# Patient Record
Sex: Male | Born: 1935 | Race: White | Hispanic: No | Smoking: Never smoker
Health system: Southern US, Community
[De-identification: ages and names within clinical notes are randomized; demographics above are authoritative.]

## PROBLEM LIST (undated history)

## (undated) DIAGNOSIS — R1310 Dysphagia, unspecified: Secondary | ICD-10-CM

## (undated) DIAGNOSIS — N182 Chronic kidney disease, stage 2 (mild): Secondary | ICD-10-CM

## (undated) DIAGNOSIS — K219 Gastro-esophageal reflux disease without esophagitis: Secondary | ICD-10-CM

## (undated) DIAGNOSIS — F32A Depression, unspecified: Secondary | ICD-10-CM

## (undated) DIAGNOSIS — I2510 Atherosclerotic heart disease of native coronary artery without angina pectoris: Secondary | ICD-10-CM

## (undated) DIAGNOSIS — F419 Anxiety disorder, unspecified: Secondary | ICD-10-CM

## (undated) DIAGNOSIS — I679 Cerebrovascular disease, unspecified: Secondary | ICD-10-CM

## (undated) DIAGNOSIS — H3530 Unspecified macular degeneration: Secondary | ICD-10-CM

## (undated) DIAGNOSIS — F0150 Vascular dementia without behavioral disturbance: Secondary | ICD-10-CM

## (undated) DIAGNOSIS — N400 Enlarged prostate without lower urinary tract symptoms: Secondary | ICD-10-CM

## (undated) DIAGNOSIS — I639 Cerebral infarction, unspecified: Secondary | ICD-10-CM

## (undated) DIAGNOSIS — E785 Hyperlipidemia, unspecified: Secondary | ICD-10-CM

## (undated) DIAGNOSIS — I10 Essential (primary) hypertension: Secondary | ICD-10-CM

## (undated) DIAGNOSIS — F329 Major depressive disorder, single episode, unspecified: Secondary | ICD-10-CM

## (undated) HISTORY — DX: Gastro-esophageal reflux disease without esophagitis: K21.9

## (undated) HISTORY — PX: TONSILLECTOMY: SUR1361

## (undated) HISTORY — PX: CORONARY ARTERY BYPASS GRAFT: SHX141

## (undated) HISTORY — DX: Cerebrovascular disease, unspecified: I67.9

## (undated) HISTORY — PX: CARDIAC SURGERY: SHX584

## (undated) HISTORY — DX: Unspecified macular degeneration: H35.30

## (undated) HISTORY — DX: Atherosclerotic heart disease of native coronary artery without angina pectoris: I25.10

## (undated) HISTORY — DX: Cerebral infarction, unspecified: I63.9

## (undated) HISTORY — DX: Vascular dementia, unspecified severity, without behavioral disturbance, psychotic disturbance, mood disturbance, and anxiety: F01.50

## (undated) HISTORY — DX: Chronic kidney disease, stage 2 (mild): N18.2

## (undated) HISTORY — DX: Essential (primary) hypertension: I10

## (undated) HISTORY — DX: Hyperlipidemia, unspecified: E78.5

## (undated) HISTORY — PX: HERNIA REPAIR: SHX51

---

## 1980-03-07 DIAGNOSIS — I2510 Atherosclerotic heart disease of native coronary artery without angina pectoris: Secondary | ICD-10-CM

## 1980-03-07 HISTORY — DX: Atherosclerotic heart disease of native coronary artery without angina pectoris: I25.10

## 2003-03-08 DIAGNOSIS — I679 Cerebrovascular disease, unspecified: Secondary | ICD-10-CM

## 2003-03-08 HISTORY — DX: Cerebrovascular disease, unspecified: I67.9

## 2005-11-16 ENCOUNTER — Encounter (INDEPENDENT_AMBULATORY_CARE_PROVIDER_SITE_OTHER): Payer: Self-pay | Admitting: Internal Medicine

## 2007-10-25 ENCOUNTER — Emergency Department (HOSPITAL_COMMUNITY): Admission: EM | Admit: 2007-10-25 | Discharge: 2007-10-25 | Payer: Self-pay | Admitting: Emergency Medicine

## 2007-11-01 ENCOUNTER — Ambulatory Visit: Payer: Self-pay | Admitting: Internal Medicine

## 2007-11-01 DIAGNOSIS — N400 Enlarged prostate without lower urinary tract symptoms: Secondary | ICD-10-CM

## 2007-11-01 DIAGNOSIS — H269 Unspecified cataract: Secondary | ICD-10-CM | POA: Insufficient documentation

## 2007-11-01 DIAGNOSIS — I252 Old myocardial infarction: Secondary | ICD-10-CM | POA: Insufficient documentation

## 2007-11-01 DIAGNOSIS — E785 Hyperlipidemia, unspecified: Secondary | ICD-10-CM | POA: Insufficient documentation

## 2007-11-01 DIAGNOSIS — K5900 Constipation, unspecified: Secondary | ICD-10-CM | POA: Insufficient documentation

## 2007-11-01 DIAGNOSIS — I10 Essential (primary) hypertension: Secondary | ICD-10-CM | POA: Insufficient documentation

## 2008-02-06 ENCOUNTER — Ambulatory Visit: Payer: Self-pay | Admitting: Internal Medicine

## 2008-02-06 DIAGNOSIS — F528 Other sexual dysfunction not due to a substance or known physiological condition: Secondary | ICD-10-CM

## 2008-02-06 DIAGNOSIS — F0150 Vascular dementia without behavioral disturbance: Secondary | ICD-10-CM

## 2008-02-06 LAB — CONVERTED CEMR LAB
Blood in Urine, dipstick: NEGATIVE
Glucose, Urine, Semiquant: NEGATIVE
Protein, U semiquant: NEGATIVE
Specific Gravity, Urine: 1.01
WBC Urine, dipstick: NEGATIVE
pH: 5

## 2008-02-11 ENCOUNTER — Encounter (INDEPENDENT_AMBULATORY_CARE_PROVIDER_SITE_OTHER): Payer: Self-pay | Admitting: Internal Medicine

## 2008-02-14 ENCOUNTER — Encounter (INDEPENDENT_AMBULATORY_CARE_PROVIDER_SITE_OTHER): Payer: Self-pay | Admitting: Internal Medicine

## 2008-02-15 ENCOUNTER — Telehealth (INDEPENDENT_AMBULATORY_CARE_PROVIDER_SITE_OTHER): Payer: Self-pay | Admitting: *Deleted

## 2008-02-27 ENCOUNTER — Encounter (INDEPENDENT_AMBULATORY_CARE_PROVIDER_SITE_OTHER): Payer: Self-pay | Admitting: *Deleted

## 2008-02-27 LAB — CONVERTED CEMR LAB
ALT: 16 units/L (ref 0–53)
AST: 17 units/L (ref 0–37)
Albumin: 4.4 g/dL (ref 3.5–5.2)
Alkaline Phosphatase: 51 units/L (ref 39–117)
CO2: 22 meq/L (ref 19–32)
Calcium: 9.7 mg/dL (ref 8.4–10.5)
Chloride: 106 meq/L (ref 96–112)
Cholesterol: 188 mg/dL (ref 0–200)
Creatinine, Ser: 1.41 mg/dL (ref 0.40–1.50)
Glucose, Bld: 102 mg/dL — ABNORMAL HIGH (ref 70–99)
LDL Cholesterol: 109 mg/dL — ABNORMAL HIGH (ref 0–99)
Total Bilirubin: 0.6 mg/dL (ref 0.3–1.2)
Triglycerides: 150 mg/dL — ABNORMAL HIGH (ref <150)
VLDL: 30 mg/dL (ref 0–40)

## 2008-03-03 ENCOUNTER — Encounter (INDEPENDENT_AMBULATORY_CARE_PROVIDER_SITE_OTHER): Payer: Self-pay | Admitting: Internal Medicine

## 2008-03-12 ENCOUNTER — Ambulatory Visit: Payer: Self-pay | Admitting: Internal Medicine

## 2008-03-12 DIAGNOSIS — R040 Epistaxis: Secondary | ICD-10-CM | POA: Insufficient documentation

## 2008-03-12 DIAGNOSIS — L259 Unspecified contact dermatitis, unspecified cause: Secondary | ICD-10-CM | POA: Insufficient documentation

## 2008-03-13 ENCOUNTER — Telehealth (INDEPENDENT_AMBULATORY_CARE_PROVIDER_SITE_OTHER): Payer: Self-pay | Admitting: Internal Medicine

## 2008-03-25 ENCOUNTER — Ambulatory Visit (HOSPITAL_COMMUNITY): Payer: Self-pay | Admitting: Psychology

## 2008-04-17 ENCOUNTER — Ambulatory Visit (HOSPITAL_COMMUNITY): Payer: Self-pay | Admitting: Psychology

## 2008-04-17 ENCOUNTER — Encounter (INDEPENDENT_AMBULATORY_CARE_PROVIDER_SITE_OTHER): Payer: Self-pay | Admitting: Internal Medicine

## 2008-04-18 LAB — CONVERTED CEMR LAB
ALT: 16 units/L (ref 0–53)
Albumin: 4.6 g/dL (ref 3.5–5.2)
Indirect Bilirubin: 0.4 mg/dL (ref 0.0–0.9)
Total Bilirubin: 0.5 mg/dL (ref 0.3–1.2)
Total Protein: 7.4 g/dL (ref 6.0–8.3)

## 2008-04-25 ENCOUNTER — Encounter (INDEPENDENT_AMBULATORY_CARE_PROVIDER_SITE_OTHER): Payer: Self-pay | Admitting: Internal Medicine

## 2008-04-29 ENCOUNTER — Encounter (INDEPENDENT_AMBULATORY_CARE_PROVIDER_SITE_OTHER): Payer: Self-pay | Admitting: Internal Medicine

## 2008-05-07 ENCOUNTER — Ambulatory Visit (HOSPITAL_COMMUNITY): Payer: Self-pay | Admitting: Psychology

## 2008-05-15 ENCOUNTER — Ambulatory Visit (HOSPITAL_COMMUNITY): Payer: Self-pay | Admitting: Psychology

## 2008-09-18 ENCOUNTER — Ambulatory Visit: Payer: Self-pay | Admitting: Cardiology

## 2009-07-04 ENCOUNTER — Observation Stay (HOSPITAL_COMMUNITY): Admission: EM | Admit: 2009-07-04 | Discharge: 2009-07-05 | Payer: Self-pay | Admitting: Emergency Medicine

## 2010-05-25 LAB — CARDIAC PANEL(CRET KIN+CKTOT+MB+TROPI)
CK, MB: 1.8 ng/mL (ref 0.3–4.0)
Troponin I: <0.01 ng/mL (ref 0.00–0.06)

## 2010-05-25 LAB — COMPREHENSIVE METABOLIC PANEL
ALT: 16 U/L (ref 0–53)
Calcium: 9.3 mg/dL (ref 8.4–10.5)
Chloride: 109 mEq/L (ref 96–112)
Creatinine, Ser: 1.95 mg/dL — ABNORMAL HIGH (ref 0.4–1.5)
GFR calc Af Amer: 40 mL/min — ABNORMAL LOW (ref >60)
GFR calc non Af Amer: 33 mL/min — ABNORMAL LOW (ref >60)
Glucose, Bld: 133 mg/dL — ABNORMAL HIGH (ref 70–99)
Sodium: 139 mEq/L (ref 135–145)

## 2010-05-25 LAB — BASIC METABOLIC PANEL
CO2: 24 mEq/L (ref 19–32)
GFR calc non Af Amer: 46 mL/min — ABNORMAL LOW (ref >60)
Sodium: 139 mEq/L (ref 135–145)

## 2010-05-25 LAB — POCT CARDIAC MARKERS
CKMB, poc: 1.5 ng/mL (ref 1.0–8.0)
CKMB, poc: 1.9 ng/mL (ref 1.0–8.0)
Myoglobin, poc: 115 ng/mL (ref 12–200)
Myoglobin, poc: 99.1 ng/mL (ref 12–200)
Troponin i, poc: <0.05 ng/mL (ref 0.00–0.09)
Troponin i, poc: <0.05 ng/mL (ref 0.00–0.09)

## 2010-05-25 LAB — CBC
Hemoglobin: 13.1 g/dL (ref 13.0–17.0)
MCHC: 35.8 g/dL (ref 30.0–36.0)
MCV: 91.7 fL (ref 78.0–100.0)
RBC: 3.98 MIL/uL — ABNORMAL LOW (ref 4.22–5.81)
RDW: 13.6 % (ref 11.5–15.5)

## 2010-05-25 LAB — DIFFERENTIAL
Basophils Relative: 0 % (ref 0–1)
Eosinophils Absolute: 0.1 10*3/uL (ref 0.0–0.7)
Lymphocytes Relative: 27 % (ref 12–46)
Lymphs Abs: 1.6 10*3/uL (ref 0.7–4.0)
Monocytes Absolute: 0.7 10*3/uL (ref 0.1–1.0)
Neutrophils Relative %: 59 % (ref 43–77)

## 2010-05-25 LAB — PROTIME-INR: INR: 1.08 (ref 0.00–1.49)

## 2010-06-26 ENCOUNTER — Inpatient Hospital Stay (HOSPITAL_COMMUNITY)
Admission: EM | Admit: 2010-06-26 | Discharge: 2010-07-02 | DRG: 234 | Disposition: A | Discharge status: Home Health | Payer: MEDICARE | Source: Other Acute Inpatient Hospital | Attending: Surgery | Admitting: Surgery

## 2010-06-26 DIAGNOSIS — D696 Thrombocytopenia, unspecified: Secondary | ICD-10-CM | POA: Diagnosis not present

## 2010-06-26 DIAGNOSIS — I214 Non-ST elevation (NSTEMI) myocardial infarction: Principal | ICD-10-CM | POA: Diagnosis present

## 2010-06-26 DIAGNOSIS — E785 Hyperlipidemia, unspecified: Secondary | ICD-10-CM | POA: Diagnosis present

## 2010-06-26 DIAGNOSIS — Z79899 Other long term (current) drug therapy: Secondary | ICD-10-CM

## 2010-06-26 DIAGNOSIS — H3530 Unspecified macular degeneration: Secondary | ICD-10-CM | POA: Diagnosis present

## 2010-06-26 DIAGNOSIS — K219 Gastro-esophageal reflux disease without esophagitis: Secondary | ICD-10-CM | POA: Diagnosis present

## 2010-06-26 DIAGNOSIS — E8779 Other fluid overload: Secondary | ICD-10-CM | POA: Diagnosis not present

## 2010-06-26 DIAGNOSIS — I129 Hypertensive chronic kidney disease with stage 1 through stage 4 chronic kidney disease, or unspecified chronic kidney disease: Secondary | ICD-10-CM | POA: Diagnosis present

## 2010-06-26 DIAGNOSIS — I2510 Atherosclerotic heart disease of native coronary artery without angina pectoris: Secondary | ICD-10-CM | POA: Diagnosis present

## 2010-06-26 DIAGNOSIS — I252 Old myocardial infarction: Secondary | ICD-10-CM

## 2010-06-26 DIAGNOSIS — Z7982 Long term (current) use of aspirin: Secondary | ICD-10-CM

## 2010-06-26 DIAGNOSIS — N182 Chronic kidney disease, stage 2 (mild): Secondary | ICD-10-CM | POA: Diagnosis present

## 2010-06-26 DIAGNOSIS — D62 Acute posthemorrhagic anemia: Secondary | ICD-10-CM | POA: Diagnosis not present

## 2010-06-26 LAB — APTT: aPTT: 119 seconds — ABNORMAL HIGH (ref 24–37)

## 2010-06-26 LAB — CARDIAC PANEL(CRET KIN+CKTOT+MB+TROPI)
Relative Index: 13.1 — ABNORMAL HIGH (ref 0.0–2.5)
Total CK: 242 U/L — ABNORMAL HIGH (ref 7–232)

## 2010-06-26 LAB — DIFFERENTIAL
Lymphocytes Relative: 37 % (ref 12–46)
Monocytes Relative: 9 % (ref 3–12)
Neutro Abs: 3.6 10*3/uL (ref 1.7–7.7)
Neutrophils Relative %: 52 % (ref 43–77)

## 2010-06-26 LAB — TSH: TSH: 1.364 u[IU]/mL (ref 0.350–4.500)

## 2010-06-26 LAB — CBC
MCH: 30.6 pg (ref 26.0–34.0)
MCHC: 34.1 g/dL (ref 30.0–36.0)
Platelets: 169 10*3/uL (ref 150–400)
RBC: 3.82 MIL/uL — ABNORMAL LOW (ref 4.22–5.81)
RDW: 13.3 % (ref 11.5–15.5)
WBC: 6.8 10*3/uL (ref 4.0–10.5)

## 2010-06-26 LAB — MRSA PCR SCREENING: MRSA by PCR: NEGATIVE

## 2010-06-26 LAB — HEPARIN LEVEL (UNFRACTIONATED): Heparin Unfractionated: 0.77 IU/mL — ABNORMAL HIGH (ref 0.30–0.70)

## 2010-06-26 LAB — HEMOGLOBIN A1C: Hgb A1c MFr Bld: 5.6 % (ref <5.7)

## 2010-06-26 LAB — BRAIN NATRIURETIC PEPTIDE: Pro B Natriuretic peptide (BNP): 114 pg/mL — ABNORMAL HIGH (ref 0.0–100.0)

## 2010-06-26 LAB — PROTIME-INR
INR: 1.04 (ref 0.00–1.49)
Prothrombin Time: 13.8 seconds (ref 11.6–15.2)

## 2010-06-27 ENCOUNTER — Inpatient Hospital Stay (HOSPITAL_COMMUNITY): Payer: MEDICARE

## 2010-06-27 DIAGNOSIS — I2510 Atherosclerotic heart disease of native coronary artery without angina pectoris: Secondary | ICD-10-CM

## 2010-06-27 LAB — BASIC METABOLIC PANEL
CO2: 23 mEq/L (ref 19–32)
Chloride: 109 mEq/L (ref 96–112)
Creatinine, Ser: 1.37 mg/dL (ref 0.4–1.5)
GFR calc Af Amer: >60 mL/min (ref >60)
Potassium: 4.2 mEq/L (ref 3.5–5.1)
Sodium: 139 mEq/L (ref 135–145)

## 2010-06-27 LAB — LIPID PANEL
Cholesterol: 124 mg/dL (ref 0–200)
HDL: 38 mg/dL — ABNORMAL LOW (ref >39)
LDL Cholesterol: 64 mg/dL (ref 0–99)
Total CHOL/HDL Ratio: 3.3 RATIO
Triglycerides: 111 mg/dL (ref <150)
VLDL: 22 mg/dL (ref 0–40)

## 2010-06-27 LAB — POCT I-STAT 3, ART BLOOD GAS (G3+)
Acid-base deficit: 2 mmol/L (ref 0.0–2.0)
Acid-base deficit: 4 mmol/L — ABNORMAL HIGH (ref 0.0–2.0)
Bicarbonate: 21.3 mEq/L (ref 20.0–24.0)
O2 Saturation: 92 %
O2 Saturation: 91 %
TCO2: 25 mmol/L (ref 0–100)
pCO2 arterial: 41.7 mmHg (ref 35.0–45.0)
pO2, Arterial: 58 mmHg — ABNORMAL LOW (ref 80.0–100.0)

## 2010-06-27 LAB — CBC
HCT: 28.7 % — ABNORMAL LOW (ref 39.0–52.0)
Hemoglobin: 10 g/dL — ABNORMAL LOW (ref 13.0–17.0)
Hemoglobin: 11 g/dL — ABNORMAL LOW (ref 13.0–17.0)
MCH: 31.2 pg (ref 26.0–34.0)
MCHC: 33.1 g/dL (ref 30.0–36.0)
MCHC: 34.8 g/dL (ref 30.0–36.0)
MCHC: 35.5 g/dL (ref 30.0–36.0)
MCV: 89.4 fL (ref 78.0–100.0)
Platelets: 174 10*3/uL (ref 150–400)
RBC: 3.75 MIL/uL — ABNORMAL LOW (ref 4.22–5.81)
RBC: 3.5 MIL/uL — ABNORMAL LOW (ref 4.22–5.81)
RDW: 13.4 % (ref 11.5–15.5)
WBC: 19.2 10*3/uL — ABNORMAL HIGH (ref 4.0–10.5)

## 2010-06-27 LAB — PROTIME-INR
INR: 1.14 (ref 0.00–1.49)
Prothrombin Time: 14.8 seconds (ref 11.6–15.2)

## 2010-06-27 LAB — HEMOGLOBIN AND HEMATOCRIT, BLOOD: Hemoglobin: 9.1 g/dL — ABNORMAL LOW (ref 13.0–17.0)

## 2010-06-27 LAB — GLUCOSE, CAPILLARY
Glucose-Capillary: 104 mg/dL — ABNORMAL HIGH (ref 70–99)
Glucose-Capillary: 102 mg/dL — ABNORMAL HIGH (ref 70–99)

## 2010-06-27 LAB — POCT I-STAT 4, (NA,K, GLUC, HGB,HCT)
HCT: 30 % — ABNORMAL LOW (ref 39.0–52.0)
Potassium: 4 mEq/L (ref 3.5–5.1)
Sodium: 137 mEq/L (ref 135–145)

## 2010-06-27 LAB — HEPARIN LEVEL (UNFRACTIONATED): Heparin Unfractionated: 0.43 IU/mL (ref 0.30–0.70)

## 2010-06-27 LAB — PLATELET COUNT: Platelets: 134 10*3/uL — ABNORMAL LOW (ref 150–400)

## 2010-06-27 LAB — ABO/RH: ABO/RH(D): O NEG

## 2010-06-28 ENCOUNTER — Inpatient Hospital Stay (HOSPITAL_COMMUNITY): Payer: MEDICARE

## 2010-06-28 DIAGNOSIS — I2510 Atherosclerotic heart disease of native coronary artery without angina pectoris: Secondary | ICD-10-CM

## 2010-06-28 LAB — CBC
MCV: 90.7 fL (ref 78.0–100.0)
Platelets: 124 10*3/uL — ABNORMAL LOW (ref 150–400)
Platelets: 116 10*3/uL — ABNORMAL LOW (ref 150–400)
RBC: 3.32 MIL/uL — ABNORMAL LOW (ref 4.22–5.81)
RDW: 13.4 % (ref 11.5–15.5)
WBC: 19.5 10*3/uL — ABNORMAL HIGH (ref 4.0–10.5)
WBC: 17.1 10*3/uL — ABNORMAL HIGH (ref 4.0–10.5)

## 2010-06-28 LAB — BASIC METABOLIC PANEL
Calcium: 7.9 mg/dL — ABNORMAL LOW (ref 8.4–10.5)
GFR calc Af Amer: >60 mL/min (ref >60)
GFR calc non Af Amer: >60 mL/min (ref >60)
Sodium: 135 mEq/L (ref 135–145)

## 2010-06-28 LAB — MAGNESIUM
Magnesium: 2.8 mg/dL — ABNORMAL HIGH (ref 1.5–2.5)
Magnesium: 2.5 mg/dL (ref 1.5–2.5)

## 2010-06-28 LAB — GLUCOSE, CAPILLARY
Glucose-Capillary: 112 mg/dL — ABNORMAL HIGH (ref 70–99)
Glucose-Capillary: 117 mg/dL — ABNORMAL HIGH (ref 70–99)
Glucose-Capillary: 119 mg/dL — ABNORMAL HIGH (ref 70–99)
Glucose-Capillary: 123 mg/dL — ABNORMAL HIGH (ref 70–99)
Glucose-Capillary: 130 mg/dL — ABNORMAL HIGH (ref 70–99)

## 2010-06-28 LAB — POCT I-STAT 3, ART BLOOD GAS (G3+)
O2 Saturation: 95 %
TCO2: 23 mmol/L (ref 0–100)
pCO2 arterial: 37.8 mmHg (ref 35.0–45.0)

## 2010-06-28 LAB — POCT I-STAT, CHEM 8
BUN: 17 mg/dL (ref 6–23)
Chloride: 103 mEq/L (ref 96–112)
HCT: 30 % — ABNORMAL LOW (ref 39.0–52.0)
Sodium: 136 mEq/L (ref 135–145)
TCO2: 23 mmol/L (ref 0–100)

## 2010-06-28 LAB — CREATININE, SERUM: GFR calc Af Amer: >60 mL/min (ref >60)

## 2010-06-29 ENCOUNTER — Inpatient Hospital Stay (HOSPITAL_COMMUNITY): Payer: MEDICARE

## 2010-06-29 LAB — POCT I-STAT 4, (NA,K, GLUC, HGB,HCT)
Glucose, Bld: 99 mg/dL (ref 70–99)
Glucose, Bld: 117 mg/dL — ABNORMAL HIGH (ref 70–99)
Glucose, Bld: 121 mg/dL — ABNORMAL HIGH (ref 70–99)
HCT: 25 % — ABNORMAL LOW (ref 39.0–52.0)
Hemoglobin: 9.2 g/dL — ABNORMAL LOW (ref 13.0–17.0)
Hemoglobin: 8.8 g/dL — ABNORMAL LOW (ref 13.0–17.0)
Hemoglobin: 8.8 g/dL — ABNORMAL LOW (ref 13.0–17.0)
Potassium: 4.4 mEq/L (ref 3.5–5.1)
Potassium: 4.7 mEq/L (ref 3.5–5.1)
Potassium: 4.7 mEq/L (ref 3.5–5.1)
Sodium: 138 mEq/L (ref 135–145)
Sodium: 133 mEq/L — ABNORMAL LOW (ref 135–145)
Sodium: 137 mEq/L (ref 135–145)

## 2010-06-29 LAB — CBC
HCT: 30.2 % — ABNORMAL LOW (ref 39.0–52.0)
Hemoglobin: 10.2 g/dL — ABNORMAL LOW (ref 13.0–17.0)
MCH: 30.9 pg (ref 26.0–34.0)
MCHC: 33.8 g/dL (ref 30.0–36.0)
MCV: 91.5 fL (ref 78.0–100.0)
RBC: 3.3 MIL/uL — ABNORMAL LOW (ref 4.22–5.81)

## 2010-06-29 LAB — BASIC METABOLIC PANEL
BUN: 16 mg/dL (ref 6–23)
CO2: 24 mEq/L (ref 19–32)
Calcium: 8.1 mg/dL — ABNORMAL LOW (ref 8.4–10.5)
Chloride: 106 mEq/L (ref 96–112)
Creatinine, Ser: 1.21 mg/dL (ref 0.4–1.5)
GFR calc Af Amer: >60 mL/min (ref >60)
Glucose, Bld: 121 mg/dL — ABNORMAL HIGH (ref 70–99)

## 2010-06-29 LAB — POCT I-STAT 3, ART BLOOD GAS (G3+)
Acid-base deficit: 3 mmol/L — ABNORMAL HIGH (ref 0.0–2.0)
Acid-base deficit: 2 mmol/L (ref 0.0–2.0)
Acid-base deficit: 3 mmol/L — ABNORMAL HIGH (ref 0.0–2.0)
Bicarbonate: 23.9 mEq/L (ref 20.0–24.0)
Bicarbonate: 21.3 mEq/L (ref 20.0–24.0)
O2 Saturation: 95 %
TCO2: 25 mmol/L (ref 0–100)
TCO2: 26 mmol/L (ref 0–100)
pH, Arterial: 7.308 — ABNORMAL LOW (ref 7.350–7.450)

## 2010-06-29 NOTE — Cardiovascular Report (Signed)
NAME:  Alex Williamson, Alex Williamson              ACCOUNT NO.:  [id number]  MEDICAL RECORD NO.:  [id number]           PATIENT TYPE:  I  LOCATION:  2906                         FACILITY:  [facility]  PHYSICIAN:  Jake Bathe, MD      DATE OF BIRTH:  07/12/1927  DATE OF PROCEDURE:  06/27/2010 DATE OF DISCHARGE:                           CARDIAC CATHETERIZATION   PROCEDURES:  Left heart catheterization, coronary angiography, and left ventriculogram.  INDICATIONS:  This is an 77 year old male with non-ST-elevation myocardial infarction, hypertension, and hyperlipidemia with prior myocardial infarction in 1982 with increasing angina this morning requiring emergent cardiac catheterization.  He received Integrilin for approximately 1 hour renal dosing prior to cardiac cath.  Informed consent was obtained.  Risk of stroke, heart attack, death, renal impairment, arterial damage, and bleeding were explained to the patient at length.  I also spoke to his son-in-law, Alex Williamson, on the phone.  Femoral head was obtained via fluoroscopy.  Lidocaine 1% was used for local anesthesia.  A 6-French sheath was inserted into the right femoral artery.  A Judkins left #5 catheter and a Judkins right #4 catheter were used selectively cannulate the coronary arteries.  An angled pigtail was used across the left ventricle and pressures were obtained.  Left ventriculogram and 25 mL of contrast was performed.  Selective coronary angiography using hand injection were performed.  FINDINGS: 1. Left main artery branches into the LAD, ramus, and circumflex     branches.  The ostial portion of the left main has minor     calcification.  Otherwise widely patent. 2. LAD - the LAD is occluded ostially and there is collateral flow     which is filling an aneurysmal proximal section of the LAD which     then fills a first diagonal branch which also has severe disease in     its 2 subsegmental branches of up to 80-90%.  The LAD then fills    via left-to-left collaterals as well as right-to-left collaterals     distally. 3. Ramus branch demonstrates 80% proximal stenosis and an aneurysmal     segment just prior to this stenotic region.  This vessel is a wide,     large-caliber vessel. 4. Circumflex - the circumflex distributes 2 obtuse marginal branches.     There are minor luminal irregularities throughout this system.  At     the bifurcation of the first obtuse marginal branch, there does     appear to be 60 to perhaps 70% stenosis in the small remaining AV     groove circ region. 5. Right coronary artery - this vessel is diffusely diseased, ectatic,     aneurysmal in its proximal and mid segments.  The posterior     descending artery is occluded in its proximal segment.  There are     right-to-left collaterals going to the heavily calcified LAD.     There is a large acute marginal branch that branches off very     proximally that has approximately 70-80% stenosis proximally. 6. Left ventriculogram:  Left ventricular ejection fraction estimated     at 45-50% with mild  anteroseptal wall hypokinesis.  No significant     mitral regurgitation detected.  HEMODYNAMICS:  Left ventricular systolic pressure 111 with an end- diastolic pressure of 21 mmHg.  Aortic pressure 111/63 with a mean of 84 mmHg.  There is no significant gradient across the aortic valve.  IMPRESSION:  Severe coronary artery disease as described above, most notable occluded ostial left anterior descending filling via left-to- left and right-to-left collaterals with a first diagonal branch comprising significant stenosis and its 2 subsegmental branches, occluded posterior descending artery off the right coronary artery, 80% lesion in the proximal ramus branch.  Ejection fraction overall is 45- 50%.  Mildly reduced.  PLAN:  Findings have been discussed with Dr. Evelene Croon of CT Surgery and we have decided to proceed with urgent bypass.  I have contacted  his son-in-law, Alex Williamson, and discussed this with him on the phone and I have also attempted to contact his daughter, Alex Williamson, but he states that she is likely at church currently.  Her cell phone went to voice mail.  We will continue to follow along with Dr. Laneta Simmers.     Jake Bathe, MD     MCS/MEDQ  D:  06/27/2010  T:  06/27/2010  Job:  [id number]  Electronically Signed by Donato Schultz MD on 06/29/2010 08:58:06 PM

## 2010-06-29 NOTE — H&P (Signed)
NAMEYAASIR, MENKEN NO.:  [id number]  MEDICAL RECORD NO.:  [id number]           PATIENT TYPE:  I  LOCATION:  2906                         FACILITY:  [facility]  PHYSICIAN:  Jake Bathe, MD      DATE OF BIRTH:  Mar 28, 1927  DATE OF ADMISSION:  06/26/2010 DATE OF DISCHARGE:                             HISTORY & PHYSICAL   PRIMARY CARE PHYSICIAN:  Mila Homer. Sudie Bailey, MD in [place], [place].  CHIEF COMPLAINT:  Chest pain/non-ST-elevation myocardial infarction.  HISTORY OF PRESENT ILLNESS:  An 77 year old male with prior myocardial infarction in 1982, with subsequent cardiac catheterization with no percutaneous intervention at that time with hypertension, hyperlipidemia, no prior tobacco history, no early family history of coronary artery disease, who presented to the emergency department at [facility] early this morning after being awakened by chest discomfort at 4 a.m., mostly categorized as right-sided chest pressure, moderate in intensity without any nausea, diaphoresis.  He did have some mild shortness of breath with the discomfort.  He had taken nitroglycerin for several years and he took up to 3 nitro and after each nitro, his pain got somewhat better but did not completely resolve.  He then took another 4 nitro and continued to have mild amount of chest discomfort.  At that time, his son-in-law brought him in to the emergency department for further evaluation.  Once they are in the ER, an EKG was performed which demonstrated sinus rhythm with possible very subtle lateral ST-segment depressions in I and AVL, fairly nonspecific upon personal review.  Chest x-ray performed showed possible atelectasis.  Per report, cardiac biomarkers were drawn and demonstrated a CK 178, MB of 23.8, and a troponin of 0.71, consistent with non-ST-elevation myocardial infarction.  BNP was 62. His hemoglobin was 12.9.  His creatinine was 1.55, mildly  elevated. Sodium was 137, potassium 3.9.  He was transferred after discussion with ER physician to [facility] CCU for further evaluation.  Once in CCU, he states that he has been feeling fine without any further chest discomfort.  Upon questioning of angina, he states that he has been dealing with this for several years and when raking in his garden for instance, he knows to stop or slow down when he has chest discomfort.  He does take long- acting nitrate isosorbide according to his home medications.  He was also taking atenolol 25 mg a day, low-dose.  Currently, chest pain free.  PAST MEDICAL HISTORY: 1. Prior myocardial infarction in 1982 - heart catheterization at that     time.  No percutaneous intervention. 2. Hyperlipidemia. 3. Hypertension. 4. Gastroesophageal reflux disease. 5. Macular degeneration.  ALLERGIES:  No known drug allergies.  MEDICATIONS: 1. He is taking ranitidine 150 mg twice a day. 2. Atenolol 25 mg a day. 3. Isosorbide mononitrate 60 mg a day. 4. Amlodipine 10 mg a day. 5. Fexofenadine 30 mg a day. 6. Aspirin 81 mg a day. 7. Iron 324 mg a day. 8. Simvastatin 40 mg a day (his simvastatin dose should be decreased     due to concomitant amlodipine use).  We will change him to  Crestor     in this hospitalization.  FAMILY HISTORY:  His father had coronary artery disease in his 22s, otherwise no early family history of coronary artery disease.  SOCIAL HISTORY:  He denies any tobacco use ever.  No alcohol.  No illicit drug use.  He lives with his daughter in [place] after moving up from [place] in 2009.  He is widowed.  REVIEW OF SYSTEMS:  He denies any bleeding, melena, nausea, vomiting, orthopnea, PND, syncope, skin, or hair changes.  Unless specified above, all other 12 review of systems is negative.  PHYSICAL EXAMINATION:  VITAL SIGNS:  Heart rate 61, respirations 16, satting 98% on 2 liters, blood pressure 107/68. GENERAL:  Alert, oriented x3,  in no acute distress, very pleasant. HEENT:  Eyes, well-perfused conjunctivae.  EOMI.  No scleral icterus. NECK:  Supple.  No lymphadenopathy.  No thyromegaly.  No carotid bruits. No JVD. CARDIOVASCULAR:  Regular rate and rhythm without any appreciable murmurs, rubs, or gallops.  Normal PMI. LUNGS:  Clear to auscultation bilaterally.  Normal respiratory effort. ABDOMEN:  Soft, nontender.  Normoactive bowel sounds.  No rebound or guarding. EXTREMITIES:  No clubbing, cyanosis, or edema.  Normal distal pulses. GU:  Deferred. RECTAL:  Deferred. NEUROLOGIC:  Nonfocal.  No tremors. SKIN:  Warm, dry, and intact.  No rashes. PSYCHIATRIC:  Normal affect.  DATA:  As described above in HPI.  Positive cardiac biomarkers.  EKG with very subtle, almost nonspecific ST-T wave changes, perhaps mild ST depression in lateral leads.  Creatinine 1.55.  ASSESSMENT AND PLAN:  An 77 year old male with non-ST-elevation myocardial infarction, hypertension, hyperlipidemia with prior MI in 1982, with previous stable angina.  1. Non-ST-elevation myocardial infarction - admitting to CCU, continue     to cycle cardiac biomarkers, on heparin drip currently.  I will     hold Plavix for the possibility of triple-vessel disease, requiring     bypass surgery.  At this point, he is anginal free and would     consider starting either nitroglycerin drip and/or Integrilin if     angina occurs.  I have discussed with him cardiac catheterization     and we will likely proceed on Monday to further delineate coronary     anatomy.  With his creatinine of 1.5, I will gently hydrate with     half normal saline at 75 mL an hour.  I will place on low-dose     metoprolol 12.5 mg p.o. b.i.d., hold for systolic blood pressure     less than 95 or heart rate less than 55. 2. Hyperlipidemia - he was taking simvastatin 40 at home.  I will     change him to Crestor 20 mg during this hospitalization.  Check     fasting lipid profile in  the morning.  He may be able to take 10 mg     of simvastatin given his concomitant amlodipine use. 3. Hypertension - I will continue amlodipine and low-dose beta-     blocker. 4. Gastroesophageal reflux disease - continue with ranitidine. 5. Angina - I will go ahead and continue his isosorbide mononitrate 60     mg once a day.  Of course, continue with aspirin.     Jake Bathe, MD     MCS/MEDQ  D:  06/26/2010  T:  06/26/2010  Job:  [id number]  cc:   Mila Homer. Sudie Bailey, M.D.  Electronically Signed by Donato Schultz MD on 06/29/2010 08:58:01 PM

## 2010-06-30 ENCOUNTER — Inpatient Hospital Stay (HOSPITAL_COMMUNITY): Payer: MEDICARE

## 2010-06-30 LAB — BASIC METABOLIC PANEL
BUN: 14 mg/dL (ref 6–23)
Calcium: 9 mg/dL (ref 8.4–10.5)
Chloride: 106 mEq/L (ref 96–112)
Creatinine, Ser: 1.21 mg/dL (ref 0.4–1.5)
GFR calc Af Amer: >60 mL/min (ref >60)
GFR calc non Af Amer: 58 mL/min — ABNORMAL LOW (ref >60)

## 2010-06-30 LAB — CBC
MCH: 30.6 pg (ref 26.0–34.0)
MCHC: 33.9 g/dL (ref 30.0–36.0)
MCV: 90.4 fL (ref 78.0–100.0)
Platelets: 113 10*3/uL — ABNORMAL LOW (ref 150–400)
RBC: 3.53 MIL/uL — ABNORMAL LOW (ref 4.22–5.81)
RDW: 13.7 % (ref 11.5–15.5)

## 2010-07-01 LAB — BASIC METABOLIC PANEL
Calcium: 9 mg/dL (ref 8.4–10.5)
Creatinine, Ser: 1.21 mg/dL (ref 0.4–1.5)
GFR calc Af Amer: >60 mL/min (ref >60)
GFR calc non Af Amer: 58 mL/min — ABNORMAL LOW (ref >60)
Sodium: 139 mEq/L (ref 135–145)

## 2010-07-01 LAB — TYPE AND SCREEN
ABO/RH(D): O NEG
Unit division: 0

## 2010-07-06 NOTE — Discharge Summary (Signed)
Alex Williamson, Alex Williamson              ACCOUNT NO.:  [id number]  MEDICAL RECORD NO.:  [id number]           PATIENT TYPE:  I  LOCATION:  2011                         FACILITY:  [facility]  PHYSICIAN:  Evelene Croon, M.D.     DATE OF BIRTH:  12-08-1927  DATE OF ADMISSION:  06/26/2010 DATE OF DISCHARGE:                              DISCHARGE SUMMARY   FINAL DIAGNOSIS:  Severe three-vessel coronary artery disease with unstable angina, status post non-ST-segment elevation myocardial infarction.  IN-HOSPITAL DIAGNOSES: 1. Acute blood loss anemia postoperatively. 2. Volume overload postoperatively. 3. Thrombocytopenia postoperatively.  SECONDARY DIAGNOSES: 1. History of myocardial infarction 1982, heart catheterization at     that time with no percutaneous intervention. 2. Hyperlipidemia. 3. Hypertension. 4. Gastroesophageal reflux disease. 5. Macular degeneration.  IN-HOSPITAL OPERATIONS AND PROCEDURES: 1. Cardiac catheterization, June 27, 2010, done by Dr. Anne Fu. 2. Emergency coronary artery bypass grafting x5 using left internal     mammary artery graft to left anterior descending coronary artery,     saphenous vein graft to medial branch of diagonal coronary artery,     saphenous vein graft to lateral branch of the diagonal coronary     artery, saphenous vein graft to intermediate coronary artery,     saphenous vein graft to posterior descending branch of the right     coronary artery.  Endoscopic vein harvesting, bilaterally.  This     was done by Dr. Laneta Simmers, on June 27, 2010.  HISTORY AND PHYSICAL AND HOSPITAL COURSE:  The patient is an 77 year old male with history of myocardial infarction dating back to 56.  He had a cardiac catheterization at that time with no percutaneous intervention done.  The patient also has a history of hypertension and hyperlipidemia.  The patient presented to the emergency room at [facility] on June 26, 2010, with chest discomfort.  EKG  done demonstrates sinus rhythm and possible very subtle lateral ST-segment depression.  Cardiac markers drawn showed CK 170, MB of 23.8, troponin of 0.71.  This was consistent with a non-ST elevation myocardial infarction.  Dr. Anne Fu, was contacted and discussed with [facility], transfer to [facility] for further treatment of the patient's non-ST elevation myocardial fraction.  For further details of patient's past medical history and physical exam, please see dictated H and P.  [name] was admitted to [facility] on June 26, 2010, with diagnoses of a non-ST elevation myocardial infarction.  On admission, the patient was chest pain free.  On June 27, 2010, the patient had 10/10 chest pain which was not relieved with nitroglycerin or morphine. He underwent emergency cardiac catheterization by Dr. Anne Fu.  This demonstrated severe coronary artery disease with a occluded ostial left anterior descending filling via left to left and right to left collaterals with the first diagonal branch, comprising significant stenosis in its two subsegmental branches, occluded posterior descending artery off the right coronary artery, 80% lesion in the proximal ramus branch.  Ejection fraction of 45-50%.  Following cardiac catheterization, Dr. Laneta Simmers, was contacted to discuss possible taking patient for emergent coronary artery bypass grafting.  Dr. Laneta Simmers, discussed with patient undergoing  emergency coronary bypass grafting. He discussed risks and benefits with the patient.  The patient acknowledged understanding and agreed to proceed.  The patient was taken to the operating room on June 27, 2010, where he underwent emergency coronary artery bypass grafting x5 using a left internal mammary artery graft to left anterior descending coronary artery, saphenous vein graft to medial branch of diagonal coronary artery, saphenous vein graft to lateral branch with diagonal  coronary artery, saphenous vein graft to intermediate coronary artery, saphenous vein graft to posterior descending branch of right coronary artery. Endoscopic vein harvesting from bilateral thighs was done.  The patient tolerated this procedure well and was transferred to the intensive care unit in stable condition.  The patient was noted to be hemodynamically stable postoperatively.  He was able to be extubated morning of postop day #1.  Post extubation, the patient was noted to be alert and oriented x4.  Neuro intact.  Postoperatively, the patient was noted to be in normal sinus rhythm.  Blood pressure was stable.  He was able to be weaned from neo.  Blood pressure noted to be maintaining.  He has been maintaining in normal sinus rhythm.  The patient was able to be started on a low-dose beta-blocker.  Vital signs continued to be followed. Blood pressure did start to increase.  He was restarted on his Norvasc. Currently, blood pressure controlled on Lopressor and Norvasc.  ACE inhibitor has not been started secondary to creatinine being borderline. This can be reevaluated as an outpatient if appropriate. Postoperatively, chest x-ray obtained day #1 noted to be stable.  The patient had minimal drainage from chest tubes and chest tubes were discontinued in routine fashion.  Followup chest x-ray, postop day #2, remained stable with bibasilar atelectasis and small effusion.  The patient was encouraged to use his incentive spirometer.  He has been able to be weaned off oxygen with O2 saturations maintaining greater than 90% on room air.  Postoperatively, the patient did have some acute blood loss anemia.  Hemoglobin and hematocrit followed closely.  This did remain stable.  The patient did not require blood transfusions postoperatively.  The patient did have some mild volume overload postoperatively.  He was started on daily diuretics.  Daily weights were followed closely.  The patient  remained 4 kg above his preoperative weight.  I will plan to continue diuretics at discharge.  The patient did have some mild thrombocytopenia postoperatively.  This was followed and remained stable.  The patient was transferred from SICU to telemetry floor on postop day #2.  He was up ambulating well with cardiac rehab.  He was tolerating diet well.  No nausea, vomiting noted.  All incisions are clean, dry, and intact and healing well.  On July 01, 2010, the patient was noted to be afebrile in normal sinus rhythm.  Blood pressure stable.  O2 sats greater than 90% on room air.  Most recent lab work shows sodium of 139, potassium 3.6, chloride 105, bicarbonate 26, BUN of 13, creatinine 1.21, glucose of 99.  White blood cell count of 11.6, hemoglobin 10.8, hematocrit 31.9, platelet count 113.  The patient is tentatively ready for discharge to home in the a.m. pending he remained stable.  FOLLOWUP APPOINTMENTS:  A followup appointment has been arranged with Dr. Sharee Pimple physician assistant for Jul 26, 2010, at 1 o'clock p.m. The patient will need to obtain PA and lateral chest x-ray 45 minutes prior to this appointment.  The patient will need to follow up  with Dr. Anne Fu, in 2 weeks.  He will need to contact the office to make these arrangements.  ACTIVITY:  Patient instructed no driving until released to do so.  No lifting over 10 pounds.  He is told to ambulate 3-4 times per day. Progress as tolerated and continue his breathing exercises.  INCISIONAL CARE:  The patient is told to shower washing his incisions using soap and water.  He is to contact the office if he develops any drainage or opening from any of his incision sites.  DIET:  The patient educated on diet, to be low-fat, low-salt.  DISCHARGE MEDICATIONS: 1. Lasix 40 mg daily x5 days. 2. Lopressor 12.5 mg b.i.d. 3. Oxycodone 5 mg 1-2 tabs q.3 hours p.r.n. pain. 4. Potassium chloride 20 mEq daily x5 days. 5. Crestor 20  mg daily. 6. Enteric-coated aspirin 325 mg daily. 7. Norvasc 10 mg daily. 8. Ferrous sulfate 325 mg daily. 9. Fexofenadine 30 mg daily. 10.Amantadine 150 mg b.i.d.     Sol Blazing, PA   ______________________________ Evelene Croon, M.D.    KMD/MEDQ  D:  07/01/2010  T:  07/02/2010  Job:  [id number]  cc:   Jake Bathe, MD  Electronically Signed by Cameron Proud PA on 07/02/2010 12:58:52 PM Electronically Signed by Evelene Croon M.D. on 07/06/2010 11:26:22 AM

## 2010-07-06 NOTE — Op Note (Signed)
Alex Williamson, Alex Williamson              ACCOUNT NO.:  [id number]  MEDICAL RECORD NO.:  [id number]           PATIENT TYPE:  I  LOCATION:  2312                         FACILITY:  [facility]  PHYSICIAN:  Evelene Croon, M.D.     DATE OF BIRTH:  Mar 30, 1927  DATE OF PROCEDURE:  06/27/2010 DATE OF DISCHARGE:                              OPERATIVE REPORT   PREOPERATIVE DIAGNOSIS:  Severe three-vessel coronary artery disease with unstable angina, status post non-ST-segment elevation myocardial infarction.  POSTOPERATIVE DIAGNOSIS:  Severe three-vessel coronary artery disease with unstable angina, status post non-ST-segment elevation myocardial infarction.  OPERATIVE PROCEDURES:  Median sternotomy, extracorporeal circulation, emergency coronary artery bypass graft surgery x5 using a left internal mammary artery graft to left anterior descending coronary, with a saphenous vein graft to the medial branch of the diagonal coronary artery, and a saphenous vein graft to the lateral branch of the diagonal coronary artery, a saphenous vein graft to the intermediate coronary artery, and a saphenous vein graft to posterior descending branch of the right coronary artery.  Endoscopic vein harvesting from both legs.  ATTENDING SURGEON:  Evelene Croon, MD  ASSISTANT:  Coral Ceo, [facility]  ANESTHESIA:  General endotracheal.  CLINICAL HISTORY:  This patient is an 77 year old gentleman with a history of hyperlipidemia and hypertension as well as prior myocardial infarction in 1982 who presented to the [facility] Emergency Room with chest pain yesterday starting around 4 in the morning.  He took about 7 nitroglycerin with some relief, but then the pain returned. This was associated with shortness of breath.  He had inferior ST elevation on his electrocardiogram and initial troponin 0.7 with a CPK- MB of 24.  He was transferred to [facility] and had no further pain yesterday.  Then this morning, he developed  recurrent chest pain with nonspecific EKG changes.  This was not resolved with nitroglycerin and he was taken to Cath Lab urgently.  Cardiac catheterization showed severe three-vessel coronary artery disease.  The LAD was occluded at its ostium with faint bridging collaterals to a diagonal branch.  The mid and distal LAD filled by collaterals from the right.  This also filled the diagonal branch.  The diagonal had 2 subbranches both of which were moderate-sized vessels.  The lateral subbranch had high-grade ostial stenosis.  The intermediate coronary artery had high-grade proximal stenosis with a small aneurysm present.  The left circumflex was nondominant vessel with a single marginal branch that had irregularities, but no significant stenosis.  The right coronary artery was a large somewhat ectatic vessel with an occluded posterior descending branch at its ostium.  There was a small posterolateral branch.  Left ventricular function was mildly depressed with anterior hypokinesis and ejection fraction of about 45%.  Given the patient's anatomy and unstable anginal symptoms, it was felt that emergent coronary artery bypass graft surgery was the best treatment.  I discussed the operative procedure with the patient in the Cath Lab including alternatives, benefits, and risks including but not limited to bleeding, blood transfusion, infection, stroke, myocardial infarction, graft failure, and death.  He understood all of this and agreed to proceed.  OPERATIVE PROCEDURE:  The patient was taken to the operating room and placed on the table in supine position.  After induction of general endotracheal anesthesia, a Foley catheter was placed in the bladder using sterile technique.  Then, the chest, abdomen, and both lower extremities were prepped and draped in the usual sterile manner.  The chest was entered through a median sternotomy incision and the pericardium was opened in the midline.   Examination of heart showed good ventricular contractility.  The ascending aorta had no palpable plaques in it.  Then, the left internal mammary artery was harvested from chest wall as a pedicle graft.  This was a medium-caliber vessel with excellent blood flow through it.  At the same time, segment of greater saphenous vein was harvested from the right leg using endoscopic vein harvest technique.  Most of this vein was too small to use.  There was one small segment in the upper part of the thigh that was suitable.  Therefore, we harvested another section of vein from the left leg using endoscopic vein harvest technique and this greater saphenous vein was somewhat larger and felt to be suitable.  Then, the patient was heparinized.  When an adequate ACT was obtained, the distal ascending aorta was cannulated using a 20-French aortic cannula for arterial inflow.  Venous outflow was achieved using a two- stage venous cannula through the right atrial appendage.  Antegrade cardioplegia and vent cannula was inserted in the aortic root.  The patient was placed on cardiopulmonary bypass and distal coronary was identified.  The LAD was a graftable vessel, but diffusely diseased distally.  The midportion was soft enough to open.  The diagonal branch had two subbranches both of which were moderate-sized graftable vessels. The intermediate was a large vessel and was identified proximally and then became intramyocardial.  Just prior to entering the muscle, it was soft and graftable.  The posterior descending was a large graftable vessel.  There was some discoloration proximally suggesting thrombus.  Then, a retrograde cardioplegic cannula was inserted through a purse- string suture in the right atrium and advanced in the coronary sinus. The aorta was then crossclamped and 400 mL of cold blood antegrade cardioplegia was administered in the aortic root with quick arrest of the heart.  This was  followed by 400 mL of cold blood retrograde cardioplegia.  This resulted in good myocardial cooling with a septal temperature around 10 degrees centigrade.  Topical hypothermia with iced saline was used.  Systemic temperature was cooled to 32 degrees centigrade.  An insulating pad was placed in the pericardium and a septal probe was used to monitor myocardial temperature.  Then, the first distal anastomosis was performed to the posterior descending coronary artery.  When this artery was opened just beyond the purplish area and the proximal vessel, there was fresh thrombus present just proximal to the arteriotomy suggesting this was the acute vessel. This thrombus appeared well fixed to wall.  The internal diameter was about 1.75 mm.  Conduit used was a segment of the greater saphenous vein and anastomosis was performed in an end-to-side manner using continuous 7-0 Prolene suture.  The flow was noted through the graft and was excellent.  Second distal anastomosis was performed to the intermediate coronary artery.  The internal diameter of this vessel was about 2.5 mm.  The conduit used was a second segment of greater saphenous vein and anastomosis was performed in an end-to-side manner using continuous 7-0 Prolene suture.  Flow was noted through  the graft and was excellent.  A third distal anastomosis was performed to the medial subbranch of the diagonal.  The internal diameter was 1.6 mm.  The conduit used was a third segment of greater saphenous vein and the anastomosis was performed in an end-to-side manner using continuous 7-0 Prolene suture. Flow was noted through the graft and was excellent.  Then, another dose of cold blood retrograde cardioplegia was given.  Fourth distal anastomosis was performed to the lateral subbranch of the diagonal artery.  The internal diameter of this vessel was 1.6 mm.  The conduit used was a fourth segment of greater saphenous vein and anastomosis  was performed in an end-to-side manner using continuous 7-0 Prolene suture.  Flow was noted through the graft and was excellent.  The fifth distal anastomosis was performed to the mid LAD.  The internal diameter here was about 1.6 mm.  The conduit used was the left internal mammary graft and this was brought to an opening of the left pericardium anterior to the phrenic nerve.  It was anastomosed to the LAD in an end- to-side manner using continuous 8-0 Prolene suture.  The pedicle was sutured to the epicardium with 6-0 Prolene sutures.  Then, another dose of cold blood retrograde cardioplegia was given.  With the crossclamp in place, the proximal anastomoses of the intermediate, posterior descending, and medial diagonal graft were performed to the mid ascending aorta in an end-to-side manner using continuous 6-0 Prolene suture.  The vein segment to the lateral subbranch of the diagonal artery was too short to reach the aorta and was therefore placed end-to-side to the other diagonal vein graft using continuous 7-0 Prolene suture.  Then, the clamp was removed from mammary pedicle.  There was rapid warming of the ventricle septum and return of spontaneous ventricular fibrillation.  Crossclamp was removed at 91 minutes and the patient spontaneously converted to sinus rhythm.  The proximal and distal anastomoses appeared hemostatic and allowed the grafts satisfactory.  Graft markers were placed around the proximal anastomoses.  Two temporary right ventricular and right atrial pacing wires placed and brought out through the skin.  When the patient rewarmed to 37 degrees centigrade, he was weaned from cardiopulmonary bypass on no inotropic agents.  Total bypass time was 112 minutes. Cardiac function appeared excellent with a cardiac output of 6 liters per minute.  Protamine was given and the venous and aortic cannulas were removed without difficulty.  Hemostasis was achieved.  Three chest  tubes were placed with a tube in the posterior pericardium, one in the left pleural space, and one in the anterior mediastinum.  Sternum was closed with double #6 stainless steel wires.  Fascia was closed with continuous #1 Vicryl suture.  Subcutaneous tissue was closed with continuous 2-0 Vicryl and the skin with 3-0 Vicryl subcuticular closure.  The lower extremity vein harvest sites were closed in layers in a similar manner. The sponge, needle, and instrument counts were correct according to the scrub nurse.  Dry sterile dressing was applied over the incisions around the chest tubes which were hooked with Pleur-Evac suction.  The patient remained hemodynamically stable and transferred to the SICU in guarded but stable condition.     Evelene Croon, M.D.     BB/MEDQ  D:  06/27/2010  T:  06/28/2010  Job:  [id number]  cc:   Jake Bathe, MD  Electronically Signed by Evelene Croon M.D. on 07/06/2010 11:26:19 AM
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
## 2010-07-23 ENCOUNTER — Other Ambulatory Visit: Payer: Self-pay | Admitting: Surgery

## 2010-07-23 DIAGNOSIS — I2510 Atherosclerotic heart disease of native coronary artery without angina pectoris: Secondary | ICD-10-CM

## 2010-07-26 ENCOUNTER — Ambulatory Visit (INDEPENDENT_AMBULATORY_CARE_PROVIDER_SITE_OTHER): Payer: Self-pay

## 2010-07-26 ENCOUNTER — Ambulatory Visit
Admission: RE | Admit: 2010-07-26 | Discharge: 2010-07-26 | Disposition: A | Discharge status: Home / Self Care | Payer: MEDICARE | Source: Ambulatory Visit | Attending: Surgery | Admitting: Surgery

## 2010-07-26 DIAGNOSIS — I2510 Atherosclerotic heart disease of native coronary artery without angina pectoris: Secondary | ICD-10-CM

## 2010-07-27 NOTE — Assessment & Plan Note (Signed)
OFFICE VISIT  Alex, Williamson DOB:  10/30/1927                                        Jul 26, 2010 CHART #:  [id number]  HISTORY:  The patient comes in today for 3-week postoperative followup. He is status post emergency coronary artery bypass grafting x5 on June 27, 2010.  His hospital course was generally uneventful and he was able to be discharged home on July 02, 2010, in good condition.  Since his discharge home, he has done very well.  He saw Dr. Anne Fu last week and was given a good checkup.  His blood pressure has been well controlled and they made no medication changes at that visit.  They will see him back in June for additional followup.  He has been walking daily and physical therapy has been coming to his house 2-3 times per week to help him with mobility.  According to his daughter, he is progressing well with ambulation and is much steadier on his feet.  The physical therapist is scheduled to see them tomorrow, and hopefully at that visit I will discharge him from their care.  His appetite has been good and he is starting to regain some of the weight he lost postsurgery.  He denies any chest pain or shortness of breath and is not taking any of the pain medication at this point.  He has been contacted by cardiac rehab at [facility], although he is unsure at this point if he wants to proceed. Overall, he has done well since his discharge home.  PHYSICAL EXAMINATION:  Vital Signs:  Blood pressure is 117/79, pulse is 84 and regular, respirations 16, O2 sat 97% on room air.  Chest:  His sternal incision and chest tube sites have healed well.  Sternum is stable to palpation.  Heart:  Regular rate and rhythm without murmurs, rubs, or gallops.  Lungs:  Clear to auscultation.  Extremities:  Lower extremities show no significant edema and bilateral lower extremity EVH sites have healed well.  IMAGING:  Chest x-ray today shows almost complete  resolution of a small left pleural effusion.  ASSESSMENT AND PLAN:  The patient is doing well status post emergency coronary artery bypass graft.  From our standpoint.  He is healing well and can be discharged from our clinic at this point.  He will follow up as directed with Dr. Anne Fu.  He may begin to increase his activity as tolerated and is to continue with sternal precautions as far as heavy lifting for full 3 months postop.  He is also encouraged to continue with cardiac rehab phase II once his physical therapy has ended.  We will be happy to see him again at any time if needed.  Coral Ceo, P.A.  GC/MEDQ  D:  07/26/2010  T:  07/27/2010  Job:  [id number]  cc:   Jake Bathe, MD Mila Homer. Sudie Bailey, M.D.

## 2010-08-06 HISTORY — PX: OTHER SURGICAL HISTORY: SHX169

## 2010-08-16 ENCOUNTER — Encounter (HOSPITAL_COMMUNITY): Payer: MEDICARE | Attending: Cardiology

## 2010-08-16 DIAGNOSIS — I2510 Atherosclerotic heart disease of native coronary artery without angina pectoris: Secondary | ICD-10-CM | POA: Insufficient documentation

## 2010-08-16 DIAGNOSIS — Z951 Presence of aortocoronary bypass graft: Secondary | ICD-10-CM | POA: Insufficient documentation

## 2010-08-16 DIAGNOSIS — Z5189 Encounter for other specified aftercare: Secondary | ICD-10-CM | POA: Insufficient documentation

## 2010-08-16 DIAGNOSIS — I252 Old myocardial infarction: Secondary | ICD-10-CM | POA: Insufficient documentation

## 2010-08-16 DIAGNOSIS — I200 Unstable angina: Secondary | ICD-10-CM | POA: Insufficient documentation

## 2010-08-18 ENCOUNTER — Encounter (HOSPITAL_COMMUNITY): Payer: MEDICARE

## 2010-08-20 ENCOUNTER — Encounter (HOSPITAL_COMMUNITY): Payer: MEDICARE

## 2010-08-23 ENCOUNTER — Other Ambulatory Visit (HOSPITAL_COMMUNITY): Payer: Self-pay | Admitting: Family Medicine

## 2010-08-23 ENCOUNTER — Encounter (HOSPITAL_COMMUNITY): Payer: MEDICARE

## 2010-08-23 DIAGNOSIS — R05 Cough: Secondary | ICD-10-CM

## 2010-08-25 ENCOUNTER — Encounter (HOSPITAL_COMMUNITY): Payer: MEDICARE

## 2010-08-26 ENCOUNTER — Ambulatory Visit (HOSPITAL_COMMUNITY)
Admission: RE | Admit: 2010-08-26 | Discharge: 2010-08-26 | Disposition: A | Discharge status: Home / Self Care | Payer: MEDICARE | Source: Ambulatory Visit | Attending: Family Medicine | Admitting: Family Medicine

## 2010-08-26 DIAGNOSIS — R059 Cough, unspecified: Secondary | ICD-10-CM | POA: Insufficient documentation

## 2010-08-26 DIAGNOSIS — Z951 Presence of aortocoronary bypass graft: Secondary | ICD-10-CM | POA: Insufficient documentation

## 2010-08-26 DIAGNOSIS — R05 Cough: Secondary | ICD-10-CM | POA: Insufficient documentation

## 2010-08-26 MED ORDER — IOHEXOL 300 MG/ML  SOLN
80.0000 mL | Freq: Once | INTRAMUSCULAR | Status: AC | PRN
  Administered 2010-08-26: 80 mL via INTRAVENOUS

## 2010-08-27 ENCOUNTER — Encounter (HOSPITAL_COMMUNITY): Payer: MEDICARE

## 2010-08-30 ENCOUNTER — Encounter (HOSPITAL_COMMUNITY): Payer: MEDICARE

## 2010-09-01 ENCOUNTER — Encounter (HOSPITAL_COMMUNITY): Payer: MEDICARE

## 2010-09-03 ENCOUNTER — Encounter (HOSPITAL_COMMUNITY): Payer: MEDICARE

## 2010-09-06 ENCOUNTER — Encounter (HOSPITAL_COMMUNITY): Payer: MEDICARE

## 2010-09-08 ENCOUNTER — Encounter (HOSPITAL_COMMUNITY): Payer: MEDICARE

## 2010-09-10 ENCOUNTER — Encounter (HOSPITAL_COMMUNITY): Payer: MEDICARE

## 2010-09-13 ENCOUNTER — Encounter (HOSPITAL_COMMUNITY): Payer: MEDICARE

## 2010-09-15 ENCOUNTER — Encounter (HOSPITAL_COMMUNITY): Payer: MEDICARE

## 2010-09-17 ENCOUNTER — Encounter (HOSPITAL_COMMUNITY): Payer: MEDICARE

## 2010-09-20 ENCOUNTER — Encounter (HOSPITAL_COMMUNITY): Payer: MEDICARE

## 2010-09-22 ENCOUNTER — Encounter (HOSPITAL_COMMUNITY): Payer: MEDICARE

## 2010-09-24 ENCOUNTER — Encounter (HOSPITAL_COMMUNITY): Payer: MEDICARE

## 2010-09-27 ENCOUNTER — Encounter (HOSPITAL_COMMUNITY): Payer: MEDICARE

## 2010-09-29 ENCOUNTER — Encounter (HOSPITAL_COMMUNITY): Payer: MEDICARE

## 2010-10-01 ENCOUNTER — Encounter (HOSPITAL_COMMUNITY): Payer: MEDICARE

## 2010-10-04 ENCOUNTER — Encounter (HOSPITAL_COMMUNITY): Payer: MEDICARE

## 2010-10-06 ENCOUNTER — Encounter (HOSPITAL_COMMUNITY): Payer: MEDICARE

## 2010-10-08 ENCOUNTER — Encounter (HOSPITAL_COMMUNITY): Payer: MEDICARE

## 2010-10-11 ENCOUNTER — Encounter: Payer: Self-pay | Admitting: Family Medicine

## 2010-10-11 ENCOUNTER — Ambulatory Visit (INDEPENDENT_AMBULATORY_CARE_PROVIDER_SITE_OTHER): Payer: MEDICARE | Admitting: Family Medicine

## 2010-10-11 ENCOUNTER — Encounter (HOSPITAL_COMMUNITY): Payer: MEDICARE

## 2010-10-11 VITALS — BP 112/74 | HR 81 | Resp 16 | Ht 70.0 in | Wt 178.4 lb

## 2010-10-11 DIAGNOSIS — R093 Abnormal sputum: Secondary | ICD-10-CM

## 2010-10-11 DIAGNOSIS — I2510 Atherosclerotic heart disease of native coronary artery without angina pectoris: Secondary | ICD-10-CM

## 2010-10-11 DIAGNOSIS — I10 Essential (primary) hypertension: Secondary | ICD-10-CM

## 2010-10-11 NOTE — Assessment & Plan Note (Signed)
Patient with large amount of sputum production. Per report chest x-ray has been negative for the past 2 weeks. Other differentials include worsening GERD versus postnasal drip or allergies. Patient will restart his nasal saline as this has helped in the past. He will continue on his H2 blocker. I will obtain a sputum culture to rule out infectious process. The patient continues to have difficulties with this after use as nasal saline and if he has a negative sputum culture I will send him to ENT to be evaluated.

## 2010-10-11 NOTE — Patient Instructions (Signed)
Try the nasal saline I will call you with sputum results  Next step is to send you to Ear nose and throat I will get records from Dr. Anne Fu and Dr. Sudie Bailey.

## 2010-10-11 NOTE — Progress Notes (Signed)
  Subjective:    Patient ID: Alex Williamson, male    DOB: 11-09-1927, 77 y.o.   MRN: [id number]  HPI   Patient presents to establish medical care. His medical history was reviewed.  Patient's only concern today is increased productive sputum. Sputum- for the past 6 months patient has had increase in sputum production. He states he spits up more than 15 times per day. Sputum is very thick in nature. He denies any blood or frank hemoptysis. He denies any cough chills or fever associated. He is a nonsmoker. He states his primary care provider previously obtain an x-ray approximately 2 weeks ago which was negative. He has been taking Zantac for some time but does not believe that his heartburn is any worse than normal. He denies any postnasal drip or sinus infection. He brought a sample today in a plastic bag for me to view.  CABG- patient underwent 5 vessel coronary bypass in May of 2012. He states he has been released by the cardiovascular surgeon as well as his cardiologist. His daughter accompanies him today and is not sure why. Patient denies any chest pain or shortness of breath. He denies any palpitations today. He is currently taking his simvastatin however dose is unknown. He is also taking his beta blocker and Imdur  Hypertension- patient is currently on toprol for hypertension. He states at times his blood pressure is very low therefore he will break his blood pressure pill in half. However he cannot tell me which medications this is.    Review of Systems  Constitutional: Negative for fever, chills and weight loss.  Respiratory: Positive for sputum production. Negative for cough, hemoptysis, shortness of breath and wheezing.   Cardiovascular: Negative for chest pain, palpitations and leg swelling.  Gastrointestinal: Positive for heartburn. Negative for nausea, vomiting and abdominal pain.  Psychiatric/Behavioral: Positive for memory loss.     Review of Systems  Constitutional: Negative  for fever, chills and weight loss.  Respiratory: Positive for sputum production. Negative for cough, hemoptysis, shortness of breath and wheezing.   Cardiovascular: Negative for chest pain, palpitations and leg swelling.  Gastrointestinal: Positive for heartburn. Negative for nausea, vomiting and abdominal pain.  Psychiatric/Behavioral: Positive for memory loss.  GU- urinary frequency, no urgency, no dribbling MSK- denies joint pain or swelling, denies back pain        Objective:   Physical Exam GEN- NAD, alert and oriented x3 HEENT-  MMM, oropharynx clear, non injected oropharynx, nares clear Neck- Supple, no carotid bruit CVS- RRR, no murmur RESP-CTAB, normal WOB, clear thick sputum expelled  EXT- No edema, wearing court appointed ankle bracelet Pulses- Radial, DP- 2+        Assessment & Plan:

## 2010-10-11 NOTE — Assessment & Plan Note (Signed)
The patient is currently on metoprolol 25 mg daily if he continues to have hypotensive episodes that are documented we'll decrease dose to 12.5 mg daily.

## 2010-10-11 NOTE — Assessment & Plan Note (Signed)
I will obtain records from cardiology to followup on whether or not patient needs to continue to be seen. I seen with a CABG less than a-year-old patient would need followup at least every 6 months. No change the current medications.

## 2010-10-13 ENCOUNTER — Encounter (HOSPITAL_COMMUNITY): Payer: MEDICARE

## 2010-10-15 ENCOUNTER — Encounter (HOSPITAL_COMMUNITY): Payer: MEDICARE

## 2010-10-15 LAB — RESPIRATORY CULTURE OR RESPIRATORY AND SPUTUM CULTURE: Organism ID, Bacteria: NORMAL

## 2010-10-18 ENCOUNTER — Encounter (HOSPITAL_COMMUNITY): Payer: MEDICARE

## 2010-10-20 ENCOUNTER — Encounter (HOSPITAL_COMMUNITY): Payer: MEDICARE

## 2010-10-22 ENCOUNTER — Encounter (HOSPITAL_COMMUNITY): Payer: MEDICARE

## 2010-10-25 ENCOUNTER — Encounter (HOSPITAL_COMMUNITY): Payer: MEDICARE

## 2010-10-27 ENCOUNTER — Encounter (HOSPITAL_COMMUNITY): Payer: MEDICARE

## 2010-10-28 ENCOUNTER — Ambulatory Visit (INDEPENDENT_AMBULATORY_CARE_PROVIDER_SITE_OTHER): Payer: MEDICARE | Admitting: Family Medicine

## 2010-10-28 ENCOUNTER — Encounter: Payer: Self-pay | Admitting: Family Medicine

## 2010-10-28 VITALS — BP 98/60 | HR 91 | Ht 69.5 in | Wt 181.0 lb

## 2010-10-28 DIAGNOSIS — I2510 Atherosclerotic heart disease of native coronary artery without angina pectoris: Secondary | ICD-10-CM

## 2010-10-28 DIAGNOSIS — M25559 Pain in unspecified hip: Secondary | ICD-10-CM

## 2010-10-28 DIAGNOSIS — M25551 Pain in right hip: Secondary | ICD-10-CM

## 2010-10-28 DIAGNOSIS — R093 Abnormal sputum: Secondary | ICD-10-CM

## 2010-10-28 MED ORDER — METOPROLOL TARTRATE 25 MG PO TABS: ORAL_TABLET | ORAL | Status: DC

## 2010-10-28 NOTE — Patient Instructions (Signed)
Continue the medications as  Instructed on this form. Start the metoprolol take 1/2 tablet daily  Use the Aleve as needed for pain- no more than 6 tablets in a day  I will send you to ENT for the sputum  I have completed your handi-cap form. Please set a time to get your flu shot I will send a referral for Cardiology  Next visit in November

## 2010-10-28 NOTE — Progress Notes (Signed)
  Subjective:    Patient ID: Alex Williamson, male    DOB: [birth year], 77 y.o.   MRN: [id number]  HPI Patient here to follow her previous visit. His concerns are his continued abnormal sputum, hip pain and medication reconciliation   Hip pain x 1 day right side-  Does not remember any particular injury, has occ low back pain when  out in the yard, no particular injury. He states he had a hip pain a few days ago. The next day he did not have any discomfort at all. He did not take any medication but does have a leave at home. He was able to walk his daily miles for exercise.  Sputum - no change in abnormal sputum production. He was use of the nasal saline however this did not help. He is also on the H2 blocker for acid reflux. He states his the consistency of oyster juice. It often gets stuck in his throat he tries to cough to get it out. He does not know if things are draining from his nose. Denies any current allergy symptoms or cold symptoms. No blood in sputum  DMV form- he states he's been legally blind for a few years. He was blind in his right eye in 2007 and lost most of the vision in his left eye one year ago. He is dependent on his children for activities involving driving . He has a DMV form today to renew  his handicap placard  CABG s/p MI in April- has not seen a heart doctor since discharge, after reviewing his notes he was seen by [facility] cardiology while inpatient however did not appear to have any discharge cardiology followup.  Medications were reconciled   Review of Systems Constitutional: Negative for fever, chills and weight loss.  Respiratory: Positive for sputum production. Negative for cough, hemoptysis, shortness of breath and wheezing.   Cardiovascular: Negative for chest pain, palpitations and leg swelling.  Gastrointestinal: Positive for heartburn. Negative for nausea, vomiting and abdominal pain.     Objective:   Physical Exam GEN-NAD,alert and oriented, minimal  vision, unable to read any words, able to see very large single letters HEENT- PERRL, MMM, oropharynx clear, non injected oropharynx , nares clear Neck - supple, no LAD CVS-RRR, no murmur RESP- CTAB EXT- Hip- normal IR/ER, No bruising on skin, non tender to palpation, spine non tender, neg SLR, motor equal bilat in lower ext  no edema      Assessment & Plan:

## 2010-10-29 ENCOUNTER — Encounter: Payer: Self-pay | Admitting: Family Medicine

## 2010-10-29 ENCOUNTER — Encounter (HOSPITAL_COMMUNITY): Payer: MEDICARE

## 2010-10-29 DIAGNOSIS — M25551 Pain in right hip: Secondary | ICD-10-CM | POA: Insufficient documentation

## 2010-10-29 NOTE — Assessment & Plan Note (Signed)
Persistent problem with sputum production. The sputum culture was negative. He's not improved with nasal saline rinses for his H2 blocker. I will send him to ENT to be evaluated if this is causing him much discomfort

## 2010-10-29 NOTE — Assessment & Plan Note (Signed)
Patient has a normal exam today he can use Aleve as needed. If this persists or exam changes would get imaging

## 2010-10-29 NOTE — Assessment & Plan Note (Addendum)
Patient is status post CABG , he has had no followup with her cardiologist. It appears he should of been on metoprolol however he did not have this in his bag of medications. As he is already has lower blood pressure I will put him on a very small dose of beta blocker secondary to his recent MI. I will send a referral to cardiology

## 2010-11-01 ENCOUNTER — Encounter (HOSPITAL_COMMUNITY): Payer: MEDICARE

## 2010-11-03 ENCOUNTER — Encounter (HOSPITAL_COMMUNITY): Payer: MEDICARE

## 2010-11-05 ENCOUNTER — Encounter (HOSPITAL_COMMUNITY): Payer: MEDICARE

## 2010-11-08 ENCOUNTER — Encounter (HOSPITAL_COMMUNITY): Payer: MEDICARE

## 2010-11-09 ENCOUNTER — Encounter: Payer: Self-pay | Admitting: Cardiology

## 2010-11-10 ENCOUNTER — Encounter: Payer: Self-pay | Admitting: Cardiology

## 2010-11-10 ENCOUNTER — Ambulatory Visit (INDEPENDENT_AMBULATORY_CARE_PROVIDER_SITE_OTHER): Payer: MEDICARE | Admitting: Cardiology

## 2010-11-10 ENCOUNTER — Encounter (HOSPITAL_COMMUNITY): Payer: MEDICARE

## 2010-11-10 DIAGNOSIS — I679 Cerebrovascular disease, unspecified: Secondary | ICD-10-CM

## 2010-11-10 DIAGNOSIS — R413 Other amnesia: Secondary | ICD-10-CM

## 2010-11-10 DIAGNOSIS — E785 Hyperlipidemia, unspecified: Secondary | ICD-10-CM

## 2010-11-10 DIAGNOSIS — H3530 Unspecified macular degeneration: Secondary | ICD-10-CM

## 2010-11-10 DIAGNOSIS — E782 Mixed hyperlipidemia: Secondary | ICD-10-CM

## 2010-11-10 DIAGNOSIS — K219 Gastro-esophageal reflux disease without esophagitis: Secondary | ICD-10-CM | POA: Insufficient documentation

## 2010-11-10 DIAGNOSIS — I10 Essential (primary) hypertension: Secondary | ICD-10-CM

## 2010-11-10 DIAGNOSIS — N182 Chronic kidney disease, stage 2 (mild): Secondary | ICD-10-CM

## 2010-11-10 DIAGNOSIS — I2510 Atherosclerotic heart disease of native coronary artery without angina pectoris: Secondary | ICD-10-CM

## 2010-11-10 DIAGNOSIS — N400 Enlarged prostate without lower urinary tract symptoms: Secondary | ICD-10-CM

## 2010-11-10 MED ORDER — RANITIDINE HCL 150 MG PO CAPS: 150.0000 mg | ORAL_CAPSULE | Freq: Every evening | ORAL | Status: DC

## 2010-11-10 NOTE — Assessment & Plan Note (Addendum)
Patient has apparently had only minor problems with gastroesophageal reflux disease.  He is not followed by a gastroenterologist, does not have much in way of symptoms, and believes that he has never had an EGD or esophageal dilatation.  To honor his desire to take as little medication as possible, ranitidine will be decreased to 150 mg q.h.s.  If he remains asymptomatic, that medication can be discontinued entirely.

## 2010-11-10 NOTE — Assessment & Plan Note (Addendum)
Patient denies significant previous hypertension and would like to be treated with as little medication as possible.  Amlodipine will be discontinued and blood pressure monitored.

## 2010-11-10 NOTE — Patient Instructions (Signed)
**Note De-Identified  Obfuscation** Your physician has recommended you make the following change in your medication: stop taking Aleve, Amlodipine and Imdur. Decrease Ranitidine to 1 tablet at bedtime  Your physician recommends that you schedule a follow-up appointment in: 6 months

## 2010-11-10 NOTE — Progress Notes (Signed)
HPI : Mr. Yaworski is seen in the office as scheduled for continued assessment and treatment of coronary disease, now 3 months following emergency CABG surgery after he presented with an acute non-ST segment elevation MI.  He has recovered very well, currently experiencing no chest discomfort nor dyspnea at low workloads.  He has been discharged from the care of cardiovascular surgery.  Lifestyle prior to surgery was excellent with no use of tobacco products, no diabetes, an active lifestyle and a proportionate height and weight.  Current Outpatient Prescriptions on File Prior to Visit  Medication Sig Dispense Refill  . aspirin 81 MG tablet Take 81 mg by mouth daily.        . calcium carbonate (TUMS - DOSED IN MG ELEMENTAL CALCIUM) 500 MG chewable tablet Chew 1 tablet by mouth at bedtime as needed.        . doxylamine, Sleep, (UNISOM) 25 MG tablet Take 25 mg by mouth at bedtime as needed.        . metoprolol tartrate (LOPRESSOR) 25 MG tablet Take 1/2 tablet daily for heart  30 tablet  3  . Multiple Vitamin (MULTIVITAMIN) tablet Take 1 tablet by mouth daily.        . simvastatin (ZOCOR) 40 MG tablet Take 40 mg by mouth at bedtime.        Marland Kitchen DISCONTD: ranitidine (ZANTAC) 150 MG capsule Take 150 mg by mouth 2 (two) times daily.           No Known Allergies    Past medical history, social history, and family history reviewed and updated.  ROS: Denies orthopnea, PND, lightheadedness, syncope, cough, sputum production.  PHYSICAL EXAM: BP 128/78  Pulse 62  Resp 18  Ht 5\' 11"  (1.803 m)  Wt 83.462 kg (184 lb)  BMI 25.66 kg/m2  SpO2 96%  General-Well developed; no acute distress Body habitus-proportionate weight and height Neck-No JVD; no carotid bruits Lungs-a few left basilar rales; resonant to percussion; no sternal tenderness; no instability. Cardiovascular-normal PMI; normal S1 and S2; modest systolic ejection murmur Abdomen-normal bowel sounds; soft and non-tender without masses or  organomegaly Musculoskeletal-No deformities, no cyanosis or clubbing Neurologic-Normal cranial nerves; symmetric strength and tone Skin-Warm, no significant lesions Extremities-distal pulses intact; no edema  EKG: Normal sinus rhythm; borderline left atrial abnormality; comparison with prior tracing performed 09/18/08, nonspecific ST-T wave abnormalities no longer noted.  ASSESSMENT AND PLAN:

## 2010-11-10 NOTE — Assessment & Plan Note (Signed)
Adequate control under therapy with simvastatin, which will be continued now that amlodipine has been discontinued.

## 2010-11-10 NOTE — Assessment & Plan Note (Addendum)
Patient is asymptomatic 3 months following CABG surgery.  A benign course for coronary disease is likely for this elderly gentleman with apparently good operative result and few vascular risk factors.  Isosorbide mononitrate is providing no benefit and will be discontinued.  Metoprolol dosage is low; however, bradycardia precludes an increase in dosage.

## 2010-11-12 ENCOUNTER — Encounter (HOSPITAL_COMMUNITY): Payer: MEDICARE

## 2010-11-15 ENCOUNTER — Encounter (HOSPITAL_COMMUNITY): Payer: MEDICARE

## 2010-11-17 ENCOUNTER — Encounter (HOSPITAL_COMMUNITY): Payer: MEDICARE

## 2010-11-19 ENCOUNTER — Encounter (HOSPITAL_COMMUNITY): Payer: MEDICARE

## 2010-11-22 ENCOUNTER — Encounter (HOSPITAL_COMMUNITY): Payer: MEDICARE

## 2010-11-24 ENCOUNTER — Encounter (HOSPITAL_COMMUNITY): Payer: MEDICARE

## 2010-11-25 ENCOUNTER — Ambulatory Visit (INDEPENDENT_AMBULATORY_CARE_PROVIDER_SITE_OTHER): Payer: MEDICARE | Admitting: Otolaryngology

## 2010-11-25 DIAGNOSIS — R070 Pain in throat: Secondary | ICD-10-CM

## 2010-11-26 ENCOUNTER — Encounter (HOSPITAL_COMMUNITY): Payer: MEDICARE

## 2010-11-29 ENCOUNTER — Encounter (HOSPITAL_COMMUNITY): Payer: MEDICARE

## 2010-12-17 ENCOUNTER — Encounter: Payer: Self-pay | Admitting: Family Medicine

## 2010-12-17 ENCOUNTER — Ambulatory Visit (INDEPENDENT_AMBULATORY_CARE_PROVIDER_SITE_OTHER): Payer: MEDICARE | Admitting: Family Medicine

## 2010-12-17 VITALS — BP 106/78 | HR 100 | Resp 16 | Ht 71.0 in | Wt 183.1 lb

## 2010-12-17 DIAGNOSIS — I10 Essential (primary) hypertension: Secondary | ICD-10-CM

## 2010-12-17 DIAGNOSIS — K219 Gastro-esophageal reflux disease without esophagitis: Secondary | ICD-10-CM

## 2010-12-17 DIAGNOSIS — R093 Abnormal sputum: Secondary | ICD-10-CM

## 2010-12-17 MED ORDER — LORATADINE 10 MG PO TABS: 10.0000 mg | ORAL_TABLET | Freq: Every day | ORAL | Status: DC

## 2010-12-17 NOTE — Progress Notes (Signed)
  Subjective:    Patient ID: Alex Williamson, male    DOB: [birth year], 77 y.o.   MRN: [id number]  HPI Sputum- continued increased sputum production, seen by ENT, put on majic mouthwash and mucinex, he states it did not help, reviewed ENT note,pt does not want to f/u with ENT at this time since there was no change. Recently he felt like his food may be getting stuck, this improved with cutting meat into smaller pieces, he does not want to pursue GI at this time.   Heartburn- taking H2 blocker at bedtime, states this helps  HTN- BP stable, norvasc stopped, currently on low dose beta blocker.  CAD- reviewed cardiology note and meds discontinued  Pt could not tell me his meds, he has visual problems, he states he does not trust his daughter to fix his med bottles   Review of Systems  GEN- denies fatigue, fever, weight loss,weakness, recent illness CVS- denies chest pain, palpitations RESP- denies SOB, cough, wheeze ABD- denies N/V, change in stools, abd pain, occ heartburn GU- denies dysuria, hematuria, dribbling, incontinence        Objective:   Physical Exam GEN-NAD, alert and oriented x 2 HEENT- PERRL, EOMI, oropharynx clear, TM clear bilat CVS- RRR, no murmur RESP- CTAB Ext- no edema      Assessment & Plan:   Will set up [facility] nurse to help with meds

## 2010-12-17 NOTE — Patient Instructions (Signed)
Bring your medications to the next visit I will send a nurse to your house to look at your medications  Continue current medications Stop the benadryl Use the Claritin  If you continue to have problems with the sputum you can schedule  A recheck with Dr. Suszanne Conners, Ear nose and Throat  If you have problems with chewing your food or things are getting stuck please call me and I will send a referral to the Gastroenterologist.  Next visit in 4 months

## 2010-12-19 DIAGNOSIS — R093 Abnormal sputum: Secondary | ICD-10-CM | POA: Insufficient documentation

## 2010-12-19 NOTE — Assessment & Plan Note (Signed)
Controlled on low dose Beta blocker

## 2010-12-19 NOTE — Assessment & Plan Note (Signed)
This is a chronic problem, pt does not want to increase meds, consider changing to PPI, but he does not want a lot of intervention at this time.

## 2010-12-19 NOTE — Assessment & Plan Note (Signed)
Continue zantac, pt does not want to pursue GI for mild dysphagia

## 2011-01-06 ENCOUNTER — Ambulatory Visit (INDEPENDENT_AMBULATORY_CARE_PROVIDER_SITE_OTHER): Payer: MEDICARE | Admitting: Otolaryngology

## 2011-01-12 ENCOUNTER — Telehealth: Payer: Self-pay | Admitting: Family Medicine

## 2011-01-12 NOTE — Telephone Encounter (Signed)
Have you made one or do you need me to do a referral?

## 2011-01-16 NOTE — Telephone Encounter (Signed)
Yes, at my last visit with him, I told him he needed a [facility] nurse, the documentation is in the note and in his AVS. Reason- use multiple co-morbidites and vision problems.

## 2011-01-21 NOTE — Telephone Encounter (Signed)
[place] to see him

## 2011-01-26 ENCOUNTER — Encounter: Payer: Self-pay | Admitting: Family Medicine

## 2011-01-31 ENCOUNTER — Ambulatory Visit (INDEPENDENT_AMBULATORY_CARE_PROVIDER_SITE_OTHER): Payer: MEDICARE | Admitting: Family Medicine

## 2011-01-31 VITALS — BP 96/58 | HR 67 | Resp 18 | Ht 71.0 in | Wt 183.0 lb

## 2011-01-31 DIAGNOSIS — R5381 Other malaise: Secondary | ICD-10-CM

## 2011-01-31 DIAGNOSIS — R7301 Impaired fasting glucose: Secondary | ICD-10-CM

## 2011-01-31 DIAGNOSIS — I10 Essential (primary) hypertension: Secondary | ICD-10-CM

## 2011-01-31 DIAGNOSIS — J329 Chronic sinusitis, unspecified: Secondary | ICD-10-CM

## 2011-01-31 DIAGNOSIS — E785 Hyperlipidemia, unspecified: Secondary | ICD-10-CM

## 2011-01-31 DIAGNOSIS — J40 Bronchitis, not specified as acute or chronic: Secondary | ICD-10-CM

## 2011-01-31 MED ORDER — BENZONATATE 100 MG PO CAPS: 100.0000 mg | ORAL_CAPSULE | Freq: Four times a day (QID) | ORAL | Status: DC | PRN

## 2011-01-31 MED ORDER — DOXYCYCLINE HYCLATE 100 MG PO TABS: 100.0000 mg | ORAL_TABLET | Freq: Two times a day (BID) | ORAL | Status: DC

## 2011-01-31 NOTE — Progress Notes (Signed)
  Subjective:    Patient ID: Alex Williamson, male    DOB: [birth year], 77 y.o.   MRN: [id number]  HPI 3 day h/o stuffy nose , yellow drainage  , sore throat and some chest congestion , sputum is yellow, has had cxr and sputum testing both were negative. Also has ssen ENT also. Has been having confusion with  Medications, and himself and his daughter will re address this with the home health nurse at next visit     Review of Systems See HPI  Denies chest pains, palpitations and leg swelling Denies abdominal pain, nausea, vomiting,diarrhea or constipation.   Denies dysuria, frequency, hesitancy or incontinence.  Denies headaches, seizures, numbness, or tingling. Denies depression, anxiety or insomnia. Denies skin break down or rash.        Objective:   Physical Exam Patient alert and oriented and in no cardiopulmonary distress.  HEENT:Maxillary sinus tenderness with bilateral anterior cervical adenopathy, oropharynx pink and moist, no jVD  Chest: Decreased air entry, bilateral crackles, no wheezes  CVS: S1, S2 no murmurs, no S3.  ABD: Soft non tender. Bowel sounds normal.  Ext: No edema  MS: Adequate ROM spine, shoulders, hips and knees.  Skin: Intact, no ulcerations or rash noted.  Psych: Good eye contact, normal affect. Memory intact not anxious or depressed appearing.  CNS: CN 2-12 intact, power, tone and sensation normal throughout.        Assessment & Plan:

## 2011-01-31 NOTE — Patient Instructions (Signed)
F/U in 3 month with Dr Jeanice Lim   Fasting labs this week  Medication is sent in for sinusitis and bronchitis

## 2011-02-01 DIAGNOSIS — H3530 Unspecified macular degeneration: Secondary | ICD-10-CM

## 2011-02-01 DIAGNOSIS — M6281 Muscle weakness (generalized): Secondary | ICD-10-CM

## 2011-02-01 DIAGNOSIS — I7090 Unspecified atherosclerosis: Secondary | ICD-10-CM

## 2011-02-01 DIAGNOSIS — H5410 Blindness, one eye, low vision other eye, unspecified eyes: Secondary | ICD-10-CM

## 2011-02-01 MED ORDER — BENZONATATE 100 MG PO CAPS: 100.0000 mg | ORAL_CAPSULE | Freq: Four times a day (QID) | ORAL | Status: DC | PRN

## 2011-02-01 MED ORDER — DOXYCYCLINE HYCLATE 100 MG PO TABS: 100.0000 mg | ORAL_TABLET | Freq: Two times a day (BID) | ORAL | Status: AC

## 2011-02-03 LAB — CBC WITH DIFFERENTIAL/PLATELET
Basophils Absolute: 0 10*3/uL (ref 0.0–0.1)
HCT: 45.6 % (ref 39.0–52.0)
Hemoglobin: 15.7 g/dL (ref 13.0–17.0)
Lymphocytes Relative: 39 % (ref 12–46)
Monocytes Absolute: 0.8 10*3/uL (ref 0.1–1.0)
Neutro Abs: 3.4 10*3/uL (ref 1.7–7.7)
Neutrophils Relative %: 48 % (ref 43–77)
RDW: 13.8 % (ref 11.5–15.5)
WBC: 7.1 10*3/uL (ref 4.0–10.5)

## 2011-02-03 LAB — HEPATIC FUNCTION PANEL
ALT: 14 U/L (ref 0–53)
Bilirubin, Direct: 0.1 mg/dL (ref 0.0–0.3)
Indirect Bilirubin: 0.4 mg/dL (ref 0.0–0.9)

## 2011-02-03 LAB — BASIC METABOLIC PANEL
Chloride: 106 mEq/L (ref 96–112)
Creat: 1.48 mg/dL — ABNORMAL HIGH (ref 0.50–1.35)
Potassium: 4.7 mEq/L (ref 3.5–5.3)

## 2011-02-03 LAB — LIPID PANEL
Cholesterol: 181 mg/dL (ref 0–200)
VLDL: 46 mg/dL — ABNORMAL HIGH (ref 0–40)

## 2011-02-04 LAB — HEMOGLOBIN A1C
Hgb A1c MFr Bld: 5.5 % (ref <5.7)
Mean Plasma Glucose: 111 mg/dL (ref <117)

## 2011-02-06 ENCOUNTER — Encounter: Payer: Self-pay | Admitting: Family Medicine

## 2011-02-06 NOTE — Assessment & Plan Note (Signed)
Acute infection , 10 day course of antibiotic and decongestants prescribed

## 2011-02-06 NOTE — Assessment & Plan Note (Signed)
Acute infection , will treat with antibiotic course

## 2011-02-06 NOTE — Assessment & Plan Note (Signed)
Controlled, no change in medication  

## 2011-02-09 ENCOUNTER — Ambulatory Visit (INDEPENDENT_AMBULATORY_CARE_PROVIDER_SITE_OTHER): Payer: MEDICARE | Admitting: Family Medicine

## 2011-02-09 ENCOUNTER — Encounter: Payer: Self-pay | Admitting: Family Medicine

## 2011-02-09 DIAGNOSIS — F411 Generalized anxiety disorder: Secondary | ICD-10-CM

## 2011-02-09 DIAGNOSIS — F329 Major depressive disorder, single episode, unspecified: Secondary | ICD-10-CM

## 2011-02-09 DIAGNOSIS — I10 Essential (primary) hypertension: Secondary | ICD-10-CM

## 2011-02-09 DIAGNOSIS — F32A Depression, unspecified: Secondary | ICD-10-CM | POA: Insufficient documentation

## 2011-02-09 DIAGNOSIS — E785 Hyperlipidemia, unspecified: Secondary | ICD-10-CM

## 2011-02-09 DIAGNOSIS — J329 Chronic sinusitis, unspecified: Secondary | ICD-10-CM

## 2011-02-09 MED ORDER — FLUTICASONE PROPIONATE 50 MCG/ACT NA SUSP: 2.0000 | Freq: Every day | NASAL | Status: DC

## 2011-02-09 MED ORDER — MIRTAZAPINE 15 MG PO TABS: 15.0000 mg | ORAL_TABLET | Freq: Every day | ORAL | Status: AC

## 2011-02-09 NOTE — Patient Instructions (Signed)
F/u in 2 months with Dr Jeanice Lim  New medication at bedtime for anxiety and depression which will also help with sleep, remeron.  New medication for allergies a nasasl spray use every day, this will help with drainage at the back of the throat

## 2011-02-10 ENCOUNTER — Ambulatory Visit (INDEPENDENT_AMBULATORY_CARE_PROVIDER_SITE_OTHER): Payer: MEDICARE | Admitting: Otolaryngology

## 2011-02-14 NOTE — Assessment & Plan Note (Signed)
Controlled, no change in medication  

## 2011-02-14 NOTE — Progress Notes (Signed)
  Subjective:    Patient ID: Alex Williamson, male    DOB: [birth year], 77 y.o.   MRN: [id number]  HPI Pt in again with his  Daughter in less than a 2 week with complaint of increased , anxiety , depression , poor sleep. Daughter had also left  A letter before visit expressing her concerns. States her father is frustrated by his loss of vision, attempts to do things he is unable , repeats himself often, and constantly c/o excessive post nasal drainage which makes him feel as though he is drowning in his saliva.   Review of Systems See HPI Denies recent fever or chills.  Denies chest congestion, productive cough or wheezing. Denies chest pains, palpitations and leg swelling Denies abdominal pain, nausea, vomiting,diarrhea or constipation.   Denies dysuria, frequency, hesitancy or incontinence.  Denies headaches, seizures, numbness, or tingling.  Denies skin break down or rash.        Objective:   Physical Exam Patient alert and oriented and in no cardiopulmonary distress.  HEENT: No facial asymmetry, EOMI, no sinus tenderness,  oropharynx pink and moist.  Neck supple no adenopathy.  Chest: Clear to auscultation bilaterally.  CVS: S1, S2 no murmurs, no S3.  ABD: Soft non tender. Bowel sounds normal. Ext: No edema  MS: Adequate ROM spine, shoulders, hips and knees.  Skin: Intact, no ulcerations or rash noted.  Psych: Good eye contact, normal affect. Memory impaired mildly  anxious and depressed appearing.  CNS: CN 2-12 intact, power, tone and sensation normal throughout.        Assessment & Plan:

## 2011-02-14 NOTE — Assessment & Plan Note (Signed)
Uncontrolled post nasal drainage, trial of steroid nasal spray for symptom control

## 2011-02-14 NOTE — Assessment & Plan Note (Signed)
Uncontrolled and worsening symptoms with poor sleep, trial of remeron

## 2011-02-14 NOTE — Assessment & Plan Note (Signed)
Elevated triglycerides, low fat diet discussed and encouraged, no med changes at this time

## 2011-02-22 ENCOUNTER — Telehealth: Payer: Self-pay | Admitting: Family Medicine

## 2011-02-22 NOTE — Telephone Encounter (Signed)
Calling on behalf of father. Was given med at last visit for anxiety. (Remeron) 2 days he started having mood swings and increased frustration and he states "he was having a show playing in his head but he wasn't asleep". Thinks its also keeping him awake because the other night he stayed up until 3 am. He wasn't doing this before starting the med. Advised not to give it to him anymore until she heard back from [place]. What do you recommend?

## 2011-02-23 NOTE — Telephone Encounter (Signed)
Daughter is aware and gave phone number for [facility]. Sallie to reschedule him for Dr Rhona Raider return

## 2011-02-23 NOTE — Telephone Encounter (Signed)
Called his daughter and left message

## 2011-02-23 NOTE — Telephone Encounter (Signed)
I recommend no more of that medication and mental health evaluation to help with medication management, for mood swings, depression and insomnia.  [facility] is the local resource available, behav health psych is not seeing new pts . Alternatively , she may want to wait until PCP, D Lovington returns , to see if she is able to work with him on this.

## 2011-02-24 ENCOUNTER — Ambulatory Visit: Payer: MEDICARE | Admitting: Family Medicine

## 2011-02-24 ENCOUNTER — Ambulatory Visit (INDEPENDENT_AMBULATORY_CARE_PROVIDER_SITE_OTHER): Payer: MEDICARE | Admitting: Psychology

## 2011-02-24 DIAGNOSIS — F29 Unspecified psychosis not due to a substance or known physiological condition: Secondary | ICD-10-CM

## 2011-02-24 DIAGNOSIS — F09 Unspecified mental disorder due to known physiological condition: Secondary | ICD-10-CM

## 2011-03-15 ENCOUNTER — Encounter: Payer: Self-pay | Admitting: Family Medicine

## 2011-03-15 ENCOUNTER — Ambulatory Visit (INDEPENDENT_AMBULATORY_CARE_PROVIDER_SITE_OTHER): Payer: MEDICARE | Admitting: Family Medicine

## 2011-03-15 VITALS — BP 118/82 | HR 69 | Resp 16 | Ht 71.0 in | Wt 189.1 lb

## 2011-03-15 DIAGNOSIS — I679 Cerebrovascular disease, unspecified: Secondary | ICD-10-CM

## 2011-03-15 DIAGNOSIS — I2510 Atherosclerotic heart disease of native coronary artery without angina pectoris: Secondary | ICD-10-CM

## 2011-03-15 DIAGNOSIS — I10 Essential (primary) hypertension: Secondary | ICD-10-CM

## 2011-03-15 DIAGNOSIS — F329 Major depressive disorder, single episode, unspecified: Secondary | ICD-10-CM

## 2011-03-15 DIAGNOSIS — F32A Depression, unspecified: Secondary | ICD-10-CM

## 2011-03-15 DIAGNOSIS — R1310 Dysphagia, unspecified: Secondary | ICD-10-CM

## 2011-03-15 DIAGNOSIS — F3289 Other specified depressive episodes: Secondary | ICD-10-CM

## 2011-03-15 MED ORDER — METOPROLOL TARTRATE 25 MG PO TABS: ORAL_TABLET | ORAL | Status: DC

## 2011-03-15 MED ORDER — QUETIAPINE FUMARATE 25 MG PO TABS: 25.0000 mg | ORAL_TABLET | Freq: Every day | ORAL | Status: DC

## 2011-03-15 MED ORDER — RANITIDINE HCL 150 MG PO CAPS: 150.0000 mg | ORAL_CAPSULE | Freq: Two times a day (BID) | ORAL | Status: DC

## 2011-03-15 NOTE — Patient Instructions (Addendum)
For your mood/sleep start the seroquel at bedtime Increase your reflux medication to twice a day We will call with the psychiatry appointment Continue your other medications I will send you to the GI doctor to look at your swallowing F/U in 2 weeks

## 2011-03-16 ENCOUNTER — Telehealth: Payer: Self-pay | Admitting: Family Medicine

## 2011-03-16 DIAGNOSIS — R1310 Dysphagia, unspecified: Secondary | ICD-10-CM | POA: Insufficient documentation

## 2011-03-16 MED ORDER — TRAZODONE HCL 50 MG PO TABS: 50.0000 mg | ORAL_TABLET | Freq: Every day | ORAL | Status: DC

## 2011-03-16 NOTE — Assessment & Plan Note (Signed)
Patient has no new focal deficits. There is concern that he did have a TIA and as prescribed by his daughter it is very likely. We'll continue to work on his risk factors as tolerated as he is elderly male. No imaging at this time as this would not change management.

## 2011-03-16 NOTE — Assessment & Plan Note (Signed)
Patient has been seen by ENT for abnormal sputum production. He has had no evidence of infection he has had trials inhaled steroids and other medications which have not helped. Now describing dysphasia. Will refer to GI

## 2011-03-16 NOTE — Telephone Encounter (Signed)
He does not need klonopin. Will switch to Trazodone at night which is cheaper. This is not the best drug for an elderly pt.

## 2011-03-16 NOTE — Assessment & Plan Note (Signed)
Well controlled continue low dose BB Pt has HH nurse

## 2011-03-16 NOTE — Assessment & Plan Note (Signed)
I believe patient's mood disorder is more of a depressed affect instead of anxiety. It is unclear the Remeron did cause his symptoms or if this was just in the setting of his TIA event. I will give a trial of low-dose Seroquel. His daughter would like for him to speak with a psychiatrist and this is pending.

## 2011-03-16 NOTE — Assessment & Plan Note (Signed)
No recent chest pain. Patient will followup with cardiology in the next 3 months.

## 2011-03-16 NOTE — Progress Notes (Signed)
  Subjective:    Patient ID: Alex Williamson, male    DOB: 12-26-1927, 78 y.o.   MRN: [id number]  HPI GERD- would like PPI increased to twice a day, he has been doing this already at home to help his symptoms and feels this works better.  Sputum- he continues to have a lot of sputum which he has been seen by ENT in the past. He does now complain of some dysphagia with food getting stuck unless he chews very carefully, especially meat  Anxiety- her previous visit he was his daughter was concerned about anxiety and his sleeping habits. She does not feel his anxiety is a problem at this time she thought this was likely secondary to the TIA that he had a few weeks ago. He sleeps approximately 6-8 hours a night. While he does not feel anxious he has been very tearful when thinking about the past. He also feels he is becoming more frustrated with himself and his surroundings at times. His daughter denies any verbal or physical assault.  TIA- approx 2 weeks ago pt and daughter described an event where he was unable to concentrate, he was slurring his speech and his face faced drooped some. This event lasted probably 20 minutes and then resolved. He does note for possibly 4 days after the event he had vivid drains and initially thought this was due to the new medication Remeron which was started. He is still very emotional and has difficulty with math which he did not have prior to his TIA. Daughter states that the TIA was very similar to the one diagnosed last year. Patient did not seek medical care because symptoms resolved quickly.    POA process currently in works- Son [employer] [phone]   Review of Systems  GEN- denies fatigue, fever, weight loss,weakness, recent illness CVS- denies chest pain, palpitations RESP- denies SOB, cough, wheeze ABD- denies N/V, change in stools, abd pain MSK- denies joint pain, muscle aches, injury Neuro- denies headache, +mild dizziness , syncope, seizure  activity  Psych- no SI, no hallucinations     Objective:   Physical Exam GEN- NAD, alert and oriented x3 Neck- Supple, no thryomegaly CVS- RRR, no murmur RESP-CTAB EXT- No edema Pulses- Radial, DP- 2+ Psych- tearing up during exam, not overly anxious, no apparent hallucinations, no aggressive behavior Neuro- motor equal bilat, sensation in tact, dtr symmetric CNII-XII in tact, decreased vision, no slurred speech, normal mentation      Assessment & Plan:

## 2011-03-17 NOTE — Telephone Encounter (Signed)
Patient aware.

## 2011-03-29 ENCOUNTER — Ambulatory Visit (INDEPENDENT_AMBULATORY_CARE_PROVIDER_SITE_OTHER): Payer: MEDICARE | Admitting: Psychology

## 2011-03-29 DIAGNOSIS — F09 Unspecified mental disorder due to known physiological condition: Secondary | ICD-10-CM

## 2011-03-29 DIAGNOSIS — F0151 Vascular dementia with behavioral disturbance: Secondary | ICD-10-CM

## 2011-03-29 DIAGNOSIS — I679 Cerebrovascular disease, unspecified: Secondary | ICD-10-CM

## 2011-03-29 DIAGNOSIS — F0152 Vascular dementia, unspecified severity, with psychotic disturbance: Secondary | ICD-10-CM

## 2011-03-30 ENCOUNTER — Encounter: Payer: Self-pay | Admitting: Family Medicine

## 2011-03-30 ENCOUNTER — Ambulatory Visit (INDEPENDENT_AMBULATORY_CARE_PROVIDER_SITE_OTHER): Payer: MEDICARE | Admitting: Family Medicine

## 2011-03-30 VITALS — BP 112/78 | HR 66 | Resp 16 | Ht 71.0 in | Wt 190.0 lb

## 2011-03-30 DIAGNOSIS — F329 Major depressive disorder, single episode, unspecified: Secondary | ICD-10-CM

## 2011-03-30 DIAGNOSIS — F3289 Other specified depressive episodes: Secondary | ICD-10-CM

## 2011-03-30 DIAGNOSIS — F32A Depression, unspecified: Secondary | ICD-10-CM

## 2011-03-30 NOTE — Patient Instructions (Signed)
Continue the Trazadone at bedtime Please have Alex Williamson call me about your medications Please call if you have not heard from Gastroenterology Please continue to follow-up with Psych as directed F/U in 3 months for regular visit- schedule in an afternoon spot

## 2011-03-30 NOTE — Progress Notes (Signed)
  Subjective:    Patient ID: Alex Williamson, male    DOB: 1927/06/30, 78 y.o.   MRN: [id number]  HPI   Pt here to follow up sleep and mood, he was seen by psych yesterday but does not recall any particulars from the visit. He did not bring his meds and his daughter is not here today. He does not know if he is taking trazadone but states his sleep and mood are fine. He does not feel depressed or anxious and does not think he needs help. He states he feels safe at home and his daughter([name]) helps a lot, he does state she is Bipolar and her mood changes a lot, her husband is an alcoholic and he has a nephew who is an alcoholic but denies any abuse from them. He enjoys living with [name] at this time and feels he is is cared for to the best of their ability.   Review of Systems - per above     Objective:   Physical Exam  GEN-NAD, alert and oriented Psych- Not depressed appearing, not overly anxious, normal speech, did not cry during our visit, mentation appropriate      Assessment & Plan:

## 2011-03-30 NOTE — Assessment & Plan Note (Signed)
Pt feels is doing well and does not need meds or any help. He does not want to follow-up with psych. He does not know if he is taking the trazadone at bedtime, but states the seroquel was too expensive. I will await psych note. Will f/u with his daughter for more information. No change to meds

## 2011-03-31 ENCOUNTER — Telehealth: Payer: Self-pay

## 2011-03-31 NOTE — Telephone Encounter (Signed)
I spoke with pt daughter,he is doing well on trazadone, had evaluation by psych, she does not know the results of the evaluation, she will call tomorrow and have them send me the note. Otherwise he is doing okay

## 2011-04-12 ENCOUNTER — Ambulatory Visit (HOSPITAL_COMMUNITY): Payer: MEDICARE | Admitting: Psychology

## 2011-05-02 ENCOUNTER — Encounter: Payer: Self-pay | Admitting: Family Medicine

## 2011-05-02 ENCOUNTER — Ambulatory Visit (INDEPENDENT_AMBULATORY_CARE_PROVIDER_SITE_OTHER): Payer: MEDICARE | Admitting: Family Medicine

## 2011-05-02 DIAGNOSIS — R350 Frequency of micturition: Secondary | ICD-10-CM

## 2011-05-02 DIAGNOSIS — R32 Unspecified urinary incontinence: Secondary | ICD-10-CM

## 2011-05-02 DIAGNOSIS — R1310 Dysphagia, unspecified: Secondary | ICD-10-CM

## 2011-05-02 DIAGNOSIS — K5900 Constipation, unspecified: Secondary | ICD-10-CM

## 2011-05-02 DIAGNOSIS — N3090 Cystitis, unspecified without hematuria: Secondary | ICD-10-CM | POA: Insufficient documentation

## 2011-05-02 DIAGNOSIS — R413 Other amnesia: Secondary | ICD-10-CM

## 2011-05-02 LAB — POCT URINALYSIS DIPSTICK
Nitrite, UA: NEGATIVE
Protein, UA: NEGATIVE
Urobilinogen, UA: 0.2

## 2011-05-02 MED ORDER — CEPHALEXIN 500 MG PO CAPS: 500.0000 mg | ORAL_CAPSULE | Freq: Four times a day (QID) | ORAL | Status: AC

## 2011-05-02 MED ORDER — SENNOSIDES 8.6 MG PO TABS: 1.0000 | ORAL_TABLET | Freq: Two times a day (BID) | ORAL | Status: DC

## 2011-05-02 NOTE — Progress Notes (Signed)
  Subjective:    Patient ID: Alex Williamson, male    DOB: April 26, 1927, 78 y.o.   MRN: [id number]  HPI Urinary frequency- patient has noted increased urinary frequency of the past 3-4 weeks. He states he often has accidents when he is trying to get to the bathroom during the day but does not have any problems at nighttime. He denies any abdominal pain but states his urine has a smell to it. He denies fever, nausea, vomiting   Constipation- he's had difficulty moving his bowels for many years. He tells me today that he typically uses an index finger to induce rectal stimulation to have a bowel movement. He has been using prune juice but this recently has not helped.  Dysphasia- he has not had his appointment with GI for my last visit in January for his dysphasia. This will need to be rescheduled as he continues to have problems with his pills and some foods. He of course is more concerned with the sputum production he has had for many months.  Review of Systems   GEN- denies fatigue, fever, weight loss,weakness, recent illness CVS- denies chest pain, palpitations RESP- denies SOB, cough, wheeze ABD- denies N/V, change in stools, abd pain MSK- denies joint pain, muscle aches, injury Neuro- denies headache, +mild dizziness , syncope, seizure activity  Psych- no SI, no hallucinations    Objective:   Physical Exam GEN- NAD, alert and oriented x3 HEENT-MMM, oropharynx clear CVS- RRR, no murmur RESP-CTAB Abd-NABS,soft, NT, ND, no suprapubic tenderness, bladder not palpated, no CVA tenderness EXT- No edema         Assessment & Plan:

## 2011-05-02 NOTE — Assessment & Plan Note (Signed)
Will treat UTI initially and then follow-up, this may be an age progression case with his new incontinence though he has no night time symptoms

## 2011-05-02 NOTE — Assessment & Plan Note (Signed)
Will treat for acute cystitis with Keflex and send for urine culture

## 2011-05-02 NOTE — Assessment & Plan Note (Signed)
I will bring up the discussion of medications such as Aricept and Namenda regarding his memory loss, it appears worse to me today as he could not even remember our most recent visits in January and he has no recollection of his medications.

## 2011-05-02 NOTE — Assessment & Plan Note (Signed)
Trial of senna-kot

## 2011-05-02 NOTE — Assessment & Plan Note (Signed)
Refer to GI again. Pt did not hear from them

## 2011-05-02 NOTE — Patient Instructions (Addendum)
I am treating for a urine infection- please give the antibiotic four times a day Get him depends Senna kot- 1 tablet twice a day for his constipation  Please call about his gastroenterology appointment for the dysphagia (difficulty with pills and the phlegm) [name] call me for any questions [phone] Keep his appointment in April

## 2011-05-03 ENCOUNTER — Ambulatory Visit: Payer: MEDICARE | Admitting: Family Medicine

## 2011-05-05 LAB — URINE CULTURE: Colony Count: 50000

## 2011-05-20 ENCOUNTER — Other Ambulatory Visit: Payer: Self-pay | Admitting: Family Medicine

## 2011-05-20 MED ORDER — TRAZODONE HCL 50 MG PO TABS: 50.0000 mg | ORAL_TABLET | Freq: Every day | ORAL | Status: DC

## 2011-05-31 ENCOUNTER — Encounter: Payer: Self-pay | Admitting: Family Medicine

## 2011-05-31 ENCOUNTER — Ambulatory Visit (INDEPENDENT_AMBULATORY_CARE_PROVIDER_SITE_OTHER): Payer: MEDICARE | Admitting: Family Medicine

## 2011-05-31 DIAGNOSIS — R32 Unspecified urinary incontinence: Secondary | ICD-10-CM

## 2011-05-31 DIAGNOSIS — IMO0001 Reserved for inherently not codable concepts without codable children: Secondary | ICD-10-CM

## 2011-05-31 DIAGNOSIS — R350 Frequency of micturition: Secondary | ICD-10-CM

## 2011-05-31 DIAGNOSIS — I2510 Atherosclerotic heart disease of native coronary artery without angina pectoris: Secondary | ICD-10-CM

## 2011-05-31 DIAGNOSIS — E785 Hyperlipidemia, unspecified: Secondary | ICD-10-CM

## 2011-05-31 DIAGNOSIS — K219 Gastro-esophageal reflux disease without esophagitis: Secondary | ICD-10-CM

## 2011-05-31 LAB — POCT URINALYSIS DIPSTICK
Bilirubin, UA: NEGATIVE
Blood, UA: NEGATIVE
Ketones, UA: NEGATIVE
Leukocytes, UA: NEGATIVE
Spec Grav, UA: 1.025
pH, UA: 5.5

## 2011-05-31 MED ORDER — TOLTERODINE TARTRATE 1 MG PO TABS: 2.0000 mg | ORAL_TABLET | Freq: Two times a day (BID) | ORAL | Status: DC | PRN

## 2011-05-31 NOTE — Assessment & Plan Note (Signed)
Continue H2 blocker as his symtoms cause discomfort

## 2011-05-31 NOTE — Patient Instructions (Addendum)
For your medication: Take the trazadone, docusate, Multivitamin, ranitidine, Metprolol and Aspirin every day Take Aleve, benadryl, mucus relief and Tums as needed Stop the laxative, tessalon, nTG (expired), simvastatin Try the detrol for your urine symptoms-if you have dry mouth, worsening constipation stop the medication and call Keep previous F/U appointment

## 2011-05-31 NOTE — Assessment & Plan Note (Signed)
Pt and family want to start meds, will start low dose detrol, discussed symptoms

## 2011-05-31 NOTE — Progress Notes (Signed)
  Subjective:    Patient ID: Alex Williamson, male    DOB: 08-14-1927, 78 y.o.   MRN: [id number]  HPI  Pt here with his daughter, they want to decrease some of his medications. All meds including OTC meds brought to visit.  He is also concerned he ca not hold is urine and at times has an accident, he denies dysuria or abdominal pain but wants medicine to help his urine.    Review of Systems  GEN- denies fatigue, fever, weight loss,weakness, recent illness HEENT- denies eye drainage, change in vision, nasal discharge, CVS- denies chest pain, palpitations RESP- denies SOB, cough, wheeze ABD- denies N/V, change in stools, abd pain GU- denies dysuria, hematuria, dribbling, +incontinence MSK- denies joint pain, muscle aches, injury Neuro- denies headache, dizziness, syncope, seizure activity, falls       Objective:   Physical Exam GEN- NAD, alert and oriented x3 HEENT-MMM, oropharynx clear, poor vision CVS- RRR, no murmur RESP-CTAB Abd-NABS,soft, NT, ND, no suprapubic tenderness, bladder not palpated, no CVA tenderness EXT- No edema   UA-negative       Assessment & Plan:    Some OTC meds were discontinued, he wants to stop some meds Will keep meds that will keep him from having discomforts or pain

## 2011-05-31 NOTE — Assessment & Plan Note (Signed)
He has had a CVA in the past, at this time he wishes to decrease meds, will d/c statin

## 2011-05-31 NOTE — Assessment & Plan Note (Signed)
Continue ASA and beta blocker

## 2011-06-28 ENCOUNTER — Encounter: Payer: Self-pay | Admitting: Family Medicine

## 2011-06-28 ENCOUNTER — Ambulatory Visit (INDEPENDENT_AMBULATORY_CARE_PROVIDER_SITE_OTHER): Payer: MEDICARE | Admitting: Family Medicine

## 2011-06-28 VITALS — BP 134/84 | HR 70 | Resp 16 | Ht 71.0 in | Wt 191.0 lb

## 2011-06-28 DIAGNOSIS — R413 Other amnesia: Secondary | ICD-10-CM

## 2011-06-28 DIAGNOSIS — F3289 Other specified depressive episodes: Secondary | ICD-10-CM

## 2011-06-28 DIAGNOSIS — R32 Unspecified urinary incontinence: Secondary | ICD-10-CM

## 2011-06-28 DIAGNOSIS — F32A Depression, unspecified: Secondary | ICD-10-CM

## 2011-06-28 DIAGNOSIS — F329 Major depressive disorder, single episode, unspecified: Secondary | ICD-10-CM

## 2011-06-28 DIAGNOSIS — I10 Essential (primary) hypertension: Secondary | ICD-10-CM

## 2011-06-28 DIAGNOSIS — R443 Hallucinations, unspecified: Secondary | ICD-10-CM

## 2011-06-28 DIAGNOSIS — L600 Ingrowing nail: Secondary | ICD-10-CM | POA: Insufficient documentation

## 2011-06-28 MED ORDER — OXYBUTYNIN CHLORIDE 5 MG PO TABS: 5.0000 mg | ORAL_TABLET | Freq: Two times a day (BID) | ORAL | Status: DC

## 2011-06-28 MED ORDER — TRAZODONE HCL 50 MG PO TABS: 50.0000 mg | ORAL_TABLET | Freq: Every day | ORAL | Status: DC

## 2011-06-28 NOTE — Progress Notes (Signed)
  Subjective:    Patient ID: Alex Williamson, male    DOB: Sep 15, 1927, 78 y.o.   MRN: [id number]  HPI  Nail problem-he believes he has an ingrown toenail on his left toe. He tried to cut the nail himself. Has a little bit of pain behind the Job. Denies any drainage  Incontinence- they were unable to afford the last pill since then therefore would like to alternative. She did start him on a prostate vitamin which was recommended by the pharmacist.  Hallucinations-over the weekend patient was hallucinating. He noticed Holes in the ground that were not there and he's noted artificial flowers appearing throughout the home. He was not acting out in any way and was not upset or angry. He noted that the flowers or not there and told his daughter. 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 He has been taking his trazodone as prescribed.  Review of Systems - per above   GEN- denies fatigue, fever, weight loss,weakness, recent illness HEENT- denies eye drainage, change in vision, nasal discharge, CVS- denies chest pain, palpitations RESP- denies SOB, cough, wheeze ABD- denies N/V, change in stools, abd pain GU- denies dysuria, hematuria, dribbling,+ incontinence MSK- denies joint pain, muscle aches, injury Neuro- denies headache, dizziness, syncope, seizure activity      Objective:   Physical Exam GEN- NAD, alert and oriented x3 HEENT-MMM, oropharynx clear, poor vision CVS- RRR, no murmur RESP-CTAB Abd-NABS,soft, NT, ND, no suprapubic tenderness,, no CVA tenderness EXT- No edema- Left foot great toe- ingrown nail, mild erythema, no pus, NT, mild swelling along edge of nail , other thickened nails noted Pulse-2+ Neuro- no focal deficits, CNII-XII in tact grossly, Psych - not depressed appearing, not anxious, mentation appropriate, affect normal, no hallucinations, no SI     MMSE 26/ 29     Omitted drawing pt unable to see  Note his sentence was "How can I  survive" Assessment & Plan:

## 2011-06-28 NOTE — Assessment & Plan Note (Signed)
Warm epsom salt soaks, referral to podiatry for nail clipping

## 2011-06-28 NOTE — Assessment & Plan Note (Signed)
Other he describes these hallucinations it is very odd that during the entire time he knew that they were abnormal fissions. He does not seem very depressed on today's exam. I have sent for records for his psychiatrist again. He signed a release to my office today. At this time since he is not acting out in any way I would not start any psychotropic medications such as Haldol

## 2011-06-28 NOTE — Assessment & Plan Note (Signed)
MMSE today looks fairly well, with his hallucinations and depression, will obtain MRI, daughter would like for this to be evaluated. I am also obtaining psychiatric records

## 2011-06-28 NOTE — Assessment & Plan Note (Signed)
Trial of ditropan instead

## 2011-06-28 NOTE — Patient Instructions (Signed)
I will set you up for an MRI I will try a new bladder pill Continue all other medications I will call when I get records from the psychiatrist We will send the social worker back to the home for the Living will F/U 3 months

## 2011-06-28 NOTE — Assessment & Plan Note (Signed)
No evidence of infection, only new med is vitamin OTC, at baseline today

## 2011-06-28 NOTE — Assessment & Plan Note (Signed)
Well controlled 

## 2011-06-29 ENCOUNTER — Telehealth: Payer: Self-pay | Admitting: Family Medicine

## 2011-06-29 NOTE — Telephone Encounter (Signed)
Daughter is aware 

## 2011-07-05 ENCOUNTER — Ambulatory Visit (HOSPITAL_COMMUNITY)
Admission: RE | Admit: 2011-07-05 | Discharge: 2011-07-05 | Disposition: A | Discharge status: Home / Self Care | Payer: MEDICARE | Source: Ambulatory Visit | Attending: Family Medicine | Admitting: Family Medicine

## 2011-07-05 DIAGNOSIS — R413 Other amnesia: Secondary | ICD-10-CM | POA: Insufficient documentation

## 2011-07-05 DIAGNOSIS — R443 Hallucinations, unspecified: Secondary | ICD-10-CM | POA: Insufficient documentation

## 2011-07-05 DIAGNOSIS — G319 Degenerative disease of nervous system, unspecified: Secondary | ICD-10-CM | POA: Insufficient documentation

## 2011-07-06 ENCOUNTER — Telehealth: Payer: Self-pay | Admitting: Family Medicine

## 2011-07-07 ENCOUNTER — Encounter (HOSPITAL_COMMUNITY): Payer: Self-pay | Admitting: Psychology

## 2011-07-07 NOTE — Progress Notes (Signed)
Patient:   Alex Williamson   DOB:   1927/03/31  MR Number:  [id number]  Location:  [facility] [address] [place] [address] Dept: [phone]           Date of Service:   02/24/2011  Start Time:   3 PM End Time:   4 PM  Provider/Observer:  Hershal Coria PSYD       Billing Code/Service: [phone]  Chief Complaint:     Chief Complaint  Patient presents with  . Hallucinations  . Memory Loss    Reason for Service:  The patient was referred for a neuropsychological evaluation to 2 the development of hallucinations and delusions and significant changes in his cognitive functioning. I saw the patient back in 2010 and a neuropsychological evaluation was conducted at that time. He had neurological/neuropsychological symptoms dating to 2005 when the possibility of 2 previous strokes existed. After the strokes he had been arrested and charged with child molestation. He had significant memory deficits that he reported with significant lapses of recall for large stretches of his life. He had developed seizures during this time and there was difficulty finding particular effective antiseizure medications but they were able to diminish and control his seizures. At that time, the patient produced a full-scale IQ score in the high average range of intellectual abilities. Consistent relative weaknesses were found at that time related to working memory but all other cognitive areas were in the average to high average range of functioning. 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 There were significant differences from free recall of auditory information which was in the average range but he was not able to retain auditory information after a period of delay to any greater degree even with cuing. This suggests that they're likely been a significant  focal cerebral stroke and he displayed significant memory deficits for learning new information and lack help from cuing. I will present the neuropsychological measures conducted in 2010 and comparisons with the ones that we will drive now.  Current Status:  Currently, the patient has a number of medical issues which are noted in his chart. Of significance is that he has macular degeneration in his right eye started in 2007 and he developed these symptoms in his left eye this past June. He reports that his visual acuity has been diminishing slowly over time. The patient also reports that he is having significant word finding problems and further deterioration in his memory abilities including significant changes in his short-term memory. The patient denied any significant geographic disorientation. He does acknowledge the development of hallucinations which he describes is primarily visual in nature. He reports seeing an image which almost appears like it is on a TV screen or projector screening of 8 "tribe of people" but that he cannot really understand what they are saying and niece does feel like dreams of this tribe of a family or some type of people performing some type of right serrate joules. His daughter describes the patient mumbling and having these hallucinations. They report that these difficulties or symptoms can last for an hour or more as if he is watching a movie at his doorway. There is significant disorientation reported during this time. There is significant concern about the symptoms coming from both of his daughters. His daughters have been available to him.  Reliability of Information: This information was provided  by both the patient as well as his family.  Behavioral Observation: Alex Williamson  presents as a 78 y.o.-year-old Right Caucasian Male who appeared his stated age. his dress was Appropriate and he was Fairly Groomed and his manners were Appropriate to the situation.  There  were any physical disabilities noted and in particular some limited visual abilities.  he displayed an appropriate level of cooperation and motivation.    Interactions:    Active   Attention:   abnormal  Memory:   abnormal  Visuo-spatial:   abnormal  Speech (Volume):  low  Speech:   normal pitch  Thought Process:  Tangential  Though Content:  Rumination  Orientation:   person, place and situation  Judgment:   Poor  Planning:   Poor  Affect:    Anxious  Mood:    Anxious  Insight:   Lacking  Intelligence:   normal  Marital Status/Living:  the patient currently lives with his daughter and son-in-law. He is widowed but had been married 5 times in the past. He was born and raised in [place] with his mother and stepfather. He has one brother. The patient has 8 children 3 of whom are boys and 5 girls. He spends his leisure time gardening. He does have some ongoing conflicts with her daughter in [place].  Current Employment: The patient is not working.  Past Employment:  The patient is retired  Substance Use:  No concerns of substance abuse are reported.  the patient denies any current or significant past difficulties with substance abuse.  Education:   HS Graduate  Medical History:   Past Medical History  Diagnosis Date  . Hypertension   . Hyperlipidemia   . GERD (gastroesophageal reflux disease)   . Cerebrovascular disease 2005    left parietal CVA  . Macular degeneration     + cataract; legally blind  . Arteriosclerotic cardiovascular disease (ASCVD) 1982    Acute MI in 1982; returned with non-ST segment elevation MI in 07/2010; emergency CABG-Dr. Laneta Simmers  . Chronic kidney disease, stage 2, mildly decreased GFR         Outpatient Encounter Prescriptions as of 02/24/2011  Medication Sig Dispense Refill  . aspirin 81 MG tablet Take 81 mg by mouth daily.        . calcium carbonate (TUMS - DOSED IN MG ELEMENTAL CALCIUM) 500 MG chewable tablet Chew 1 tablet by  mouth at bedtime as needed.        . diphenhydrAMINE (BENADRYL) 25 mg capsule Take 25 mg by mouth every 6 (six) hours as needed.        . fluticasone (FLONASE) 50 MCG/ACT nasal spray Place 2 sprays into the nose daily.  16 g  2  . mirtazapine (REMERON) 15 MG tablet Take 1 tablet (15 mg total) by mouth at bedtime.  30 tablet  2  . Multiple Vitamin (MULTIVITAMIN) tablet Take 1 tablet by mouth daily.        . naproxen sodium (ANAPROX) 220 MG tablet Take 220 mg by mouth 2 (two) times daily with a meal.               Sexual History:   History  Sexual Activity  . Sexually Active: Not on file    Abuse/Trauma History: There are no noted significant history of abuse or trauma.  Psychiatric History:  The patient was seen by myself back in 2010 after being asked to have a neuropsychological evaluation and ongoing counseling to 2  pending or past legal charges. The patient was discharged in convicted of child endangerment child molestation in [place] and when he moved up here their requirements by the court that he be seen by someone up here. The patient had prior neuropsychological testing that suggested the presence of previous history of stroke.  Family Med/Psych History: No family history on file.  Risk of Suicide/Violence: low the patient denies any indications or impulses for of suicide or homicidality.  Impression/DX:  At this point, the patient does appear to be having some significant ongoing deterioration in his cognitive functioning. In 2010 the indications were more of a previous stroke that occurred back in 2005. He had focal memory deficits but overall his cognitive performance was fairly good and in the high average range. At this time, there is indications of significant deterioration and the possibility of either continued cerebrovascular events that are producing multi-infarct dementia versus other dementing processes.  Disposition/Plan:  We will set up for formal neuropsychological  testing even though there will be some limits do to his visual difficulties.  Diagnosis:    Axis I:   1. Cognitive disorder   2. Psychotic disorder         Axis II: Deferred       Axis III:  See body of medical reports history of medical issues.      Axis IV:  housing problems and other psychosocial or environmental problems          Axis V:  51-60 moderate symptoms

## 2011-07-07 NOTE — Progress Notes (Signed)
GENERAL INTELLECTUAL FUNCTIONING:  The patient was initially administered the parts of the CERAD Neuropsychological Battery.  In addition he was administered parts of the WAIS-III and WMS-III.  There were limitations in full administration of each of these batteries do to macular degeneration and significant changes in his visual acuity and visual abilities. There for assessment of highly visual tasks in nature were likely to be invalid and thus not administered. The patient also been given a neuropsychological evaluation to a half years ago and were scores from the previous testing are also reproduced in the current assessment that'll be direct comparisons made and references to these previous assessments.  The patient was assessed a number of sensitive measures from a dementia neuropsychological evaluation with normative data available for a normal functioning age match population as well as a mild and moderate Alzheimer's population. The lower the results for the measures that were obtained.      VF Name MMSE   Patient:  12 13 24    Normal Age Matched 57 14.6 28.9   Mild SDAT:  8.8 11.8 20   Moderate SDAT 6.8 10.5 16.1    On the CERAD battery, which is a battery of measures shown to be sensitive to various forms of dementia, the patient's performance was typically equal to or below and age matched normal population but significantly above the mild senile dementia of the Alzheimer's type population. For example, the patient was given a verbal fluency task in which he was asked to name as many animals as he could over a period of 60 seconds. The patient was able to name 12 various animals. A normative population performance was 18 but it even a mild dementia a population do to Alzheimer's produced an average verbal fluency of 8.8 and a moderate of 6.6 animals reported over 60 seconds. While this performance does suggest some mild verbal fluency and naming abilities difficulties it is not severely  impaired relative to a normative population.  The patient was then administered the [employer] and comparisons were done to normal age matched populations in those of mild and moderate senile dementia of the Alzheimer's type comparisons. In the naming performance the patient produced a score of 13 which is slightly below and age matched normative population but above it even a mild Alzheimer's population.  The patient was then given and standardized Mini-Mental status exam and he produced a standard score of 24. This compares to an average score for normative population of 28.9 but even a mild population of Alzheimer's group of 20. The patient correctly identified the year, season, date, day of the week, and month. He was well oriented to where he was as well as the situation that he was in. He was able to immediately name 3 objects when presented to him. He had some very mild difficulty spelling world backwards but was able to achieve 4 of 5 letters correctly. He was able to correct himself on spelling world backwards at a later time in the testing. However, the patient was unable to remember the 3 objects learn previously after this brief period of time with interference. There were no indications of expressive or receptive language deficits. He was able to follow simple instructions and produce a complete sentence and accurate visual spatial abilities on the simple drawing task. Overall, his objective Mini-Mental status exam showed the only deficits having to do with recall of newly learned information and no orientation difficulties or deficits at all.   The patient's performance  on the Weschler Adult Intellectual Scale-III, classifies his current verbal IQ score to be a 99. However, because of significant visual limitations there were several of the subtest that were not able to be administered and we were not able to get a full scale IQ score but previous full-scale IQ scores found his baseline  IQ measured to be in the high average range. Index scores it were available show that his verbal comprehension index score was 112 and his working memory index score was 84. Making direct comparisons to previous assessments showed that his verbal IQ score was 110 in 2010 and his verbal comprehension index score was 112 which is identical to the current assessment his working memory at that time was 97 and now is 84. This pattern suggests that he has shown some deterioration in his working memory and encoding abilities but his verbal comprehension abilities remain intact.   Subtest Scores:   07/09/2008 03/29/2011  Vocabulary:    13  11  Similarities:    13  13 Arithmetic:    13  5 Digit Span:    9  8 Information:    11  13 Comprehension:   11  10 Letter Number Sequencing:  7  9   WAIS-III analysis reveals a VIQ score currently of 99 which placed him at the 47th percentile. The assessment conducted in 2010 was a 110 and suggestive 11 point drop. Most of this change in his verbal IQ functioning was accounted for by significant changes in his scores on arithmetic. This is a complex  attentional abilities test rather than truly fully assessing his mathematical abilities. The other scores generally stayed the same. Index score showed a stability in his verbal comprehension from 3 years ago with in fact the exact same score today. Her precipitous and significant drop in his working memory was identified where he produced a working memory index score of 97 and 2010 and a working memory index score of 84 currently.  Currently, the patient's vocabulary knowledge is in the average range, his verbal reasoning abilities are at the upper end of the average range, significant deficits with regard to multiprocessing another attentional measures beyond simple encoding, mild deficits with regard to auditory encoding, high average performance with regard to his general fund of information and average performance on his  social judgment and comprehension as well as his more simple auditory encoding abilities.  The patient was also ministered portions of the Weschler memory scales-III and comparisons will be made to his performance back in 2010.  The patient's performance when compared to previous assessments show some areas of stable performance. With the guard to his immediate learning of auditory information he continues to perform in the average range with regard to remembering stories information. He produced and improve learning slope this time versus last time suggesting that he is actually learning little bit better over time with regard to story recall. His auditory learning and memory appears to be intact for new information. However, a profound change is identified when the patient is asked recall this initially learned information after a period of delay. In 2010 he produced a standard score of 10 which is in the average range with regard to retention of information after it a period of delay. However, his current performance produced a standard score of 5 which is significantly impaired and significantly below what it was just 3 years ago. For example in his auditory learning after a single trial learning he produced a percentile  rank of 33 which is in the average range. His ability to learn after repeated presentations produced a percentile rank of 81 which is in fact better than previous testing. However, his retention of this information in 2010 produced is a percentile rank of 35 which is also in the average range. His current percentile writing for retention of auditory information is an 8 which is significantly impaired and well below predicted levels based on his performance back in 2010.  CONCLUSIONS:    The overall results of these formal and objective measures of cognitive performance suggest direct comparisons to a neuropsychological evaluation is conducted in 2010 suggest that for the most part his  current intellectual and cognitive abilities remain intact with regard to verbal comprehension and verbal abilities. Some measures were unable to be repeated due to significant visual deficits due to macular degeneration. However, the patient showed little or no change in areas besides more complex multiprocessing and auditory encoding abilities as well as profound changes in his ability to retain information after a brief period of delay. The patient's initial learning remains quite strong but he is unable to retain that information later but is able to benefit from cueing of that potentially learned information and his retrieval is okay with cueing but significantly impaired when free recall is requested. Overall, the pattern of neuropsychological strengths and weaknesses and neck direct comparisons to previous testing are not consistent with condition such as Alzheimer's as the etiological factor. I do think that further cerebrovascular issues are happening and the progressive loss of sight due to macular degeneration and his brains attempts to try to deal with this and comprehend this her producing some of the visual hallucinations. Therefore, think we have a factor of neurological adjustments to progressive vision loss and the results of focal vascular legions from previous strokes and ongoing cerebrovascular events of a multi-infarct dementia type.

## 2011-07-08 ENCOUNTER — Telehealth: Payer: Self-pay | Admitting: Family Medicine

## 2011-07-08 MED ORDER — DONEPEZIL HCL 5 MG PO TABS: 5.0000 mg | ORAL_TABLET | Freq: Every day | ORAL | Status: DC

## 2011-07-08 NOTE — Telephone Encounter (Signed)
I sent in meds to [facility], Left message on answering machine about psychiatry testing.

## 2011-07-08 NOTE — Telephone Encounter (Signed)
Dr Jeanice Lim already sent in

## 2011-07-08 NOTE — Telephone Encounter (Signed)
Alex Williamson states he DOES now want you to call in the new dementia med for him to his pharmacy.

## 2011-08-11 ENCOUNTER — Encounter: Payer: Self-pay | Admitting: Gastroenterology

## 2011-08-17 ENCOUNTER — Encounter: Payer: Self-pay | Admitting: Family Medicine

## 2011-08-17 ENCOUNTER — Ambulatory Visit (INDEPENDENT_AMBULATORY_CARE_PROVIDER_SITE_OTHER): Payer: MEDICARE | Admitting: Family Medicine

## 2011-08-17 VITALS — BP 118/80 | HR 64 | Temp 98.5°F | Resp 16 | Ht 71.0 in | Wt 185.0 lb

## 2011-08-17 DIAGNOSIS — R1310 Dysphagia, unspecified: Secondary | ICD-10-CM

## 2011-08-17 DIAGNOSIS — N182 Chronic kidney disease, stage 2 (mild): Secondary | ICD-10-CM

## 2011-08-17 DIAGNOSIS — L600 Ingrowing nail: Secondary | ICD-10-CM

## 2011-08-17 DIAGNOSIS — R093 Abnormal sputum: Secondary | ICD-10-CM

## 2011-08-17 DIAGNOSIS — I10 Essential (primary) hypertension: Secondary | ICD-10-CM

## 2011-08-17 DIAGNOSIS — R413 Other amnesia: Secondary | ICD-10-CM

## 2011-08-17 LAB — BASIC METABOLIC PANEL
BUN: 22 mg/dL (ref 6–23)
CO2: 25 mEq/L (ref 19–32)
Calcium: 10 mg/dL (ref 8.4–10.5)
Chloride: 107 mEq/L (ref 96–112)
Creat: 1.51 mg/dL — ABNORMAL HIGH (ref 0.50–1.35)

## 2011-08-17 NOTE — Patient Instructions (Signed)
Call gastroneterolgy back for your appt I will refer you to the foot doctor Soak toe in epsom salt and clean until then for the next 2 days

## 2011-08-17 NOTE — Progress Notes (Signed)
  Subjective:    Patient ID: Alex Williamson, male    DOB: 1927-03-30, 78 y.o.   MRN: [id number]  HPI  Pt presents for increased sputum production, denies sore throat, cough, He has been seen for this multiple times, but he does not recall. His daughter is here today and states she did not know as well. At our last visit he was referred to GI for dysphagia and podiatry for ingrown toenail but they did not get to either appt. Today, he has removed part of the left toenail because it was bothering him, he still has ingrown section to it which is tender, he also has tender spot on ball of foot.  Review of Systems   GEN- denies fatigue, fever, weight loss,weakness, recent illness HEENT- denies eye drainage, change in vision, nasal discharge, CVS- denies chest pain, palpitations RESP- denies SOB, cough, wheeze ABD- denies N/V, change in stools, abd pain GU- denies dysuria, hematuria, dribbling,+ incontinence MSK- denies joint pain, muscle aches, injury Neuro- denies headache, dizziness, syncope, seizure activity      Objective:   Physical Exam GEN- NAD, alert and oriented x 3 HEENT-MMM, oropharynx clear, poor vision CVS- RRR, no murmur RESP-CTAB EXT- No edema- Left foot great toe- ingrown nail on medial aspect, 1/2 of nail missing- no erythema, no pus, NT, mild swelling along edge of nail , other thickened nails noted,  Left foot 2cm below great toe pre-ucler noted Pulse-2+       Assessment & Plan:

## 2011-08-18 LAB — CBC

## 2011-08-18 NOTE — Assessment & Plan Note (Signed)
At goal no change to meds 

## 2011-08-18 NOTE — Assessment & Plan Note (Signed)
Chart notes where they were contacted multiple times for appt with GI, I have given daughter number she can reschedule

## 2011-08-18 NOTE — Assessment & Plan Note (Signed)
Pt has same problem except he decided to try and remove nail and did not succeed , will refer to podiatry again Epsom salt, keep area clean,no sign of infection

## 2011-08-18 NOTE — Assessment & Plan Note (Addendum)
Negative work-up with x-ray, sputum culture, treatment, ENT referral, ongoing complaint

## 2011-08-18 NOTE — Assessment & Plan Note (Signed)
Check labs 

## 2011-08-18 NOTE — Assessment & Plan Note (Signed)
Continue aricept 

## 2011-09-07 ENCOUNTER — Other Ambulatory Visit: Payer: Self-pay | Admitting: Internal Medicine

## 2011-09-07 ENCOUNTER — Ambulatory Visit (INDEPENDENT_AMBULATORY_CARE_PROVIDER_SITE_OTHER): Payer: MEDICARE | Admitting: Urgent Care

## 2011-09-07 ENCOUNTER — Encounter: Payer: Self-pay | Admitting: Urgent Care

## 2011-09-07 VITALS — BP 142/81 | HR 56 | Temp 98.4°F | Ht 69.0 in | Wt 183.8 lb

## 2011-09-07 DIAGNOSIS — K219 Gastro-esophageal reflux disease without esophagitis: Secondary | ICD-10-CM

## 2011-09-07 DIAGNOSIS — R1310 Dysphagia, unspecified: Secondary | ICD-10-CM

## 2011-09-07 DIAGNOSIS — Z1211 Encounter for screening for malignant neoplasm of colon: Secondary | ICD-10-CM

## 2011-09-07 MED ORDER — OMEPRAZOLE 20 MG PO CPDR: 20.0000 mg | DELAYED_RELEASE_CAPSULE | Freq: Every day | ORAL | Status: DC

## 2011-09-07 NOTE — Assessment & Plan Note (Signed)
Alex Williamson is a pleasant 78 y.o. male with chronic dysphagia mostly with solid foods.  Will need EGD with possible esophageal dilation with Dr. Jena Gauss to r/o esophageal web, ring or stricture.  I have discussed risks & benefits which include, but are not limited to, bleeding, infection, perforation & drug reaction.  The patient agrees with this plan & written consent will be obtained.

## 2011-09-07 NOTE — Assessment & Plan Note (Signed)
Poorly controlled.  Stop Zantac.  Start omeprazole 20mg  daily.

## 2011-09-07 NOTE — Progress Notes (Signed)
Referring Provider: Salley Scarlet, MD Primary Care Physician:  Milinda Antis, MD Primary Gastroenterologist:  Dr. Jena Gauss  Chief Complaint  Patient presents with  . Dysphagia    HPI:  Alex Williamson is a 78 y.o. male here as a referral from Dr. Jeanice Lim for dysphagia.  He tells me 20 years ago he "messed up" his esophagus by drinking a ton of orange juice & had severe acid reflux.  More recently he has c/o phlegm in his throat & frequent throat clearing.  He c/o nocturnal acid & water brash.  He feels like solids are getting stuck in upper esophagus.  Denies any problems w/ liquids.  CVA 2005 & no dysphagia at that time so far as he can remember.  He tells me he cuts/chews his food into very small pieces.  He is on zantac & still takes TUMS every night for heartburn.  He has not tried PPI.   Denies nausea, vomiting, odynophagia or anorexia.  Wt loss 12# in the past 7-8 months by being careful about portion size & does exercises in the morning.  Denies diarrhea, rectal bleeding, melena or weight loss.  Occasionally has constipation but this responds to prune juice.    Past Medical History  Diagnosis Date  . Hypertension   . Hyperlipidemia   . GERD (gastroesophageal reflux disease)   . Cerebrovascular disease 2005    left parietal CVA  . Macular degeneration     + cataract; legally blind  . Arteriosclerotic cardiovascular disease (ASCVD) 1982    Acute MI in 1982; returned with non-ST segment elevation MI in 07/2010; emergency CABG-Dr. Laneta Simmers  . Chronic kidney disease, stage 2, mildly decreased GFR     Past Surgical History  Procedure Date  . Heart bypass 08/2010    [facility]  . Tonsillectomy   . Hernia repair   . Coronary artery bypass graft     Current Outpatient Prescriptions  Medication Sig Dispense Refill  . aspirin 81 MG tablet Take 81 mg by mouth daily.        . calcium carbonate (TUMS - DOSED IN MG ELEMENTAL CALCIUM) 500 MG chewable tablet Chew 1 tablet by mouth at  bedtime as needed.        . docusate sodium (COLACE) 100 MG capsule Take 100 mg by mouth 2 (two) times daily.      Marland Kitchen donepezil (ARICEPT) 5 MG tablet Take 1 tablet (5 mg total) by mouth at bedtime.  30 tablet  2  . fluticasone (FLONASE) 50 MCG/ACT nasal spray Place 2 sprays into the nose daily.  16 g  2  . metoprolol tartrate (LOPRESSOR) 25 MG tablet Take 1/2 tablet daily for heart  30 tablet  3  . Multiple Vitamin (MULTIVITAMIN) tablet Take 1 tablet by mouth daily.        Marland Kitchen oxybutynin (DITROPAN) 5 MG tablet Take 1 tablet (5 mg total) by mouth 2 (two) times daily.  60 tablet  1  . ranitidine (ZANTAC) 150 MG capsule Take 1 capsule (150 mg total) by mouth 2 (two) times daily. Dose decrease  60 capsule  6  . simvastatin (ZOCOR) 40 MG tablet Take 40 mg by mouth every evening.      . traZODone (DESYREL) 50 MG tablet Take 50 mg by mouth at bedtime.      Marland Kitchen DISCONTD: traZODone (DESYREL) 50 MG tablet Take 1 tablet (50 mg total) by mouth at bedtime.  30 tablet  3  . omeprazole (PRILOSEC) 20 MG capsule  Take 1 capsule (20 mg total) by mouth daily.  30 capsule  2  . traZODone (DESYREL) 50 MG tablet Take 1 tablet (50 mg total) by mouth at bedtime.  30 tablet  1  . DISCONTD: loratadine (CLARITIN) 10 MG tablet Take 1 tablet (10 mg total) by mouth daily.  30 tablet  3  . DISCONTD: omeprazole (PRILOSEC) 20 MG capsule Take 1 capsule (20 mg total) by mouth daily.  30 capsule  2    Allergies as of 09/07/2011 - Review Complete 09/07/2011  Allergen Reaction Noted  . Remeron (mirtazapine)  03/15/2011    No family history on file.There is no known family history of colorectal carcinoma , liver disease, or inflammatory bowel disease.   History   Social History  . Marital Status: Widowed    Spouse Name: N/A    Number of Children: 8  . Years of Education: N/A   Occupational History  . retired    Social History Main Topics  . Smoking status: Never Smoker   . Smokeless tobacco: Not on file  . Alcohol Use: No   . Drug Use: No  . Sexually Active: Not on file   Other Topics Concern  . Not on file   Social History Narrative   Lives w/ daughter.  Moved from [place] 2009.4 biological children4 adopted children    Review of Systems: Gen: Denies any fever, chills, sweats, anorexia, fatigue, weakness, malaise, weight loss, and sleep disorder CV: Denies chest pain, angina, palpitations, syncope, orthopnea, PND, peripheral edema, and claudication. Resp: Denies dyspnea at rest, dyspnea with exercise, cough, sputum, wheezing, coughing up blood, and pleurisy. GI: Denies vomiting blood, jaundice, and fecal incontinence.    GU : Denies urinary burning, blood in urine, urinary frequency, urinary hesitancy, nocturnal urination, and urinary incontinence. MS: Denies joint pain, limitation of movement, and swelling, stiffness, low back pain, extremity pain. Denies muscle weakness, cramps, atrophy.  Derm: Denies rash, itching, dry skin, hives, moles, warts, or unhealing ulcers.  Psych: Denies depression, anxiety, memory loss, suicidal ideation, hallucinations, paranoia, and confusion. Heme: Denies bruising, bleeding, and enlarged lymph nodes. Neuro:  Denies any headaches, dizziness, paresthesias. Endo:  Denies any problems with DM, thyroid, adrenal function.  Physical Exam: BP 142/81  Pulse 56  Temp 98.4 F (36.9 C) (Temporal)  Ht 5\' 9"  (1.753 m)  Wt 183 lb 12.8 oz (83.371 kg)  BMI 27.14 kg/m2 General:   Alert,  Well-developed, well-nourished, pleasant and cooperative in NAD Head:  Normocephalic and atraumatic. Eyes:  Sclera clear, no icterus.   Conjunctiva pink. Ears:  Normal auditory acuity. Nose:  No deformity, discharge, or lesions. Mouth:  No deformity or lesions,oropharynx pink & moist. Neck:  Supple; no masses or thyromegaly. Lungs:  Clear throughout to auscultation.   No wheezes, crackles, or rhonchi. No acute distress. Heart:  Regular rate and rhythm; no murmurs, clicks, rubs,  or  gallops. Abdomen:  Normal bowel sounds.  No bruits.  Soft, non-tender and non-distended without masses, hepatosplenomegaly or hernias noted.  No guarding or rebound tenderness.   Rectal:  Deferred. Msk:  Symmetrical without gross deformities. Normal posture. Pulses:  Normal pulses noted. Extremities:  No clubbing or edema. Neurologic:  Alert and oriented x4;  grossly normal neurologically. Skin:  Intact without significant lesions or rashes. Lymph Nodes:  No significant cervical adenopathy. Psych:  Alert and cooperative. Normal mood and affect. '

## 2011-09-07 NOTE — Progress Notes (Signed)
Faxed to PCP

## 2011-09-07 NOTE — Patient Instructions (Addendum)
Stop Zantac Begin omeprazole 20 mg 30 minutes before breakfast You need an EGD with Dr. Jena Gauss and he may stretch her esophagus.

## 2011-09-09 ENCOUNTER — Encounter (HOSPITAL_COMMUNITY): Payer: Self-pay | Admitting: Pharmacy Technician

## 2011-09-19 ENCOUNTER — Other Ambulatory Visit: Payer: Self-pay

## 2011-09-19 ENCOUNTER — Telehealth: Payer: Self-pay | Admitting: Family Medicine

## 2011-09-19 MED ORDER — OXYBUTYNIN CHLORIDE 5 MG PO TABS: 5.0000 mg | ORAL_TABLET | Freq: Two times a day (BID) | ORAL | Status: DC

## 2011-09-19 MED ORDER — RANITIDINE HCL 150 MG PO CAPS: 150.0000 mg | ORAL_CAPSULE | Freq: Two times a day (BID) | ORAL | Status: DC

## 2011-09-19 MED ORDER — SODIUM CHLORIDE 0.45 % IV SOLN
Freq: Once | INTRAVENOUS | Status: AC
  Administered 2011-09-20: 1000 mL via INTRAVENOUS

## 2011-09-19 MED ORDER — TRAZODONE HCL 50 MG PO TABS: 50.0000 mg | ORAL_TABLET | Freq: Every day | ORAL | Status: DC

## 2011-09-19 NOTE — Telephone Encounter (Signed)
Med refilled.

## 2011-09-20 ENCOUNTER — Encounter (HOSPITAL_COMMUNITY)
Admission: RE | Disposition: A | Discharge status: Home / Self Care | Payer: Self-pay | Source: Ambulatory Visit | Attending: Internal Medicine

## 2011-09-20 ENCOUNTER — Encounter (HOSPITAL_COMMUNITY): Payer: Self-pay | Admitting: *Deleted

## 2011-09-20 ENCOUNTER — Ambulatory Visit (HOSPITAL_COMMUNITY)
Admission: RE | Admit: 2011-09-20 | Discharge: 2011-09-20 | Disposition: A | Discharge status: Home / Self Care | Payer: MEDICARE | Source: Ambulatory Visit | Attending: Internal Medicine | Admitting: Internal Medicine

## 2011-09-20 DIAGNOSIS — R1310 Dysphagia, unspecified: Secondary | ICD-10-CM

## 2011-09-20 DIAGNOSIS — K222 Esophageal obstruction: Secondary | ICD-10-CM | POA: Insufficient documentation

## 2011-09-20 DIAGNOSIS — K2940 Chronic atrophic gastritis without bleeding: Secondary | ICD-10-CM | POA: Insufficient documentation

## 2011-09-20 DIAGNOSIS — K219 Gastro-esophageal reflux disease without esophagitis: Secondary | ICD-10-CM

## 2011-09-20 DIAGNOSIS — Z79899 Other long term (current) drug therapy: Secondary | ICD-10-CM | POA: Insufficient documentation

## 2011-09-20 DIAGNOSIS — N182 Chronic kidney disease, stage 2 (mild): Secondary | ICD-10-CM | POA: Insufficient documentation

## 2011-09-20 DIAGNOSIS — Z7982 Long term (current) use of aspirin: Secondary | ICD-10-CM | POA: Insufficient documentation

## 2011-09-20 DIAGNOSIS — Z951 Presence of aortocoronary bypass graft: Secondary | ICD-10-CM | POA: Insufficient documentation

## 2011-09-20 DIAGNOSIS — K2960 Other gastritis without bleeding: Secondary | ICD-10-CM

## 2011-09-20 DIAGNOSIS — Z8673 Personal history of transient ischemic attack (TIA), and cerebral infarction without residual deficits: Secondary | ICD-10-CM | POA: Insufficient documentation

## 2011-09-20 DIAGNOSIS — I129 Hypertensive chronic kidney disease with stage 1 through stage 4 chronic kidney disease, or unspecified chronic kidney disease: Secondary | ICD-10-CM | POA: Insufficient documentation

## 2011-09-20 DIAGNOSIS — K449 Diaphragmatic hernia without obstruction or gangrene: Secondary | ICD-10-CM

## 2011-09-20 DIAGNOSIS — E785 Hyperlipidemia, unspecified: Secondary | ICD-10-CM | POA: Insufficient documentation

## 2011-09-20 HISTORY — PX: ESOPHAGOGASTRODUODENOSCOPY: SHX1529

## 2011-09-20 HISTORY — PX: SAVORY DILATION: SHX5439

## 2011-09-20 HISTORY — PX: MALONEY DILATION: SHX5535

## 2011-09-20 SURGERY — ESOPHAGOGASTRODUODENOSCOPY (EGD) WITH ESOPHAGEAL DILATION
Anesthesia: Moderate Sedation

## 2011-09-20 MED ORDER — STERILE WATER FOR IRRIGATION IR SOLN
Status: DC | PRN
  Administered 2011-09-20: 15:00:00

## 2011-09-20 MED ORDER — BUTAMBEN-TETRACAINE-BENZOCAINE 2-2-14 % EX AERO
INHALATION_SPRAY | CUTANEOUS | Status: DC | PRN
  Administered 2011-09-20: 2 via TOPICAL

## 2011-09-20 MED ORDER — MIDAZOLAM HCL 5 MG/5ML IJ SOLN
INTRAMUSCULAR | Status: AC
  Filled 2011-09-20: qty 10

## 2011-09-20 MED ORDER — MIDAZOLAM HCL 5 MG/5ML IJ SOLN
INTRAMUSCULAR | Status: DC | PRN
  Administered 2011-09-20: 1 mg via INTRAVENOUS
  Administered 2011-09-20: 2 mg via INTRAVENOUS

## 2011-09-20 MED ORDER — MEPERIDINE HCL 100 MG/ML IJ SOLN
INTRAMUSCULAR | Status: AC
  Filled 2011-09-20: qty 1

## 2011-09-20 MED ORDER — MEPERIDINE HCL 100 MG/ML IJ SOLN
INTRAMUSCULAR | Status: DC | PRN
  Administered 2011-09-20 (×2): 25 mg via INTRAVENOUS

## 2011-09-20 NOTE — H&P (View-Only) (Signed)
Referring Provider: [place], Kawanta F, MD Primary Care Physician:  Hazen, KAWANTA, MD Primary Gastroenterologist:  Dr. Rourk  Chief Complaint  Patient presents with  . Dysphagia    HPI:  Alex Williamson is a 78 y.o. male here as a referral from Dr.  for dysphagia.  He tells me 20 years ago he "messed up" his esophagus by drinking a ton of orange juice & had severe acid reflux.  More recently he has c/o phlegm in his throat & frequent throat clearing.  He c/o nocturnal acid & water brash.  He feels like solids are getting stuck in upper esophagus.  Denies any problems w/ liquids.  CVA 2005 & no dysphagia at that time so far as he can remember.  He tells me he cuts/chews his food into very small pieces.  He is on zantac & still takes TUMS every night for heartburn.  He has not tried PPI.   Denies nausea, vomiting, odynophagia or anorexia.  Wt loss 12# in the past 7-8 months by being careful about portion size & does exercises in the morning.  Denies diarrhea, rectal bleeding, melena or weight loss.  Occasionally has constipation but this responds to prune juice.    Past Medical History  Diagnosis Date  . Hypertension   . Hyperlipidemia   . GERD (gastroesophageal reflux disease)   . Cerebrovascular disease 2005    left parietal CVA  . Macular degeneration     + cataract; legally blind  . Arteriosclerotic cardiovascular disease (ASCVD) 1982    Acute MI in 1982; returned with non-ST segment elevation MI in 07/2010; emergency CABG-Dr. Bartle  . Chronic kidney disease, stage 2, mildly decreased GFR     Past Surgical History  Procedure Date  . Heart bypass 08/2010    [facility]  . Tonsillectomy   . Hernia repair   . Coronary artery bypass graft     Current Outpatient Prescriptions  Medication Sig Dispense Refill  . aspirin 81 MG tablet Take 81 mg by mouth daily.        . calcium carbonate (TUMS - DOSED IN MG ELEMENTAL CALCIUM) 500 MG chewable tablet Chew 1 tablet by mouth at  bedtime as needed.        . docusate sodium (COLACE) 100 MG capsule Take 100 mg by mouth 2 (two) times daily.      . donepezil (ARICEPT) 5 MG tablet Take 1 tablet (5 mg total) by mouth at bedtime.  30 tablet  2  . fluticasone (FLONASE) 50 MCG/ACT nasal spray Place 2 sprays into the nose daily.  16 g  2  . metoprolol tartrate (LOPRESSOR) 25 MG tablet Take 1/2 tablet daily for heart  30 tablet  3  . Multiple Vitamin (MULTIVITAMIN) tablet Take 1 tablet by mouth daily.        . oxybutynin (DITROPAN) 5 MG tablet Take 1 tablet (5 mg total) by mouth 2 (two) times daily.  60 tablet  1  . ranitidine (ZANTAC) 150 MG capsule Take 1 capsule (150 mg total) by mouth 2 (two) times daily. Dose decrease  60 capsule  6  . simvastatin (ZOCOR) 40 MG tablet Take 40 mg by mouth every evening.      . traZODone (DESYREL) 50 MG tablet Take 50 mg by mouth at bedtime.      . DISCONTD: traZODone (DESYREL) 50 MG tablet Take 1 tablet (50 mg total) by mouth at bedtime.  30 tablet  3  . omeprazole (PRILOSEC) 20 MG capsule   Take 1 capsule (20 mg total) by mouth daily.  30 capsule  2  . traZODone (DESYREL) 50 MG tablet Take 1 tablet (50 mg total) by mouth at bedtime.  30 tablet  1  . DISCONTD: loratadine (CLARITIN) 10 MG tablet Take 1 tablet (10 mg total) by mouth daily.  30 tablet  3  . DISCONTD: omeprazole (PRILOSEC) 20 MG capsule Take 1 capsule (20 mg total) by mouth daily.  30 capsule  2    Allergies as of 09/07/2011 - Review Complete 09/07/2011  Allergen Reaction Noted  . Remeron (mirtazapine)  03/15/2011    No family history on file.There is no known family history of colorectal carcinoma , liver disease, or inflammatory bowel disease.   History   Social History  . Marital Status: Widowed    Spouse Name: N/A    Number of Children: 8  . Years of Education: N/A   Occupational History  . retired    Social History Main Topics  . Smoking status: Never Smoker   . Smokeless tobacco: Not on file  . Alcohol Use: No   . Drug Use: No  . Sexually Active: Not on file   Other Topics Concern  . Not on file   Social History Narrative   Lives w/ daughter.  Moved from [place] 2009.4 biological children4 adopted children    Review of Systems: Gen: Denies any fever, chills, sweats, anorexia, fatigue, weakness, malaise, weight loss, and sleep disorder CV: Denies chest pain, angina, palpitations, syncope, orthopnea, PND, peripheral edema, and claudication. Resp: Denies dyspnea at rest, dyspnea with exercise, cough, sputum, wheezing, coughing up blood, and pleurisy. GI: Denies vomiting blood, jaundice, and fecal incontinence.    GU : Denies urinary burning, blood in urine, urinary frequency, urinary hesitancy, nocturnal urination, and urinary incontinence. MS: Denies joint pain, limitation of movement, and swelling, stiffness, low back pain, extremity pain. Denies muscle weakness, cramps, atrophy.  Derm: Denies rash, itching, dry skin, hives, moles, warts, or unhealing ulcers.  Psych: Denies depression, anxiety, memory loss, suicidal ideation, hallucinations, paranoia, and confusion. Heme: Denies bruising, bleeding, and enlarged lymph nodes. Neuro:  Denies any headaches, dizziness, paresthesias. Endo:  Denies any problems with DM, thyroid, adrenal function.  Physical Exam: BP 142/81  Pulse 56  Temp 98.4 F (36.9 C) (Temporal)  Ht 5' 9" (1.753 m)  Wt 183 lb 12.8 oz (83.371 kg)  BMI 27.14 kg/m2 General:   Alert,  Well-developed, well-nourished, pleasant and cooperative in NAD Head:  Normocephalic and atraumatic. Eyes:  Sclera clear, no icterus.   Conjunctiva pink. Ears:  Normal auditory acuity. Nose:  No deformity, discharge, or lesions. Mouth:  No deformity or lesions,oropharynx pink & moist. Neck:  Supple; no masses or thyromegaly. Lungs:  Clear throughout to auscultation.   No wheezes, crackles, or rhonchi. No acute distress. Heart:  Regular rate and rhythm; no murmurs, clicks, rubs,  or  gallops. Abdomen:  Normal bowel sounds.  No bruits.  Soft, non-tender and non-distended without masses, hepatosplenomegaly or hernias noted.  No guarding or rebound tenderness.   Rectal:  Deferred. Msk:  Symmetrical without gross deformities. Normal posture. Pulses:  Normal pulses noted. Extremities:  No clubbing or edema. Neurologic:  Alert and oriented x4;  grossly normal neurologically. Skin:  Intact without significant lesions or rashes. Lymph Nodes:  No significant cervical adenopathy. Psych:  Alert and cooperative. Normal mood and affect. ' 

## 2011-09-20 NOTE — OR Nursing (Signed)
Randel Books, daughter, pt's wallet.

## 2011-09-20 NOTE — Op Note (Signed)
[facility] [address] [place], [place]  [address]  ENDOSCOPY PROCEDURE REPORT  PATIENT:  Alex, Williamson  MR#:  [id number] BIRTHDATE:  1927-12-25, 84 yrs. old  GENDER:  male  ENDOSCOPIST:  R. Roetta Sessions, MD Caleen Essex Referred by:  Milinda Antis, M.D.  PROCEDURE DATE:  09/20/2011 PROCEDURE:  EGD with Elease Hashimoto dilation, biopsy  INDICATIONS:  esophageal dysphagia  INFORMED CONSENT:   The risks, benefits, limitations, alternatives and imponderables have been discussed.  The potential for biopsy, esophogeal dilation, etc. have also been reviewed.  Questions have been answered.  All parties agreeable.  Please see the history and physical in the medical record for more information.  MEDICATIONS:Versed 3 mg IV and Demerol 50 mg IV in divided doses. Cetacaine spray.  DESCRIPTION OF PROCEDURE:   The EG-[id number] ([id number]) endoscope was introduced through the mouth and advanced to the second portion of the duodenum without difficulty or limitations.  The mucosal surfaces were surveyed very carefully during advancement of the scope and upon withdrawal.  Retroflexion view of the proximal stomach and esophagogastric junction was performed.  <<PROCEDUREIMAGES>>  FINDINGS:  prominent Schatzki's ring; otherwise, normal esophagus. Stomach empty. Small hiatal hernia. Antral erosions. No ulcer or infiltrating process. Patent pylorus. Normal first and second portion of the duodenum  THERAPEUTIC / DIAGNOSTIC MANEUVERS PERFORMED: A 56 French Maloney dilator was passed to full insertion easily. A look back revealed the ringhad been nicely disrupted without apparent complication. Subsequently, biopsies of the abnormal antrum taken for histologic study.  COMPLICATIONS:   None  IMPRESSION:  Schatzki's ring-status post dilation. Hiatal hernia. Antral erosions-status post biopsy  RECOMMENDATIONS:  Followup on pathology. Continue omeprazole 20 mg orally  daily.  ______________________________ R. Roetta Sessions, MD Caleen Essex  CC:  n. eSIGNED:   R. Roetta Sessions at 09/20/2011 02:59 PM  Matthew Saras,   [phone]

## 2011-09-20 NOTE — Interval H&P Note (Signed)
History and Physical Interval Note:  09/20/2011 2:31 PM  Alex Williamson  has presented today for surgery, with the diagnosis of dysphagia  The various methods of treatment have been discussed with the patient and family. After consideration of risks, benefits and other options for treatment, the patient has consented to  Procedure(s) (LRB): ESOPHAGOGASTRODUODENOSCOPY (EGD) WITH ESOPHAGEAL DILATION (N/A) SAVORY DILATION (N/A) MALONEY DILATION (N/A) as a surgical intervention .  The patient's history has been reviewed, patient examined, no change in status, stable for surgery.  I have reviewed the patients' chart and labs.  Questions were answered to the patient's satisfaction.     Eula Listen

## 2011-09-23 ENCOUNTER — Encounter: Payer: Self-pay | Admitting: *Deleted

## 2011-09-23 ENCOUNTER — Encounter: Payer: Self-pay | Admitting: Internal Medicine

## 2011-09-27 ENCOUNTER — Encounter: Payer: Self-pay | Admitting: Internal Medicine

## 2011-09-28 ENCOUNTER — Ambulatory Visit (HOSPITAL_COMMUNITY)
Admission: RE | Admit: 2011-09-28 | Discharge: 2011-09-28 | Disposition: A | Discharge status: Home / Self Care | Payer: MEDICARE | Source: Ambulatory Visit | Attending: Gastroenterology | Admitting: Gastroenterology

## 2011-09-28 ENCOUNTER — Encounter: Payer: Self-pay | Admitting: Gastroenterology

## 2011-09-28 ENCOUNTER — Ambulatory Visit (INDEPENDENT_AMBULATORY_CARE_PROVIDER_SITE_OTHER): Payer: MEDICARE | Admitting: Gastroenterology

## 2011-09-28 VITALS — BP 125/74 | HR 58 | Temp 97.8°F | Ht 69.0 in | Wt 181.2 lb

## 2011-09-28 DIAGNOSIS — R059 Cough, unspecified: Secondary | ICD-10-CM | POA: Insufficient documentation

## 2011-09-28 DIAGNOSIS — R0989 Other specified symptoms and signs involving the circulatory and respiratory systems: Secondary | ICD-10-CM | POA: Insufficient documentation

## 2011-09-28 DIAGNOSIS — R05 Cough: Secondary | ICD-10-CM

## 2011-09-28 NOTE — Progress Notes (Signed)
Faxed to PCP

## 2011-09-28 NOTE — Progress Notes (Signed)
Primary Care Physician: Milinda Antis, MD  Primary Gastroenterologist:  Roetta Sessions, MD   Chief Complaint  Patient presents with  . Dysphagia    HPI: Alex Williamson is a 78 y.o. male here for followup of dysphagia. He was seen in the office on 09/07/2011 for the same. On July 16 he had an upper endoscopy with esophageal dilation of Schatzki ring. He had a few antral erosions, biopsy consistent with gastritis but no H. Pylori. He has been on omeprazole now for just a few weeks.  Food goes down fine. Clears thick sputum from chest and  coughing for at least one month. Feels like drowning by nighttime when lays down. Tried antibiotics in past by PCP. No odynophagia. Taking omeprazole daily. No vomiting. Denies post nasal drip.    No prior colonoscopy.  Current Outpatient Prescriptions  Medication Sig Dispense Refill  . aspirin 81 MG tablet Take 81 mg by mouth daily.       Marland Kitchen docusate sodium (COLACE) 100 MG capsule Take 100 mg by mouth 2 (two) times daily.      Marland Kitchen donepezil (ARICEPT) 5 MG tablet Take 1 tablet (5 mg total) by mouth at bedtime.  30 tablet  2  . metoprolol tartrate (LOPRESSOR) 25 MG tablet Take 12.5 mg by mouth daily. Take 1/2 tablet daily for heart      . Multiple Vitamin (MULTIVITAMIN) tablet Take 1 tablet by mouth daily.        Marland Kitchen omeprazole (PRILOSEC) 20 MG capsule Take 1 capsule (20 mg total) by mouth daily.  30 capsule  2  . oxybutynin (DITROPAN) 5 MG tablet Take 1 tablet (5 mg total) by mouth 2 (two) times daily.  60 tablet  1  . ranitidine (ZANTAC) 150 MG capsule Take 1 capsule (150 mg total) by mouth 2 (two) times daily. Dose decrease  60 capsule  6  . simvastatin (ZOCOR) 40 MG tablet Take 40 mg by mouth every evening.      . traZODone (DESYREL) 50 MG tablet Take 1 tablet (50 mg total) by mouth at bedtime.  30 tablet  1  . DISCONTD: traZODone (DESYREL) 50 MG tablet Take 50 mg by mouth at bedtime.      Marland Kitchen DISCONTD: loratadine (CLARITIN) 10 MG tablet Take 1 tablet (10  mg total) by mouth daily.  30 tablet  3    Allergies as of 09/28/2011 - Review Complete 09/28/2011  Allergen Reaction Noted  . Remeron (mirtazapine)  03/15/2011    ROS:  General: Negative for anorexia, unintentional weight loss, fever, chills, fatigue, weakness. ENT: Negative for hoarseness, difficulty swallowing , nasal congestion. CV: Negative for chest pain, angina, palpitations, dyspnea on exertion, peripheral edema.  Respiratory: Negative for dyspnea at rest, dyspnea on exertion, cough, sputum, wheezing.  GI: See history of present illness. GU:  Negative for dysuria, hematuria, urinary incontinence, urinary frequency, nocturnal urination.  Endo: Negative for unusual weight change.    Physical Examination:   BP 125/74  Pulse 58  Temp 97.8 F (36.6 C) (Temporal)  Ht 5\' 9"  (1.753 m)  Wt 181 lb 3.2 oz (82.192 kg)  BMI 26.76 kg/m2  General: Well-nourished, well-developed in no acute distress. Accompanied by daughter.  Eyes: No icterus. Mouth: Oropharyngeal mucosa moist and pink , no lesions erythema or exudate. Lungs: Clear to auscultation bilaterally.  Heart: Regular rate and rhythm, no murmurs rubs or gallops.  Abdomen: Bowel sounds are normal, nontender, nondistended, no hepatosplenomegaly or masses, no abdominal bruits or hernia , no  rebound or guarding.   Extremities: No lower extremity edema. No clubbing or deformities. Neuro: Alert and oriented x 4   Skin: Warm and dry, no jaundice.   Psych: Alert and cooperative, normal mood and affect.

## 2011-09-28 NOTE — Patient Instructions (Addendum)
Please continue omeprazole 20mg  daily. Please have your chest xray done.

## 2011-09-28 NOTE — Progress Notes (Signed)
Quick Note:  Please let pt know, his CXR is normal. I would recommend barium pill esophagram to evaluate his swallowing/throat clearing and to rule out zenker's diverticulum as next step. ______

## 2011-09-28 NOTE — Assessment & Plan Note (Signed)
Complains of constant clearing of throat/chest, coughing. Denies esophageal dysphagia. No difference in symptoms with recent EGD/ED. CXR PA/lateral.

## 2011-09-30 NOTE — Progress Notes (Signed)
Quick Note:  Letter mailed to pt ______ 

## 2011-10-06 ENCOUNTER — Ambulatory Visit (INDEPENDENT_AMBULATORY_CARE_PROVIDER_SITE_OTHER): Payer: MEDICARE | Admitting: Family Medicine

## 2011-10-06 ENCOUNTER — Encounter: Payer: Self-pay | Admitting: Family Medicine

## 2011-10-06 VITALS — BP 138/62 | HR 54 | Resp 18 | Ht 71.0 in | Wt 182.0 lb

## 2011-10-06 DIAGNOSIS — R05 Cough: Secondary | ICD-10-CM

## 2011-10-06 DIAGNOSIS — K117 Disturbances of salivary secretion: Secondary | ICD-10-CM

## 2011-10-06 DIAGNOSIS — R059 Cough, unspecified: Secondary | ICD-10-CM

## 2011-10-06 DIAGNOSIS — R093 Abnormal sputum: Secondary | ICD-10-CM

## 2011-10-06 DIAGNOSIS — F0150 Vascular dementia without behavioral disturbance: Secondary | ICD-10-CM

## 2011-10-06 DIAGNOSIS — R682 Dry mouth, unspecified: Secondary | ICD-10-CM | POA: Insufficient documentation

## 2011-10-06 MED ORDER — DONEPEZIL HCL 10 MG PO TABS: 10.0000 mg | ORAL_TABLET | Freq: Every day | ORAL | Status: DC

## 2011-10-06 NOTE — Patient Instructions (Signed)
CT of chest to be done  Memory medication increased to 10mg  ( Aricept) Stop the Zantac  Continue the omeprazole  F/U in 3 months

## 2011-10-06 NOTE — Assessment & Plan Note (Signed)
Vascular dementia with memory loss Increase aricept to 10mg 

## 2011-10-06 NOTE — Assessment & Plan Note (Signed)
Chronic problem,pt seen by ENT without help, GI with no change in symptoms either, will obtain CT chest  Sputum culture have been negative but he has been treated with antibiotics in the past as well

## 2011-10-06 NOTE — Assessment & Plan Note (Signed)
Secondary to ditropan, discussed stopping medication, pt declined because of his incontinence

## 2011-10-06 NOTE — Progress Notes (Signed)
  Subjective:    Patient ID: Alex Williamson, male    DOB: 1927-05-10, 78 y.o.   MRN: [id number]  HPI Patient presents with chronic sputum production. He is complaining about this since I her initial visit in August of 2012. He's been seen by ear nose and throat he's been placed on antibiotics as well as Mucinex and Magic mouthwash. He's tried Tussionex home for the cough with production. He was recently seen by gastroenterology and had esophageal dilatation done which does not resolve symptoms. He was also told to discontinue Zantac and continue omeprazole however it appears he is still taking both of them. He has not had any specific change in his sputum production with these. Get a chest x-ray which was negative. He did have a CT of chest in June of 2012 which showed some lymphadenopathy and postsurgical changes from his CABG but otherwise normal. He also notes that he has had increased dry mouth over the past couple of months.   Review of Systems - per above    GEN- denies fatigue, fever, weight loss,weakness, recent illness HEENT- denies eye drainage, change in vision, nasal discharge, CVS- denies chest pain, palpitations RESP- denies SOB, cough, wheeze ABD- denies N/V, change in stools, abd pain GU- denies dysuria, hematuria, dribbling, incontinence MSK- denies joint pain, muscle aches, injury Neuro- denies headache, dizziness, syncope, seizure activity \    Objective:   Physical Exam GEN- NAD, alert and oriented x 3, poor memory HEENT-MMM, oropharynx clear, poor vision, dry tongue CVS- RRR, no murmur RESP-CTAB ABD-NABS,NT,ND, EXT- No edema-  Pulse-2+       Assessment & Plan:

## 2011-10-11 ENCOUNTER — Ambulatory Visit (HOSPITAL_COMMUNITY)
Admission: RE | Admit: 2011-10-11 | Discharge: 2011-10-11 | Disposition: A | Discharge status: Home / Self Care | Payer: MEDICARE | Source: Ambulatory Visit | Attending: Family Medicine | Admitting: Family Medicine

## 2011-10-11 DIAGNOSIS — R093 Abnormal sputum: Secondary | ICD-10-CM

## 2011-10-11 DIAGNOSIS — R059 Cough, unspecified: Secondary | ICD-10-CM | POA: Insufficient documentation

## 2011-10-11 DIAGNOSIS — R05 Cough: Secondary | ICD-10-CM | POA: Insufficient documentation

## 2011-10-20 ENCOUNTER — Ambulatory Visit (INDEPENDENT_AMBULATORY_CARE_PROVIDER_SITE_OTHER): Payer: MEDICARE | Admitting: Otolaryngology

## 2011-10-20 DIAGNOSIS — K117 Disturbances of salivary secretion: Secondary | ICD-10-CM

## 2011-10-25 ENCOUNTER — Other Ambulatory Visit: Payer: Self-pay

## 2011-10-25 MED ORDER — SIMVASTATIN 40 MG PO TABS: 40.0000 mg | ORAL_TABLET | Freq: Every evening | ORAL | Status: DC

## 2011-11-11 ENCOUNTER — Other Ambulatory Visit: Payer: Self-pay

## 2011-11-11 MED ORDER — TRAZODONE HCL 50 MG PO TABS: 50.0000 mg | ORAL_TABLET | Freq: Every day | ORAL | Status: DC

## 2011-11-11 MED ORDER — DONEPEZIL HCL 10 MG PO TABS: 10.0000 mg | ORAL_TABLET | Freq: Every day | ORAL | Status: DC

## 2011-11-11 MED ORDER — OXYBUTYNIN CHLORIDE 5 MG PO TABS: 5.0000 mg | ORAL_TABLET | Freq: Two times a day (BID) | ORAL | Status: DC

## 2011-11-30 ENCOUNTER — Telehealth: Payer: Self-pay | Admitting: Family Medicine

## 2011-11-30 MED ORDER — TRAZODONE HCL 50 MG PO TABS: 50.0000 mg | ORAL_TABLET | Freq: Every day | ORAL | Status: DC

## 2011-11-30 MED ORDER — OXYBUTYNIN CHLORIDE 5 MG PO TABS: 5.0000 mg | ORAL_TABLET | Freq: Two times a day (BID) | ORAL | Status: DC

## 2011-11-30 NOTE — Telephone Encounter (Signed)
Sent in

## 2011-12-14 ENCOUNTER — Other Ambulatory Visit: Payer: Self-pay

## 2011-12-14 MED ORDER — METOPROLOL TARTRATE 25 MG PO TABS: 12.5000 mg | ORAL_TABLET | Freq: Every day | ORAL | Status: DC

## 2011-12-14 MED ORDER — OXYBUTYNIN CHLORIDE 5 MG PO TABS: 5.0000 mg | ORAL_TABLET | Freq: Two times a day (BID) | ORAL | Status: DC

## 2011-12-14 MED ORDER — DONEPEZIL HCL 10 MG PO TABS: 10.0000 mg | ORAL_TABLET | Freq: Every day | ORAL | Status: DC

## 2011-12-21 ENCOUNTER — Other Ambulatory Visit: Payer: Self-pay

## 2011-12-21 MED ORDER — OMEPRAZOLE 20 MG PO CPDR: 20.0000 mg | DELAYED_RELEASE_CAPSULE | Freq: Every day | ORAL | Status: DC

## 2012-01-25 ENCOUNTER — Other Ambulatory Visit: Payer: Self-pay | Admitting: Family Medicine

## 2012-02-08 ENCOUNTER — Other Ambulatory Visit: Payer: Self-pay

## 2012-02-08 MED ORDER — DONEPEZIL HCL 10 MG PO TABS: 10.0000 mg | ORAL_TABLET | Freq: Every day | ORAL | Status: DC

## 2012-03-29 ENCOUNTER — Ambulatory Visit: Payer: MEDICARE | Admitting: Family Medicine

## 2012-04-02 ENCOUNTER — Ambulatory Visit: Payer: MEDICARE | Admitting: Family Medicine

## 2012-04-03 ENCOUNTER — Ambulatory Visit: Payer: MEDICARE | Admitting: Family Medicine

## 2012-04-04 ENCOUNTER — Other Ambulatory Visit: Payer: Self-pay | Admitting: Family Medicine

## 2012-04-10 ENCOUNTER — Ambulatory Visit (INDEPENDENT_AMBULATORY_CARE_PROVIDER_SITE_OTHER): Payer: MEDICARE | Admitting: Family Medicine

## 2012-04-10 ENCOUNTER — Encounter: Payer: Self-pay | Admitting: Family Medicine

## 2012-04-10 VITALS — BP 122/80 | HR 57 | Resp 16 | Ht 71.0 in | Wt 188.0 lb

## 2012-04-10 DIAGNOSIS — F3289 Other specified depressive episodes: Secondary | ICD-10-CM

## 2012-04-10 DIAGNOSIS — E785 Hyperlipidemia, unspecified: Secondary | ICD-10-CM

## 2012-04-10 DIAGNOSIS — F32A Depression, unspecified: Secondary | ICD-10-CM

## 2012-04-10 DIAGNOSIS — R32 Unspecified urinary incontinence: Secondary | ICD-10-CM

## 2012-04-10 DIAGNOSIS — N182 Chronic kidney disease, stage 2 (mild): Secondary | ICD-10-CM

## 2012-04-10 DIAGNOSIS — I10 Essential (primary) hypertension: Secondary | ICD-10-CM

## 2012-04-10 DIAGNOSIS — F329 Major depressive disorder, single episode, unspecified: Secondary | ICD-10-CM

## 2012-04-10 DIAGNOSIS — F0150 Vascular dementia without behavioral disturbance: Secondary | ICD-10-CM

## 2012-04-10 MED ORDER — MEMANTINE HCL 5 MG PO TABS: 5.0000 mg | ORAL_TABLET | Freq: Every day | ORAL | Status: DC

## 2012-04-10 NOTE — Progress Notes (Signed)
  Subjective:    Patient ID: Alex Williamson, male    DOB: May 06, 1927, 79 y.o.   MRN: [id number]  HPI  Pt here to f/u chronic medical problems. His daughter is concerned that he has had increased episodes of incontinence during the day however no trouble at night. She is also worried about his memory he is taking the Aricept however continues to have any bad days where he gets confused with mass and word searching. He's not had any behavioral problems he's not been wandering at night. They try to get him out some and taken off her meals as well as to the stores.  Review of Systems   GEN- denies fatigue, fever, weight loss,weakness, recent illness HEENT- denies eye drainage, change in vision, nasal discharge, CVS- denies chest pain, palpitations RESP- denies SOB, cough, wheeze ABD- denies N/V, change in stools, abd pain GU- denies dysuria, hematuria, dribbling,+ incontinence MSK- denies joint pain, muscle aches, injury Neuro- denies headache, dizziness, syncope, seizure activity      Objective:   Physical Exam  GEN- NAD, alert and oriented x3 HEENT- PERRL, EOMI, non injected sclera, pink conjunctiva, MMM, oropharynx clear Neck- Supple, no thryomegaly CVS- RRR, no murmur RESP-CTAB ABD-NABS,soft,NT,ND EXT- No edema Pulses- Radial, DP- 2+ Psych-normal affect and mood      Assessment & Plan:

## 2012-04-10 NOTE — Patient Instructions (Addendum)
Continue current medication  Start Namenda , to go along with Aricept Use depends  Continue current dose of bladder medication Bedside commode F/U 4 months

## 2012-04-11 NOTE — Assessment & Plan Note (Signed)
On statin, check FLP

## 2012-04-11 NOTE — Assessment & Plan Note (Signed)
Will add namenda to aricept

## 2012-04-11 NOTE — Assessment & Plan Note (Signed)
Well controlled 

## 2012-04-11 NOTE — Assessment & Plan Note (Signed)
Unfortunately these meds work against his aricept. Will keep at current dose, advised depends during day Get bedside commode to assist at night

## 2012-04-11 NOTE — Assessment & Plan Note (Signed)
Recheck renal function. ?

## 2012-04-11 NOTE — Assessment & Plan Note (Signed)
Overall doing well on trazodone

## 2012-04-14 LAB — CBC
HCT: 46.5 % (ref 39.0–52.0)
MCH: 31 pg (ref 26.0–34.0)
MCV: 89.4 fL (ref 78.0–100.0)
Platelets: 164 10*3/uL (ref 150–400)
RDW: 14 % (ref 11.5–15.5)
WBC: 7.5 10*3/uL (ref 4.0–10.5)

## 2012-04-14 LAB — LIPID PANEL
HDL: 57 mg/dL (ref >39)
LDL Cholesterol: 123 mg/dL — ABNORMAL HIGH (ref 0–99)
Total CHOL/HDL Ratio: 3.8 Ratio

## 2012-04-14 LAB — COMPREHENSIVE METABOLIC PANEL
ALT: 12 U/L (ref 0–53)
Alkaline Phosphatase: 51 U/L (ref 39–117)
CO2: 29 mEq/L (ref 19–32)
Creat: 1.39 mg/dL — ABNORMAL HIGH (ref 0.50–1.35)
Sodium: 141 mEq/L (ref 135–145)
Total Bilirubin: 0.6 mg/dL (ref 0.3–1.2)
Total Protein: 7.1 g/dL (ref 6.0–8.3)

## 2012-04-16 MED ORDER — ATORVASTATIN CALCIUM 20 MG PO TABS: 20.0000 mg | ORAL_TABLET | Freq: Every day | ORAL | Status: DC

## 2012-04-16 NOTE — Addendum Note (Signed)
Addended by: Milinda Antis F on: 04/16/2012 12:54 PM   Modules accepted: Orders, Medications

## 2012-04-18 ENCOUNTER — Telehealth: Payer: Self-pay

## 2012-04-18 NOTE — Telephone Encounter (Signed)
Please call pharmacy and see if this needs a PA or what, it should be covered

## 2012-04-19 NOTE — Telephone Encounter (Signed)
Med doesn't need PA but the copay is too much for the family to afford.

## 2012-04-23 NOTE — Telephone Encounter (Signed)
Please let nurse know, that there is no other medication to add, since he is unable to afford the namenda. Instead, I discussed with his daughter his bladder pill- Ditropan does work against the memory medicine, he needs to wear depends. For now decrease his bladder pill to once a day at bedtime, take Aricept in the morning, we will see if this helps the memory.

## 2012-04-25 NOTE — Telephone Encounter (Signed)
Patients daughetr aware

## 2012-05-02 ENCOUNTER — Other Ambulatory Visit: Payer: Self-pay | Admitting: Family Medicine

## 2012-05-02 ENCOUNTER — Telehealth: Payer: Self-pay

## 2012-05-02 NOTE — Telephone Encounter (Signed)
States pt has been having incontinence episodes and urgency for a few days now, some confusion and also feels like he cannot empty his bladder completely. We went ahead and did a UA and we should be getting the results tomorrow

## 2012-05-03 NOTE — Telephone Encounter (Signed)
I spoke with patient's daughter he had some mild confusion after taking his Aricept the morning but that resolved his urinalysis and culture were negative he's only had one episode of incontinence since her last visit he's taken his bladder pill now at bedtime and is otherwise doing well. They're to call back if they have any concerns

## 2012-05-03 NOTE — Telephone Encounter (Signed)
UA results received

## 2012-05-03 NOTE — Telephone Encounter (Signed)
Can you call [facility] for the UA results

## 2012-05-15 ENCOUNTER — Telehealth: Payer: Self-pay | Admitting: Family Medicine

## 2012-05-16 NOTE — Telephone Encounter (Signed)
Called patient and left message for them to return call at the office   

## 2012-05-20 ENCOUNTER — Other Ambulatory Visit: Payer: Self-pay | Admitting: Family Medicine

## 2012-05-21 ENCOUNTER — Telehealth: Payer: Self-pay | Admitting: Family Medicine

## 2012-05-21 NOTE — Telephone Encounter (Signed)
Advise them they can leave this off for the next few days and try again, with 1/2 tablet in 1 week

## 2012-05-23 ENCOUNTER — Telehealth: Payer: Self-pay | Admitting: Family Medicine

## 2012-05-23 NOTE — Telephone Encounter (Signed)
Called back and the number was no longer in service

## 2012-05-24 NOTE — Telephone Encounter (Signed)
Called and left message for daughter to return call

## 2012-05-25 ENCOUNTER — Encounter: Payer: Self-pay | Admitting: Family Medicine

## 2012-06-08 NOTE — Telephone Encounter (Signed)
Spoke with patient and he states that he is not having any allergy symptoms and that he is sleeping well.  Also states that daughter is not available as she has moved to [place].  His granddaughter Amy is living with him.

## 2012-06-08 NOTE — Telephone Encounter (Signed)
If he has allergies he can take claritin once a day or benadyl  He already has sleeping medication trazodone he should take at bedtime He should not add anything else over the counter

## 2012-06-18 ENCOUNTER — Other Ambulatory Visit: Payer: Self-pay | Admitting: Family Medicine

## 2012-06-19 ENCOUNTER — Telehealth: Payer: Self-pay | Admitting: Family Medicine

## 2012-06-19 MED ORDER — ATORVASTATIN CALCIUM 20 MG PO TABS: 20.0000 mg | ORAL_TABLET | Freq: Every day | ORAL | Status: DC

## 2012-06-19 MED ORDER — DONEPEZIL HCL 10 MG PO TABS: ORAL_TABLET | ORAL | Status: DC

## 2012-06-19 NOTE — Telephone Encounter (Signed)
Refill sent in

## 2012-06-25 ENCOUNTER — Ambulatory Visit (INDEPENDENT_AMBULATORY_CARE_PROVIDER_SITE_OTHER): Payer: MEDICARE | Admitting: Family Medicine

## 2012-06-25 ENCOUNTER — Encounter: Payer: Self-pay | Admitting: Family Medicine

## 2012-06-25 VITALS — BP 130/72 | HR 64 | Resp 18 | Ht 71.0 in | Wt 180.1 lb

## 2012-06-25 DIAGNOSIS — F0150 Vascular dementia without behavioral disturbance: Secondary | ICD-10-CM

## 2012-06-25 DIAGNOSIS — K219 Gastro-esophageal reflux disease without esophagitis: Secondary | ICD-10-CM

## 2012-06-25 DIAGNOSIS — R443 Hallucinations, unspecified: Secondary | ICD-10-CM

## 2012-06-25 DIAGNOSIS — R32 Unspecified urinary incontinence: Secondary | ICD-10-CM

## 2012-06-25 MED ORDER — OXYBUTYNIN CHLORIDE 5 MG PO TABS: ORAL_TABLET | ORAL | Status: DC

## 2012-06-25 NOTE — Patient Instructions (Signed)
Go ahead and stop the aricept Give him 1 bladder pill twice a day Continue the other medications listed above Call for severe agitation F/U 3 months

## 2012-06-27 ENCOUNTER — Encounter: Payer: Self-pay | Admitting: Family Medicine

## 2012-06-27 NOTE — Assessment & Plan Note (Signed)
This likley related to his dementia, possible underlying mood disorder Trazodone stopped at family request

## 2012-06-27 NOTE — Assessment & Plan Note (Signed)
I think the event described was likley GERD, advised to take PPI

## 2012-06-27 NOTE — Assessment & Plan Note (Signed)
Stop aricept discussed probable worsening of dementia over time, if agitation arises consider other medication such as seroquel

## 2012-06-27 NOTE — Assessment & Plan Note (Signed)
Focus on more quality of life, his incontinence which did well with increased med is of more importance to him and family, will change back to BID dosing

## 2012-06-27 NOTE — Progress Notes (Signed)
  Subjective:    Patient ID: Alex Williamson, male    DOB: 1927-12-07, 79 y.o.   MRN: [id number]  HPI  Pt here with his daughter [name] who has HPOA. He wants to stop his memory meds and the trazodone, she thinks the trazodone may be causing worsening hallucinations, so she stopped it and past few days he has been fine. He is more concerned about his bladder, and wants to take the bladder medication twice a day.  They also requested results of the testing done by Dr. Kieth Brightly. He had an episode of severe indigestion felt like his throat was on fire, he had taken his regular BP medications and Aspirin, it resolved without any other intervention, PPI was not given  Review of Systems  GEN- denies fatigue, fever, weight loss,weakness, recent illness HEENT- denies eye drainage, change in vision, nasal discharge, CVS- denies chest pain, palpitations RESP- denies SOB, cough, wheeze ABD- denies N/V, change in stools, abd pain GU- denies dysuria, hematuria, dribbling, incontinence MSK- denies joint pain, muscle aches, injury Neuro- denies headache, dizziness, syncope, seizure activity      Objective:   Physical Exam GEN- NAD, alert and oriented x3 HEENT- PERRL, EOMI, non injected sclera, pink conjunctiva, MMM, oropharynx clear CVS- RRR, no murmur RESP-CTAB ABD-NABS,soft,NT,ND EXT- No edema Pulses- Radial 2+ Psych- normal affect and mood, no hallucinations       Assessment & Plan:   He wishes to continue all heart medications

## 2012-08-21 ENCOUNTER — Encounter (HOSPITAL_COMMUNITY): Payer: Self-pay | Admitting: Emergency Medicine

## 2012-08-21 ENCOUNTER — Emergency Department (HOSPITAL_COMMUNITY): Payer: BC Managed Care – PPO

## 2012-08-21 ENCOUNTER — Emergency Department (HOSPITAL_COMMUNITY)
Admission: EM | Admit: 2012-08-21 | Discharge: 2012-08-21 | Disposition: A | Discharge status: Home / Self Care | Payer: BC Managed Care – PPO | Attending: Emergency Medicine | Admitting: Emergency Medicine

## 2012-08-21 DIAGNOSIS — I252 Old myocardial infarction: Secondary | ICD-10-CM | POA: Insufficient documentation

## 2012-08-21 DIAGNOSIS — N182 Chronic kidney disease, stage 2 (mild): Secondary | ICD-10-CM | POA: Insufficient documentation

## 2012-08-21 DIAGNOSIS — E785 Hyperlipidemia, unspecified: Secondary | ICD-10-CM | POA: Insufficient documentation

## 2012-08-21 DIAGNOSIS — I129 Hypertensive chronic kidney disease with stage 1 through stage 4 chronic kidney disease, or unspecified chronic kidney disease: Secondary | ICD-10-CM | POA: Insufficient documentation

## 2012-08-21 DIAGNOSIS — Z8679 Personal history of other diseases of the circulatory system: Secondary | ICD-10-CM | POA: Insufficient documentation

## 2012-08-21 DIAGNOSIS — R059 Cough, unspecified: Secondary | ICD-10-CM | POA: Insufficient documentation

## 2012-08-21 DIAGNOSIS — Z79899 Other long term (current) drug therapy: Secondary | ICD-10-CM | POA: Insufficient documentation

## 2012-08-21 DIAGNOSIS — Z951 Presence of aortocoronary bypass graft: Secondary | ICD-10-CM | POA: Insufficient documentation

## 2012-08-21 DIAGNOSIS — Z7982 Long term (current) use of aspirin: Secondary | ICD-10-CM | POA: Insufficient documentation

## 2012-08-21 DIAGNOSIS — Z8673 Personal history of transient ischemic attack (TIA), and cerebral infarction without residual deficits: Secondary | ICD-10-CM | POA: Insufficient documentation

## 2012-08-21 DIAGNOSIS — K219 Gastro-esophageal reflux disease without esophagitis: Secondary | ICD-10-CM | POA: Insufficient documentation

## 2012-08-21 DIAGNOSIS — R05 Cough: Secondary | ICD-10-CM | POA: Insufficient documentation

## 2012-08-21 DIAGNOSIS — Z8669 Personal history of other diseases of the nervous system and sense organs: Secondary | ICD-10-CM | POA: Insufficient documentation

## 2012-08-21 DIAGNOSIS — Z8659 Personal history of other mental and behavioral disorders: Secondary | ICD-10-CM | POA: Insufficient documentation

## 2012-08-21 MED ORDER — PREDNISONE 20 MG PO TABS: 20.0000 mg | ORAL_TABLET | Freq: Every day | ORAL | Status: DC

## 2012-08-21 MED ORDER — LORATADINE 10 MG PO TABS: 10.0000 mg | ORAL_TABLET | Freq: Every day | ORAL | Status: DC

## 2012-08-21 MED ORDER — ALBUTEROL SULFATE HFA 108 (90 BASE) MCG/ACT IN AERS: 2.0000 | INHALATION_SPRAY | RESPIRATORY_TRACT | Status: DC | PRN

## 2012-08-21 NOTE — ED Provider Notes (Signed)
History     This chart was scribed for Donnetta Hutching, MD, MD by Smitty Pluck, ED Scribe. The patient was seen in room APA06/APA06 and the patient's care was started at 7:27 AM.   CSN: [id number]  Arrival date & time 08/21/12  0707     Chief Complaint  Patient presents with  . Cough     The history is provided by the patient, medical records and a relative. No language interpreter was used.   HPI Comments: Alex Williamson is a 79 y.o. male with hx of HTN, CVA, GERD and ASCVD who presents to the Emergency Department complaining of constant, moderate productive cough with clear sputum that is worse at night that has been ongoing for awhile but worsened within the past 2 days. Pt reports having superior sternal pain. Pt has taken mucinex without relief. Pt denies fever, chills, nausea, vomiting, diarrhea, weakness, SOB and any other pain. Pt is scheduled to see Dr Jeanice Lim in one week. He has seen her for same symptoms in the past and he was referred to ENT and had sputum cultured. Pt had heart bypass (08/2010).   PCP is Dr. Jeanice Lim  Past Medical History  Diagnosis Date  . Hypertension   . Hyperlipidemia   . GERD (gastroesophageal reflux disease)   . Cerebrovascular disease 2005    left parietal CVA  . Macular degeneration     + cataract; legally blind  . Arteriosclerotic cardiovascular disease (ASCVD) 1982    Acute MI in 1982; returned with non-ST segment elevation MI in 07/2010; emergency CABG-Dr. Laneta Simmers  . Chronic kidney disease, stage 2, mildly decreased GFR   . Vascular dementia   . Stroke     Past Surgical History  Procedure Laterality Date  . Heart bypass  08/2010    [facility]  . Tonsillectomy    . Hernia repair    . Coronary artery bypass graft    . Esophagogastroduodenoscopy  09/20/11    schatzki's ring/hiatal hernia/antal erosion. bx showed gastritis but no h.pylori  . Savory dilation  09/20/2011    Procedure: SAVORY DILATION;  Surgeon: Corbin Ade, MD;  Location: [facility]  END[facility];  Service: Endoscopy;  Laterality: N/A;  Elease Hashimoto dilation  09/20/2011    Procedure: Elease Hashimoto DILATION;  Surgeon: Corbin Ade, MD;  Location: [facility] ENDO SU[facility];  Service: Endoscopy;  Laterality: N/A;  . Cardiac surgery      No family history on file.  History  Substance Use Topics  . Smoking status: Never Smoker   . Smokeless tobacco: Not on file  . Alcohol Use: No      Review of Systems 10 Systems reviewed and all are negative for acute change except as noted in the HPI.   Allergies  Remeron  Home Medications   Current Outpatient Rx  Name  Route  Sig  Dispense  Refill  . aspirin 81 MG tablet   Oral   Take 81 mg by mouth daily.          Marland Kitchen atorvastatin (LIPITOR) 20 MG tablet   Oral   Take 1 tablet (20 mg total) by mouth daily.   30 tablet   5   . docusate sodium (COLACE) 100 MG capsule   Oral   Take 100 mg by mouth 2 (two) times daily.         . metoprolol tartrate (LOPRESSOR) 25 MG tablet      TAKE ONE HALF TABLET BY MOUTH EVERY DAY   45 tablet  1   . omeprazole (PRILOSEC) 20 MG capsule   Oral   Take 1 capsule (20 mg total) by mouth daily.   30 capsule   5   . oxybutynin (DITROPAN) 5 MG tablet      TAKE 1 TABLET (5 MG TOTAL) BY MOUTH 2 (TWO) TIMES DAILY.   60 tablet   3   . ranitidine (ZANTAC) 150 MG tablet   Oral   Take 150 mg by mouth 2 (two) times daily.           BP 139/87  Pulse 82  Temp(Src) 98.5 F (36.9 C)  Resp 18  Ht 5\' 11"  (1.803 m)  Wt 185 lb (83.915 kg)  BMI 25.81 kg/m2  SpO2 95%  Physical Exam  Nursing note and vitals reviewed. Constitutional: He is oriented to person, place, and time. He appears well-developed and well-nourished.  HENT:  Head: Normocephalic and atraumatic.  Eyes: Conjunctivae and EOM are normal. Pupils are equal, round, and reactive to light.  Neck: Normal range of motion. Neck supple.  Cardiovascular: Normal rate, regular rhythm and normal heart sounds.   Pulmonary/Chest: Effort normal  and breath sounds normal.  Abdominal: Soft. Bowel sounds are normal.  Musculoskeletal: Normal range of motion.  Neurological: He is alert and oriented to person, place, and time.  Skin: Skin is warm and dry.  Psychiatric: He has a normal mood and affect.    ED Course  Procedures (including critical care time) DIAGNOSTIC STUDIES: Oxygen Saturation is 95% on room air, adequate by my interpretation.    COORDINATION OF CARE: 7:32 AM Discussed ED treatment with pt and pt agrees to chest xray, prednisone, albuterol inhaler and referral to pulmonologist.      Dg Chest 2 View  08/21/2012   *RADIOLOGY REPORT*  Clinical Data: Productive cough for 1 month, history stroke, hypertension, hyperlipidemia  CHEST - 2 VIEW  Comparison: 09/28/2011  Findings: Normal heart size post CABG. Calcified tortuous aorta. Pulmonary vascularity normal. Lungs appear emphysematous but clear. No pleural effusion or pneumothorax. No acute osseous findings.  IMPRESSION: Post CABG. COPD. No acute abnormalities   Original Report Authenticated By: Ulyses Southward, M.D.     No diagnosis found.   Date: 08/21/2012  Rate: 74  Rhythm: normal sinus rhythm  QRS Axis: normal  Intervals: normal  ST/T Wave abnormalities: normal  Conduction Disutrbances: none  Narrative Interpretation: unremarkable     MDM  Patient in no acute distress. No obvious respiratory problems.  Normal vital signs.  Discharge meds albuterol inhaler, prednisone, Claritin. Referral to pulmonologist       I personally performed the services described in this documentation, which was scribed in my presence. The recorded information has been reviewed and is accurate.    Donnetta Hutching, MD 08/21/12 [id number]

## 2012-08-21 NOTE — ED Notes (Signed)
Pt presents to er with c/o chronic cough, sob that has become worse over the past two days, cough is productive with clear sputum, admits to mid center chest pain that has been intermittent and relieved with tums and zantac. Denies any pain at present, denies any problems with swelling, sob does not change with laying down or exertion.

## 2012-08-21 NOTE — ED Notes (Signed)
Pt c/o constant productive cough with clear sputum worsening x 2 days. Denies pain/sob. nad noted.

## 2012-08-28 ENCOUNTER — Ambulatory Visit: Payer: MEDICARE | Admitting: Family Medicine

## 2012-09-21 ENCOUNTER — Institutional Professional Consult (permissible substitution): Payer: MEDICARE | Admitting: Internal Medicine

## 2012-09-22 ENCOUNTER — Encounter (HOSPITAL_COMMUNITY): Payer: Self-pay | Admitting: *Deleted

## 2012-09-22 ENCOUNTER — Emergency Department (HOSPITAL_COMMUNITY)
Admission: EM | Admit: 2012-09-22 | Discharge: 2012-09-22 | Disposition: A | Discharge status: Home / Self Care | Payer: MEDICARE | Attending: Emergency Medicine | Admitting: Emergency Medicine

## 2012-09-22 ENCOUNTER — Emergency Department (HOSPITAL_COMMUNITY): Payer: MEDICARE

## 2012-09-22 DIAGNOSIS — I252 Old myocardial infarction: Secondary | ICD-10-CM | POA: Insufficient documentation

## 2012-09-22 DIAGNOSIS — E785 Hyperlipidemia, unspecified: Secondary | ICD-10-CM | POA: Insufficient documentation

## 2012-09-22 DIAGNOSIS — R059 Cough, unspecified: Secondary | ICD-10-CM | POA: Insufficient documentation

## 2012-09-22 DIAGNOSIS — R05 Cough: Secondary | ICD-10-CM

## 2012-09-22 DIAGNOSIS — Z8659 Personal history of other mental and behavioral disorders: Secondary | ICD-10-CM | POA: Insufficient documentation

## 2012-09-22 DIAGNOSIS — Z8669 Personal history of other diseases of the nervous system and sense organs: Secondary | ICD-10-CM | POA: Insufficient documentation

## 2012-09-22 DIAGNOSIS — Z8679 Personal history of other diseases of the circulatory system: Secondary | ICD-10-CM | POA: Insufficient documentation

## 2012-09-22 DIAGNOSIS — R0602 Shortness of breath: Secondary | ICD-10-CM | POA: Insufficient documentation

## 2012-09-22 DIAGNOSIS — K219 Gastro-esophageal reflux disease without esophagitis: Secondary | ICD-10-CM | POA: Insufficient documentation

## 2012-09-22 DIAGNOSIS — Z7982 Long term (current) use of aspirin: Secondary | ICD-10-CM | POA: Insufficient documentation

## 2012-09-22 DIAGNOSIS — Z8673 Personal history of transient ischemic attack (TIA), and cerebral infarction without residual deficits: Secondary | ICD-10-CM | POA: Insufficient documentation

## 2012-09-22 DIAGNOSIS — Z79899 Other long term (current) drug therapy: Secondary | ICD-10-CM | POA: Insufficient documentation

## 2012-09-22 DIAGNOSIS — R040 Epistaxis: Secondary | ICD-10-CM | POA: Insufficient documentation

## 2012-09-22 DIAGNOSIS — I129 Hypertensive chronic kidney disease with stage 1 through stage 4 chronic kidney disease, or unspecified chronic kidney disease: Secondary | ICD-10-CM | POA: Insufficient documentation

## 2012-09-22 DIAGNOSIS — Z951 Presence of aortocoronary bypass graft: Secondary | ICD-10-CM | POA: Insufficient documentation

## 2012-09-22 DIAGNOSIS — N182 Chronic kidney disease, stage 2 (mild): Secondary | ICD-10-CM | POA: Insufficient documentation

## 2012-09-22 DIAGNOSIS — R053 Chronic cough: Secondary | ICD-10-CM

## 2012-09-22 NOTE — ED Notes (Signed)
Pt c/o nosebleed that began around 4:30 this afternoon. Pt presents to ED with BP of 184/98, pt states "I normally have low blood pressure". Pt denies headache, lightheadedness, chest pain. Pt does reports productive cough which also began this afternoon. Bleeding appears to be controlled at this time.

## 2012-09-22 NOTE — ED Notes (Addendum)
Pt c/o nosebleed x 30 min pta. Pt is convinced blood is coming from his chest

## 2012-09-22 NOTE — ED Provider Notes (Signed)
History  This chart was scribed for Alex Razor, MD, by Candelaria Stagers, ED Scribe. This patient was seen in room APA12/APA12 and the patient's care was started at 9:19 PM  CSN: [id number] Arrival date & time 09/22/12  1952  First MD Initiated Contact with Patient 09/22/12 2107     Chief Complaint  Patient presents with  . Epistaxis   The history is provided by the patient and a relative. No language interpreter was used.   HPI Comments: LANCELOT ALYEA is a 79 y.o. male who presents to the Emergency Department complaining of epistaxis, mostly from the left nare, that started about five hours ago.  Pt denies injury or trauma.  He has no h/o nosebleeds.  Pt does not take a blood thinner.  He reports he has been experiencing a productive cough over the last few weeks and began coughing up blood today around the same time the nosebleed started.  He also reports SOB over the last week.  Pt has an appointment with a lung specialist in one week.  Nothing seems to make the sx better or worse.    Past Medical History  Diagnosis Date  . Hypertension   . Hyperlipidemia   . GERD (gastroesophageal reflux disease)   . Cerebrovascular disease 2005    left parietal CVA  . Macular degeneration     + cataract; legally blind  . Arteriosclerotic cardiovascular disease (ASCVD) 1982    Acute MI in 1982; returned with non-ST segment elevation MI in 07/2010; emergency CABG-Dr. Laneta Simmers  . Chronic kidney disease, stage 2, mildly decreased GFR   . Vascular dementia   . Stroke    Past Surgical History  Procedure Laterality Date  . Heart bypass  08/2010    [facility]  . Tonsillectomy    . Hernia repair    . Coronary artery bypass graft    . Esophagogastroduodenoscopy  09/20/11    schatzki's ring/hiatal hernia/antal erosion. bx showed gastritis but no h.pylori  . Savory dilation  09/20/2011    Procedure: SAVORY DILATION;  Surgeon: Corbin Ade, MD;  Location: [facility];  Service: Endoscopy;   Laterality: N/A;  Elease Hashimoto dilation  09/20/2011    Procedure: Elease Hashimoto DILATION;  Surgeon: Corbin Ade, MD;  Location: [facility];  Service: Endoscopy;  Laterality: N/A;  . Cardiac surgery     History reviewed. No pertinent family history. History  Substance Use Topics  . Smoking status: Never Smoker   . Smokeless tobacco: Not on file  . Alcohol Use: No    Review of Systems  HENT: Positive for nosebleeds.   Respiratory: Positive for cough.   All other systems reviewed and are negative.    Allergies  Remeron  Home Medications   Current Outpatient Rx  Name  Route  Sig  Dispense  Refill  . aspirin EC 81 MG tablet   Oral   Take 81 mg by mouth daily.         Marland Kitchen atorvastatin (LIPITOR) 20 MG tablet   Oral   Take 1 tablet (20 mg total) by mouth daily.   30 tablet   5   . calcium carbonate (TUMS - DOSED IN MG ELEMENTAL CALCIUM) 500 MG chewable tablet   Oral   Chew 1 tablet by mouth 3 (three) times daily as needed for heartburn.         . diphenhydrAMINE (BENADRYL) 50 MG tablet   Oral   Take 50 mg by mouth at bedtime  as needed for sleep.         Marland Kitchen docusate sodium (COLACE) 100 MG capsule   Oral   Take 100 mg by mouth 2 (two) times daily.         . metoprolol tartrate (LOPRESSOR) 25 MG tablet   Oral   Take 12.5 mg by mouth daily.         Marland Kitchen oxybutynin (DITROPAN) 5 MG tablet   Oral   Take 5 mg by mouth 2 (two) times daily.         Marland Kitchen albuterol (PROVENTIL HFA;VENTOLIN HFA) 108 (90 BASE) MCG/ACT inhaler   Inhalation   Inhale 2 puffs into the lungs every 4 (four) hours as needed for wheezing.   1 Inhaler   1   . ranitidine (ZANTAC) 150 MG tablet   Oral   Take 150 mg by mouth daily as needed for heartburn.           BP 171/94  Pulse 68  Temp(Src) 98.6 F (37 C)  Resp 20  Ht 6' (1.829 m)  Wt 179 lb (81.194 kg)  BMI 24.27 kg/m2  SpO2 96% Physical Exam  Nursing note and vitals reviewed. Constitutional: He is oriented to person, place, and  time. He appears well-developed and well-nourished. No distress.  HENT:  Head: Normocephalic and atraumatic.  Dried blood in left nostril.  No discreet source of blood identified.  Right nostril cleared.  Dark blood noted on tongue and lips.  Posterior pharynx clear.    Eyes: Conjunctivae and EOM are normal.  Conjunctiva not pale   Neck: Neck supple. No tracheal deviation present.  Cardiovascular: Normal rate.   Pulmonary/Chest: Effort normal. No respiratory distress.  Musculoskeletal: Normal range of motion.  Neurological: He is alert and oriented to person, place, and time.  Skin: Skin is warm and dry.  Psychiatric: He has a normal mood and affect. His behavior is normal.    ED Course  Procedures   DIAGNOSTIC STUDIES: Oxygen Saturation is 96% on room air, normal by my interpretation.    COORDINATION OF CARE:  9:22 PM Discussed course of care with pt which includes chest xray.  Pt understands and agrees.   Labs Reviewed - No data to display Dg Chest 2 View  09/22/2012   *RADIOLOGY REPORT*  Clinical Data: Epistaxis today.  CHEST - 2 VIEW  Comparison: 08/21/2012  Findings: Changes from CABG surgery are stable.  The cardiac silhouette is normal in size and configuration.  The aorta is tortuous and mildly uncoiled.  No mediastinal or hilar masses.  The lungs are clear.  No pleural effusion or pneumothorax.  The bony thorax is demineralized but intact.  IMPRESSION: No acute cardiopulmonary disease.  No change from the prior study.   Original Report Authenticated By: Amie Portland, M.D.   1. Epistaxis   2. Chronic cough     MDM  79 year old male with chronic cough. No respiratory distress. Chest x-ray clear. Consider etiology such as postnasal drip or reflux. Patient is in no acute distress. Epistaxis spontaneously resolved. No ED intervention. Return precautions discussed outpatient followup otherwise.   I personally preformed the services scribed in my presence. The recorded  information has been reviewed is accurate. Alex Razor, MD.    Alex Razor, MD 09/27/12 2215

## 2012-09-25 ENCOUNTER — Encounter: Payer: Self-pay | Admitting: Family Medicine

## 2012-09-25 ENCOUNTER — Ambulatory Visit (INDEPENDENT_AMBULATORY_CARE_PROVIDER_SITE_OTHER): Payer: MEDICARE | Admitting: Family Medicine

## 2012-09-25 VITALS — BP 128/84 | HR 70 | Temp 98.7°F | Resp 18 | Wt 182.0 lb

## 2012-09-25 DIAGNOSIS — R059 Cough, unspecified: Secondary | ICD-10-CM

## 2012-09-25 DIAGNOSIS — R05 Cough: Secondary | ICD-10-CM

## 2012-09-25 DIAGNOSIS — I10 Essential (primary) hypertension: Secondary | ICD-10-CM

## 2012-09-25 DIAGNOSIS — K5900 Constipation, unspecified: Secondary | ICD-10-CM

## 2012-09-25 DIAGNOSIS — R093 Abnormal sputum: Secondary | ICD-10-CM

## 2012-09-25 MED ORDER — POLYETHYLENE GLYCOL 3350 17 GM/SCOOP PO POWD
17.0000 g | Freq: Every day | ORAL | Status: DC
Start: 2012-09-25 — End: 2012-10-23

## 2012-09-25 MED ORDER — CARVEDILOL 3.125 MG PO TABS: 3.1250 mg | ORAL_TABLET | Freq: Two times a day (BID) | ORAL | Status: DC

## 2012-09-25 NOTE — Progress Notes (Signed)
  Subjective:    Patient ID: Alex Williamson, male    DOB: 1927-09-14, 79 y.o.   MRN: [id number]  HPI  Patient here to follow chronic medical problems and ER followup. He was seen in the ER 7/19 secondary to epistaxis he also complained of a chronic cough chest x-ray was done which was negative. He was told to see a pulmonologist there for appointment as scheduled. He did restart taking Mucinex for some of the mucus and this does help some He complains of his blood pressure medication he was taking a half a tablet metoprolol once a day which was doing well for his blood pressure however he sits it feels like he was getting stuck because of the jagged edges. He started started his blood pressure medicine every other day subsequently his blood pressure was 170-180 systolic in the emergency room. He would like to have a different blood pressure medicine.  Is also inquiring about another medication for his constipation the stool softeners are not helping.  Review of Systems  GEN- denies fatigue, fever, weight loss,weakness, recent illness HEENT- denies eye drainage, change in vision, nasal discharge, CVS- denies chest pain, palpitations RESP- denies SOB, cough, wheeze ABD- denies N/V, +change in stools, abd pain GU- denies dysuria, hematuria, dribbling, incontinence MSK- denies joint pain, muscle aches, injury Neuro- denies headache, dizziness, syncope, seizure activity      Objective:   Physical Exam  GEN- NAD, alert and oriented x3 HEENT- PERRL, EOMI, non injected sclera, pink conjunctiva, MMM, oropharynx clear Neck- Supple, no LAD CVS- RRR, no murmur RESP-CTAB ABD-NABS,soft,NT,ND EXT- No edema Pulses- Radial, DP- 2+        Assessment & Plan:

## 2012-09-25 NOTE — Patient Instructions (Addendum)
Miralax prescribed for the constipation Start the coreg twice a day  We will call to see what blood pressure is running Stop metoprolol F/U 4  months

## 2012-09-26 NOTE — Assessment & Plan Note (Signed)
Patient has history of chronic cough. He's been evaluated multiple times for this. He has underlying dementia as well and does not seem to remember all the different interventions we have had. He's not on an ACE inhibitor or an ARB. He's been evaluated by GI  he had esophageal dilation this did not help the symptoms. He is also been on PPI and H2 blocker with no change .He's also been seen by ear nose and throat with evaluation and different treatments for the sputum and daily and mucolytic which have not helped. He was also given trial of albuterol this did not help. He has CT of chest and chest x-ray which were negative. He has an appointment with pulmonary I welcome any recommendations to help his symptoms appear

## 2012-09-26 NOTE — Assessment & Plan Note (Signed)
Is having difficulty taking a half tablet. I will switch her from metoprolol to carvedilol low-dose twice a day, his daughter is able to take his blood pressure at home we will followup via phone in one week

## 2012-09-26 NOTE — Assessment & Plan Note (Signed)
Per above multiple interventions no improvement

## 2012-09-26 NOTE — Assessment & Plan Note (Signed)
Add miralax to regimen

## 2012-10-01 ENCOUNTER — Encounter: Payer: Self-pay | Admitting: Internal Medicine

## 2012-10-01 ENCOUNTER — Ambulatory Visit (INDEPENDENT_AMBULATORY_CARE_PROVIDER_SITE_OTHER): Payer: MEDICARE | Admitting: Internal Medicine

## 2012-10-01 VITALS — BP 119/68 | HR 64 | Temp 97.7°F | Ht 71.0 in | Wt 181.2 lb

## 2012-10-01 DIAGNOSIS — R1310 Dysphagia, unspecified: Secondary | ICD-10-CM

## 2012-10-01 DIAGNOSIS — R05 Cough: Secondary | ICD-10-CM

## 2012-10-01 MED ORDER — RANITIDINE HCL 150 MG PO TABS: 150.0000 mg | ORAL_TABLET | Freq: Every day | ORAL | Status: DC

## 2012-10-01 MED ORDER — PREDNISONE (PAK) 10 MG PO TABS: ORAL_TABLET | ORAL | Status: DC

## 2012-10-01 MED ORDER — DEXLANSOPRAZOLE 60 MG PO CPDR: 60.0000 mg | DELAYED_RELEASE_CAPSULE | Freq: Every day | ORAL | Status: DC

## 2012-10-01 NOTE — Progress Notes (Signed)
  Subjective:    Patient ID: Alex Williamson, male    DOB: 02-25-1928  MRN: [id number]  HPI  40 yowm never smoker no trouble as child or adult doing yardwork then moved [place]  around 2009 and daughter noticed persistent need for throat clearing and am cough with neg ent eval refrred to pulmonary clinic 10/01/12 for cough.    10/01/2012 1st pulmonary eval daily cc chronic cough x at least 5 years much worse over last 5 years worse around 3 am and then again in am with min production white mucus ropy assoc with sob just when coughing - "nothing ever helps" but not clear on what nothing is and daughter now in charge of meds and not really sure what he's taken either but primary notes has been on gerd and asthma rx (not sure he ever took it) and had GI eval with dilation (Rourk) and denies choking on food but still has dysphagia.   Does have assoc severe HB rx with tums.    No obvious pattern to daytime variabilty or assoc  cp or chest tightness, subjective wheeze overt sinus   symptoms. No unusual exp hx or h/o childhood pna/ asthma or knowledge of premature birth.     Also denies any obvious fluctuation of symptoms with weather or environmental changes or other aggravating or alleviating factors except as outlined above   Review of Systems  Constitutional: Negative for fever, chills, activity change, appetite change and unexpected weight change.  HENT: Positive for trouble swallowing. Negative for congestion, sore throat, rhinorrhea, sneezing, dental problem, voice change and postnasal drip.   Eyes: Negative for visual disturbance.  Respiratory: Positive for cough and shortness of breath. Negative for choking.   Cardiovascular: Negative for chest pain and leg swelling.  Gastrointestinal: Negative for nausea, vomiting and abdominal pain.  Genitourinary: Negative for difficulty urinating.  Musculoskeletal: Negative for arthralgias.  Skin: Negative for rash.  Psychiatric/Behavioral: Negative  for behavioral problems and confusion.       Objective:   Physical Exam   Hoarse amb wm  Wt Readings from Last 3 Encounters:  10/01/12 181 lb 3.2 oz (82.192 kg)  09/25/12 182 lb (82.555 kg)  09/22/12 179 lb (81.194 kg)     HEENT: nl dentition, turbinates, and orophanx. Nl external ear canals without cough reflex   NECK :  without JVD/Nodes/TM/ nl carotid upstrokes bilaterally   LUNGS: no acc muscle use, clear to A and P bilaterally without cough on insp or exp maneuvers   CV:  RRR  no s3 or murmur or increase in P2, no edema   ABD:  soft and nontender with nl excursion in the supine position. No bruits or organomegaly, bowel sounds nl  MS:  warm without deformities, calf tenderness, cyanosis or clubbing  SKIN: warm and dry without lesions    NEURO:  alert, approp, no deficits    Ct chest 10/06/11 1. No active cardiopulmonary abnormalities.  2. No mass or adenopathy noted.  CXR  09/22/12 No acute cardiopulmonary disease. No change from the prior study.      Assessment & Plan:

## 2012-10-01 NOTE — Patient Instructions (Addendum)
For cough take deslym 2 tsp every 12 hours as needed  Stop albuterol, tums, mucinex and benadryl  Dexilant 60 mg Take 30-60 min before first meal of the day   Zantac 150 mg one at bedtime   Chlortrimeton 4 mg at bedtime   GERD (REFLUX)  is an extremely common cause of respiratory symptoms, many times with no significant heartburn at all.    It can be treated with medication, but also with lifestyle changes including avoidance of late meals, excessive alcohol, smoking cessation, and avoid fatty foods, chocolate, peppermint, colas, red wine, and acidic juices such as orange juice.  NO MINT OR MENTHOL PRODUCTS SO NO COUGH DROPS  USE SUGARLESS CANDY INSTEAD (jolley ranchers or Stover's)  NO OIL BASED VITAMINS - use powdered substitutes.    See Tammy NP w/in 2 weeks with all your medications, even over the counter meds, separated in two separate bags, the ones you take no matter(those are the ones in the pillbox )  what vs the ones you stop once you feel better and take only as needed when you feel you need them.   Tammy  will generate for you a new user friendly medication calendar that will put [place] all on the same page re: your medication use.     Without this process, it simply isn't possible to assure that we are providing  your outpatient care  with  the attention to detail we feel you deserve.   If we cannot assure that you're getting that kind of care,  then we cannot manage your problem effectively from this clinic.  Once you have seen Tammy and we are sure that we're all on the same page with your medication use she will arrange follow up with me.

## 2012-10-02 ENCOUNTER — Telehealth: Payer: Self-pay | Admitting: Family Medicine

## 2012-10-02 NOTE — Telephone Encounter (Signed)
Blood pressure looks good, continue the coreg

## 2012-10-02 NOTE — Assessment & Plan Note (Signed)
The most common causes of chronic cough in immunocompetent adults include the following: upper airway cough syndrome (UACS), previously referred to as postnasal drip syndrome (PNDS), which is caused by variety of rhinosinus conditions; (2) asthma; (3) GERD; (4) chronic bronchitis from cigarette smoking or other inhaled environmental irritants; (5) nonasthmatic eosinophilic bronchitis; and (6) bronchiectasis.   These conditions, singly or in combination, have accounted for up to 94% of the causes of chronic cough in prospective studies.   Other conditions have constituted no >6% of the causes in prospective studies These have included bronchogenic carcinoma, chronic interstitial pneumonia, sarcoidosis, left ventricular failure, ACEI-induced cough, and aspiration from a condition associated with pharyngeal dysfunction.    Chronic cough is often simultaneously caused by more than one condition. A single cause has been found from 38 to 82% of the time, multiple causes from 18 to 62%. Multiply caused cough has been the result of three diseases up to 42% of the time.       This is clearly likely to be a form of  Classic Upper airway cough syndrome, so named because it's frequently impossible to sort out how much is  CR/sinusitis with freq throat clearing (which can be related to primary GERD)   vs  causing  secondary (" extra esophageal")  GERD from wide swings in gastric pressure that occur with throat clearing, often  promoting self use of mint and menthol lozenges that reduce the lower esophageal sphincter tone and exacerbate the problem further in a cyclical fashion.   These are the same pts (now being labeled as having "irritable larynx syndrome" by some cough centers) who not infrequently have a history of having failed to tolerate ace inhibitors,  dry powder inhalers or biphosphonates or report having atypical reflux symptoms that don't respond to standard doses of PPI , and are easily confused as  having aecopd or asthma flares by even experienced allergists/ pulmonologists.  Extended discussed with pt and daughter:  The standardized cough guidelines published in Chest by [place] in 2006 are still the best available and consist of a multiple step process (up to 12!) , not a single office visit,  and are intended  to address this problem logically,  with an alogrithm dependent on response to empiric treatment at  each progressive step  to determine a specific diagnosis with  minimal addtional testing needed. Therefore if adherence is an issue or can't be accurately verified,  it's very unlikely the standard evaluation and treatment will be successful here.    Furthermore, response to therapy (other than acute cough suppression, which should only be used short term with avoidance of narcotic containing cough syrups if possible), can be a gradual process for which the patient may perceive immediate benefit.  Unlike going to an eye doctor where the best perscription is almost always the first one and is immediately effective, this is almost never the case in the management of chronic cough syndromes. Therefore the patient needs to commit up front to consistently adhere to recommendations  for up to 6 weeks of therapy directed at the likely underlying problem(s) before the response can be reasonably evaluated.   For now try max gerd rx with ppi and h2 hs plus H1 first gen then bring him back for a trust but verify ov before going any further  See instructions for specific recommendations which were reviewed directly with the patient who was given a copy with highlighter outlining the key components.

## 2012-10-02 NOTE — Telephone Encounter (Signed)
09/25/12 119/82 °09/26/12 109/82 °09/27/12 119/81 °09/28/12 120/81 °09/29/12 126/93 °09/30/12 119/82 °10/01/12 119/82 °

## 2012-10-02 NOTE — Telephone Encounter (Signed)
Pt aware.

## 2012-10-02 NOTE — Assessment & Plan Note (Addendum)
-   7/2013sp egd with dilation of shatzke's ring with documented HH also  Needs max maint rx for now

## 2012-10-03 ENCOUNTER — Telehealth: Payer: Self-pay | Admitting: Family Medicine

## 2012-10-03 MED ORDER — RANITIDINE HCL 150 MG PO TABS: 150.0000 mg | ORAL_TABLET | Freq: Every day | ORAL | Status: DC

## 2012-10-03 NOTE — Telephone Encounter (Signed)
Med refill

## 2012-10-12 ENCOUNTER — Telehealth: Payer: Self-pay | Admitting: Internal Medicine

## 2012-10-12 MED ORDER — DEXLANSOPRAZOLE 60 MG PO CPDR: 60.0000 mg | DELAYED_RELEASE_CAPSULE | Freq: Every day | ORAL | Status: DC

## 2012-10-12 NOTE — Telephone Encounter (Signed)
Spoke to pt's daughter. States that they can't come get samples because they live in [place]. Would like a 14 day supply sent to the pharmacy. This has been taken care of.

## 2012-10-19 ENCOUNTER — Encounter: Payer: Self-pay | Admitting: Adult Health

## 2012-10-19 ENCOUNTER — Ambulatory Visit (INDEPENDENT_AMBULATORY_CARE_PROVIDER_SITE_OTHER): Payer: MEDICARE | Admitting: Adult Health

## 2012-10-19 VITALS — BP 124/80 | HR 89 | Temp 98.2°F | Ht 71.0 in | Wt 181.2 lb

## 2012-10-19 DIAGNOSIS — R05 Cough: Secondary | ICD-10-CM

## 2012-10-19 MED ORDER — OMEPRAZOLE 20 MG PO CPDR: 20.0000 mg | DELAYED_RELEASE_CAPSULE | Freq: Every day | ORAL | Status: DC

## 2012-10-19 NOTE — Patient Instructions (Addendum)
Follow med calendar closely and bring to each visit. Begin Zyrtec 10 mg in the morning. Continue on Chlor-Trimeton 4 mg 2 tablets at bedtime. May replace Dexilant With Omeprazole 20mg  daily  follow up Dr. Sherene Sires  In 4 weeks with PFT  Please contact office for sooner follow up if symptoms do not improve or worsen or seek emergency care

## 2012-10-19 NOTE — Progress Notes (Signed)
  Subjective:    Patient ID: Alex Williamson, male    DOB: April 21, 1927  MRN: [id number]  HPI 27 yowm never smoker no trouble as child or adult doing yardwork then moved [place]  around 2009 and daughter noticed persistent need for throat clearing and am cough with neg ent eval refrred to pulmonary clinic 10/01/12 for cough.    10/01/2012 1st pulmonary eval daily cc chronic cough x at least 5 years much worse over last 5 years worse around 3 am and then again in am with min production white mucus ropy assoc with sob just when coughing - "nothing ever helps" but not clear on what nothing is and daughter now in charge of meds and not really sure what he's taken either but primary notes has been on gerd and asthma rx (not sure he ever took it) and had GI eval with dilation (Rourk) and denies choking on food but still has dysphagia.   Does have assoc severe HB rx with tums.   >>For cough take deslym 2 tsp every 12 hours as needed,  Stop albuterol, tums, mucinex and benadryl, Dexilant 60 mg Take 30-60 min before first meal of the day  Zantac 150 mg one at bedtime Chlortrimeton 4 mg at bedtime   10/19/2012 Follow up and med review  Returns for follow up and med review  His medications. Patient medication calendar with patient education. Patient was seen 2 weeks ago in the office. Started on a reflux, prevention regimen with Exelon, and Zantac. Patient reports he cannot afford Dexilant  . Also continues to have drainage and tickle in his throat with dry cough.   He denies any orthopnea, PND, leg swelling, hemoptysis, or fever   Review of Systems  Constitutional: Negative for fever, chills, activity change, appetite change and unexpected weight change.  HENT:  . Negative for congestion, sore throat, rhinorrhea, sneezing, dental problem, voice change and postnasal drip.   Eyes: Negative for visual disturbance.  Respiratory: Positive for cough and shortness of breath. Negative for choking.    Cardiovascular: Negative for chest pain and leg swelling.  Gastrointestinal: Negative for nausea, vomiting and abdominal pain.  Genitourinary: Negative for difficulty urinating.  Musculoskeletal: Negative for arthralgias.  Skin: Negative for rash.  Psychiatric/Behavioral: Negative for behavioral problems and confusion.       Objective:   Physical Exam   Hoarse amb wm    HEENT: nl dentition, turbinates, and orophanx. Nl external ear canals without cough reflex   NECK :  without JVD/Nodes/TM/ nl carotid upstrokes bilaterally   LUNGS: no acc muscle use, clear to A and P bilaterally without cough on insp or exp maneuvers   CV:  RRR  no s3 or murmur or increase in P2, no edema   ABD:  soft and nontender with nl excursion in the supine position. No bruits or organomegaly, bowel sounds nl  MS:  warm without deformities, calf tenderness, cyanosis or clubbing  SKIN: warm and dry without lesions    NEURO:  alert, approp, no deficits    Ct chest 10/06/11 1. No active cardiopulmonary abnormalities.  2. No mass or adenopathy noted.  CXR  09/22/12 No acute cardiopulmonary disease. No change from the prior study.      Assessment & Plan:

## 2012-10-21 NOTE — Assessment & Plan Note (Signed)
Cough with ?AR and GERD triggers Patient's medications were reviewed today and patient education was given. Computerized medication calendar was adjusted/completed  Plan  Follow med calendar closely and bring to each visit. Begin Zyrtec 10 mg in the morning. Continue on Chlor-Trimeton 4 mg 2 tablets at bedtime. May replace Dexilant With Omeprazole 20mg  daily  follow up Dr. Sherene Sires  In 4 weeks with PFT  Please contact office for sooner follow up if symptoms do not improve or worsen or seek emergency care

## 2012-10-23 NOTE — Addendum Note (Signed)
Addended by: Boone Master E on: 10/23/2012 01:04 PM   Modules accepted: Orders

## 2012-11-30 ENCOUNTER — Ambulatory Visit: Payer: MEDICARE | Admitting: Internal Medicine

## 2012-12-14 ENCOUNTER — Telehealth: Payer: Self-pay | Admitting: Family Medicine

## 2012-12-14 MED ORDER — OXYBUTYNIN CHLORIDE 5 MG PO TABS: 5.0000 mg | ORAL_TABLET | Freq: Two times a day (BID) | ORAL | Status: DC

## 2012-12-14 MED ORDER — RANITIDINE HCL 150 MG PO TABS: 150.0000 mg | ORAL_TABLET | Freq: Every day | ORAL | Status: DC

## 2012-12-14 NOTE — Telephone Encounter (Signed)
Oxybutynin 5mg   BID   And   Ranitidine 150 mg  HS

## 2012-12-19 ENCOUNTER — Ambulatory Visit (INDEPENDENT_AMBULATORY_CARE_PROVIDER_SITE_OTHER): Payer: MEDICARE | Admitting: Internal Medicine

## 2012-12-19 ENCOUNTER — Telehealth: Payer: Self-pay | Admitting: Internal Medicine

## 2012-12-19 ENCOUNTER — Encounter: Payer: Self-pay | Admitting: Internal Medicine

## 2012-12-19 VITALS — BP 120/78 | HR 58 | Temp 98.0°F | Ht 69.0 in | Wt 183.0 lb

## 2012-12-19 DIAGNOSIS — R05 Cough: Secondary | ICD-10-CM

## 2012-12-19 MED ORDER — PREDNISONE (PAK) 10 MG PO TABS: ORAL_TABLET | ORAL | Status: DC

## 2012-12-19 MED ORDER — NEBIVOLOL HCL 5 MG PO TABS: 5.0000 mg | ORAL_TABLET | Freq: Every day | ORAL | Status: DC

## 2012-12-19 NOTE — Progress Notes (Signed)
Subjective:    Patient ID: Alex Williamson, male    DOB: 06/01/1927  MRN: [id number]    Brief patient profile:  25 yowm never smoker no trouble as child or adult doing yardwork then moved [place]  around 2009 and daughter noticed persistent need for throat clearing and am cough with neg ent eval referred to pulmonary clinic 10/01/12 for cough.     History of Present Illness  10/01/2012 1st pulmonary eval daily cc chronic cough x at least 5 years but  much worse over last 5 years esp around 3 am and then again p wakes up with min production white mucus ropy assoc with sob just when coughing - "nothing ever helps" but not clear on what nothing is and daughter now in charge of meds and not really sure what he's taken either but primary notes has been on gerd and asthma rx (not sure he ever took it) and had GI eval with dilation (Rourk) and denies choking on food but still has dysphagia.  Does have assoc severe HB rx with tums.   >>For cough take deslym 2 tsp every 12 hours as needed,  Stop albuterol, tums, mucinex and benadryl, Dexilant 60 mg Take 30-60 min before first meal of the day  Zantac 150 mg one at bedtime Chlortrimeton 4 mg at bedtime   10/19/2012 Follow up and med review  Returns for follow up and med review  His medications. Patient medication calendar with patient education. Patient was seen 2 weeks ago in the office. Started on a reflux, prevention regimen with Exelon, and Zantac. Patient reports he cannot afford Dexilant   Also continues to have drainage and tickle in his throat with dry cough.    rec Follow med calendar closely and bring to each visit. Begin Zyrtec 10 mg in the morning. Continue on Chlor-Trimeton 4 mg 2 tablets at bedtime. May replace Dexilant With Omeprazole 20mg  daily    12/19/2012 f/u ov/Alex Williamson re: chronic cough - did not bring calendar Chief Complaint  Patient presents with  . Follow-up    PFT done today.  Cough unchanged since last OV.  cough is  every night wakes him up 2-3 times prematurely and did not respond to combination of 2 chlortrimeton and zantac with oyster like quality white mucus.  Swallowing in better but still using tums. Not clear he or daughter are following the instructions or using the med calendar as intended.  No obvious day to day or daytime variabilty or assoc sob orcp or chest tightness, subjective wheeze overt sinus or hb symptoms. No unusual exp hx or h/o childhood pna/ asthma or knowledge of premature birth.   Also denies any obvious fluctuation of symptoms with weather or environmental changes or other aggravating or alleviating factors except as outlined above   Current Medications, Allergies, Complete Past Medical History, Past Surgical History, Family History, and Social History were reviewed in Alex Williamson record.  ROS  The following are not active complaints unless bolded sore throat, dysphagia, dental problems, itching, sneezing,  nasal congestion or excess/ purulent secretions, ear ache,   fever, chills, sweats, unintended wt loss, pleuritic or exertional cp, hemoptysis,  orthopnea pnd or leg swelling, presyncope, palpitations, heartburn, abdominal pain, anorexia, nausea, vomiting, diarrhea  or change in bowel or urinary habits, change in stools or urine, dysuria,hematuria,  rash, arthralgias, visual complaints, headache, numbness weakness or ataxia or problems with walking or coordination,  change in mood/affect or memory.  Objective:   Physical Exam   Hoarse amb wm  nad  Wt Readings from Last 3 Encounters:  12/19/12 183 lb (83.008 kg)  10/19/12 181 lb 3.2 oz (82.192 kg)  10/01/12 181 lb 3.2 oz (82.192 kg)      HEENT: nl dentition, turbinates, and orophanx. Nl external ear canals without cough reflex   NECK :  without JVD/Nodes/TM/ nl carotid upstrokes bilaterally   LUNGS: no acc muscle use, clear to A and P bilaterally without cough on insp or exp  maneuvers   CV:  RRR  no s3 or murmur or increase in P2, no edema   ABD:  soft and nontender with nl excursion in the supine position. No bruits or organomegaly, bowel sounds nl  MS:  warm without deformities, calf tenderness, cyanosis or clubbing  SKIN: warm and dry without lesions    NEURO:  alert, approp, no deficits    Ct chest 10/06/11 1. No active cardiopulmonary abnormalities.  2. No mass or adenopathy noted.  CXR  09/22/12 No acute cardiopulmonary disease. No change from the prior study.      Assessment & Plan:

## 2012-12-19 NOTE — Telephone Encounter (Signed)
Error.  No message needed.  Holly D Pryor ° °

## 2012-12-19 NOTE — Progress Notes (Signed)
PFT done today. 

## 2012-12-19 NOTE — Patient Instructions (Addendum)
Stop carvedol  Zyrtec, chlortrimeton  Start bystolic 5 mg daily  mucinex dm 600  2 every 12 hours  = 1200 mg every 12 hours Take aleve with meals as needed for joint pain  Take 2 ranitidine at bedtime   GERD (REFLUX)  is an extremely common cause of respiratory symptoms, many times with no significant heartburn at all.    It can be treated with medication, but also with lifestyle changes including avoidance of late meals, excessive alcohol, smoking cessation, and avoid fatty foods, chocolate, peppermint, colas, red wine, and acidic juices such as orange juice.  NO MINT OR MENTHOL PRODUCTS SO NO COUGH DROPS  USE SUGARLESS CANDY INSTEAD (jolley ranchers or Stover's or life savers) NO OIL BASED VITAMINS - use powdered substitutes.  Please schedule a follow up office visit in 2- 4 weeks, sooner if needed with pillboxes and med calendar and all medications in hand  Late add : consider trial of neurontin next p assure 100% med reconciliaton.

## 2012-12-20 ENCOUNTER — Telehealth: Payer: Self-pay | Admitting: Family Medicine

## 2012-12-20 MED ORDER — OMEPRAZOLE 20 MG PO CPDR: 20.0000 mg | DELAYED_RELEASE_CAPSULE | Freq: Every day | ORAL | Status: DC

## 2012-12-20 NOTE — Telephone Encounter (Signed)
Needs refill on Omeprazole 20 mgs called in to [facility] because [facility] is closed

## 2012-12-20 NOTE — Telephone Encounter (Signed)
Med refilled to [facility] pharmacy per pt request

## 2012-12-20 NOTE — Assessment & Plan Note (Signed)
Still strongly support dx of  Classic Upper airway cough syndrome, so named because it's frequently impossible to sort out how much is  CR/sinusitis with freq throat clearing (which can be related to primary GERD)   vs  causing  secondary (" extra esophageal")  GERD from wide swings in gastric pressure that occur with throat clearing, often  promoting self use of mint and menthol lozenges that reduce the lower esophageal sphincter tone and exacerbate the problem further in a cyclical fashion.   These are the same pts (now being labeled as having "irritable larynx syndrome" by some cough centers) who not infrequently have a history of having failed to tolerate ace inhibitors,  dry powder inhalers or biphosphonates or report having atypical reflux symptoms that don't respond to standard doses of PPI , and are easily confused as having aecopd or asthma flares by even experienced allergists/ pulmonologists.  Since no better on max gerd and antihistamine regimen will shift gears using a reverse therapeutic approach to see what different if any eliminating the antihistamines completely makes as his complaint really is one of excess thick mucus production at hs and just max rx with mucinex.  At same time, his pft's do show enough variability to suggest an asthmatic component so Strongly prefer in this setting: Bystolic, the most beta -1  selective Beta blocker available in sample form, with bisoprolol the most selective generic choice  on the market.

## 2013-01-16 ENCOUNTER — Ambulatory Visit: Payer: MEDICARE | Admitting: Internal Medicine

## 2013-01-21 ENCOUNTER — Telehealth: Payer: Self-pay | Admitting: Internal Medicine

## 2013-01-21 MED ORDER — NEBIVOLOL HCL 5 MG PO TABS: 5.0000 mg | ORAL_TABLET | Freq: Every day | ORAL | Status: DC

## 2013-01-21 NOTE — Telephone Encounter (Signed)
Pt spouse aware rx has been sent. Nothing further needed

## 2013-01-22 ENCOUNTER — Other Ambulatory Visit: Payer: Self-pay | Admitting: Family Medicine

## 2013-04-08 ENCOUNTER — Other Ambulatory Visit: Payer: Self-pay | Admitting: Family Medicine

## 2013-05-13 ENCOUNTER — Telehealth: Payer: Self-pay | Admitting: Family Medicine

## 2013-05-13 MED ORDER — CARVEDILOL 3.125 MG PO TABS: ORAL_TABLET | ORAL | Status: DC

## 2013-05-13 MED ORDER — OXYBUTYNIN CHLORIDE 5 MG PO TABS: 5.0000 mg | ORAL_TABLET | Freq: Two times a day (BID) | ORAL | Status: DC

## 2013-05-13 MED ORDER — OMEPRAZOLE 20 MG PO CPDR: 20.0000 mg | DELAYED_RELEASE_CAPSULE | Freq: Every day | ORAL | Status: DC

## 2013-05-13 MED ORDER — RANITIDINE HCL 150 MG PO TABS: ORAL_TABLET | ORAL | Status: DC

## 2013-05-13 NOTE — Telephone Encounter (Signed)
Call back number is [phone] Pharmacy is [facility] Pt daughter is calling ranitidine (ZANTAC) 150 MG tablet carvedilol (COREG) 3.125 MG tablet omeprazole (PRILOSEC) 20 MG capsule oxybutynin (DITROPAN) 5 MG tablet --please call the daughter if you can refill these, he has an apt next Monday

## 2013-05-13 NOTE — Telephone Encounter (Signed)
Refill appropriate and filled per protocol. Patient daughter made aware.

## 2013-05-20 ENCOUNTER — Ambulatory Visit (INDEPENDENT_AMBULATORY_CARE_PROVIDER_SITE_OTHER): Payer: MEDICARE | Admitting: Family Medicine

## 2013-05-20 ENCOUNTER — Encounter: Payer: Self-pay | Admitting: Family Medicine

## 2013-05-20 VITALS — BP 138/80 | HR 76 | Temp 98.5°F | Resp 18 | Ht 69.5 in | Wt 179.0 lb

## 2013-05-20 DIAGNOSIS — S60459A Superficial foreign body of unspecified finger, initial encounter: Secondary | ICD-10-CM

## 2013-05-20 DIAGNOSIS — F0150 Vascular dementia without behavioral disturbance: Secondary | ICD-10-CM

## 2013-05-20 DIAGNOSIS — I7090 Unspecified atherosclerosis: Secondary | ICD-10-CM

## 2013-05-20 DIAGNOSIS — R32 Unspecified urinary incontinence: Secondary | ICD-10-CM

## 2013-05-20 DIAGNOSIS — I2510 Atherosclerotic heart disease of native coronary artery without angina pectoris: Secondary | ICD-10-CM

## 2013-05-20 DIAGNOSIS — Z23 Encounter for immunization: Secondary | ICD-10-CM

## 2013-05-20 DIAGNOSIS — E785 Hyperlipidemia, unspecified: Secondary | ICD-10-CM

## 2013-05-20 DIAGNOSIS — N182 Chronic kidney disease, stage 2 (mild): Secondary | ICD-10-CM

## 2013-05-20 DIAGNOSIS — I10 Essential (primary) hypertension: Secondary | ICD-10-CM

## 2013-05-20 DIAGNOSIS — I679 Cerebrovascular disease, unspecified: Secondary | ICD-10-CM

## 2013-05-20 LAB — CBC WITH DIFFERENTIAL/PLATELET
Basophils Absolute: 0 10*3/uL (ref 0.0–0.1)
Basophils Relative: 0 % (ref 0–1)
Eosinophils Absolute: 0.2 10*3/uL (ref 0.0–0.7)
Eosinophils Relative: 2 % (ref 0–5)
HEMATOCRIT: 42 % (ref 39.0–52.0)
HEMOGLOBIN: 15 g/dL (ref 13.0–17.0)
LYMPHS PCT: 33 % (ref 12–46)
Lymphs Abs: 2.5 10*3/uL (ref 0.7–4.0)
MCH: 31.3 pg (ref 26.0–34.0)
MCHC: 35.7 g/dL (ref 30.0–36.0)
MCV: 87.7 fL (ref 78.0–100.0)
MONO ABS: 0.8 10*3/uL (ref 0.1–1.0)
MONOS PCT: 11 % (ref 3–12)
NEUTROS ABS: 4.1 10*3/uL (ref 1.7–7.7)
Neutrophils Relative %: 54 % (ref 43–77)
Platelets: 189 10*3/uL (ref 150–400)
RBC: 4.79 MIL/uL (ref 4.22–5.81)
RDW: 13.8 % (ref 11.5–15.5)
WBC: 7.6 10*3/uL (ref 4.0–10.5)

## 2013-05-20 LAB — LIPID PANEL
CHOLESTEROL: 258 mg/dL — AB (ref 0–200)
HDL: 48 mg/dL (ref >39)
LDL Cholesterol: 169 mg/dL — ABNORMAL HIGH (ref 0–99)
Total CHOL/HDL Ratio: 5.4 Ratio
Triglycerides: 207 mg/dL — ABNORMAL HIGH (ref <150)
VLDL: 41 mg/dL — AB (ref 0–40)

## 2013-05-20 LAB — COMPLETE METABOLIC PANEL WITH GFR
ALBUMIN: 4 g/dL (ref 3.5–5.2)
ALT: 11 U/L (ref 0–53)
AST: 14 U/L (ref 0–37)
Alkaline Phosphatase: 56 U/L (ref 39–117)
BUN: 21 mg/dL (ref 6–23)
CALCIUM: 9.8 mg/dL (ref 8.4–10.5)
CHLORIDE: 103 meq/L (ref 96–112)
CO2: 26 meq/L (ref 19–32)
Creat: 1.29 mg/dL (ref 0.50–1.35)
GFR, EST NON AFRICAN AMERICAN: 51 mL/min — AB
GFR, Est African American: 58 mL/min — ABNORMAL LOW
GLUCOSE: 80 mg/dL (ref 70–99)
POTASSIUM: 5 meq/L (ref 3.5–5.3)
Sodium: 138 mEq/L (ref 135–145)
Total Bilirubin: 0.6 mg/dL (ref 0.2–1.2)
Total Protein: 6.7 g/dL (ref 6.0–8.3)

## 2013-05-20 MED ORDER — POLYETHYLENE GLYCOL 3350 17 GM/SCOOP PO POWD: 17.0000 g | Freq: Every day | ORAL | Status: DC

## 2013-05-20 NOTE — Assessment & Plan Note (Signed)
Function at baseline, continue to control risk factors

## 2013-05-20 NOTE — Assessment & Plan Note (Signed)
No cardiac issues recently, check non fasting labs, on statin and BB

## 2013-05-20 NOTE — Assessment & Plan Note (Signed)
Blood pressure well controlled

## 2013-05-20 NOTE — Assessment & Plan Note (Signed)
Continue ditropan improves QOL which is more important than risks at this time and to family

## 2013-05-20 NOTE — Assessment & Plan Note (Signed)
No significant deterioration since our last visit

## 2013-05-20 NOTE — Progress Notes (Signed)
Patient ID: Alex Williamson, male   DOB: Aug 17, 1927, 80 y.o.   MRN: [id number]     Subjective:    Patient ID: Alex Williamson, male    DOB: 27-Jul-1927, 80 y.o.   MRN: [id number]  Patient presents for Medication Review  Pt here to f/u chronic medical problems, has not bee seen since July of 2014, at that time he was sent to pulmonary for persistant sputum production, he was previously seen by GI and ENT for same problem. He sputum has improved some, he continues to take PPI in am and H2 blocker in PM, there were medication changes by pulmonary but he could not afford the changes and otherwise did not see any improvement.  Today he does complain of glass in his left hand, states the other day he broke a glass in the cupbard and a sliver went into his left index finger, he did try to remove it, still feels like there is something in it    Review Of Systems:  GEN- denies fatigue, fever, weight loss,weakness, recent illness HEENT- denies eye drainage, change in vision, nasal discharge, CVS- denies chest pain, palpitations RESP- denies SOB, cough, wheeze ABD- denies N/V, change in stools, abd pain GU- denies dysuria, hematuria, dribbling, incontinence MSK- denies joint pain, muscle aches, injury Neuro- denies headache, dizziness, syncope, seizure activity       Objective:    BP 138/80  Pulse 76  Temp(Src) 98.5 F (36.9 C)  Resp 18  Ht 5' 9.5" (1.765 m)  Wt 179 lb (81.194 kg)  BMI 26.06 kg/m2 GEN- NAD, alert and oriented x3 HEENT- PERRL, EOMI, non injected sclera, pink conjunctiva, MMM, oropharynx clear Neck- Supple, no LAD CVS- RRR, no murmur RESP-CTAB ABD-NABS,soft,NT,ND Skin- Left index finger- foreign body palpated medial edge, no erythema , no discharge  EXT- No edema Pulses- Radial, DP- 2+  Procedure- foreign body removal Procedure explained to patient questions answered benefits and risks discussed verbal consent obtained. Antiseptic-Betadine Anesthesia-lidocaine Small  sliver of black foreign body removed, no glass seen Minimal blood loss Patient tolerated procedure well Bandage applied with triple anti-biotic        Assessment & Plan:      Problem List Items Addressed This Visit   Vascular dementia     No significant deterioration since our last visit    Urinary incontinence     Continue ditropan improves QOL which is more important than risks at this time and to family    HYPERTENSION     Blood pressure well controlled    Relevant Orders      CBC with Differential (Completed)      COMPLETE METABOLIC PANEL WITH GFR (Completed)   HYPERLIPIDEMIA     Check FLP on red yeast rice    Relevant Orders      Lipid panel (Completed)   Chronic kidney disease, stage 2, mildly decreased GFR - Primary     Check renal function    Cerebrovascular disease     Function at baseline, continue to control risk factors    Arteriosclerotic cardiovascular disease (ASCVD)     No cardiac issues recently, check non fasting labs, on statin and BB     Other Visit Diagnoses   Need for prophylactic vaccination against Streptococcus pneumoniae (pneumococcus)        Relevant Orders       Pneumococcal conjugate vaccine 13-valent (Completed)    Need for prophylactic vaccination with combined diphtheria-tetanus-pertussis (DTP) vaccine  Relevant Orders       Tdap vaccine greater than or equal to 7yo IM (Completed)    Foreign body of finger        Dark piece of fibrous material removed at bedtime, flush for glass, TDAP given       Note: This dictation was prepared with Dragon dictation along with smaller phrase technology. Any transcriptional errors that result from this process are unintentional.

## 2013-05-20 NOTE — Assessment & Plan Note (Addendum)
Check renal function. 

## 2013-05-20 NOTE — Assessment & Plan Note (Signed)
Check FLP on red yeast rice

## 2013-05-20 NOTE — Patient Instructions (Signed)
Continue current medications Tetanus given, pneumonia prevnar 13 given F/U 4 months

## 2013-05-21 ENCOUNTER — Other Ambulatory Visit: Payer: Self-pay | Admitting: *Deleted

## 2013-05-21 MED ORDER — ATORVASTATIN CALCIUM 20 MG PO TABS: 20.0000 mg | ORAL_TABLET | Freq: Every day | ORAL | Status: DC

## 2013-05-21 NOTE — Telephone Encounter (Signed)
Per orders noted on labs, medication sent to pharmacy.   Call placed to patient and patient made aware. 

## 2013-07-11 ENCOUNTER — Other Ambulatory Visit: Payer: Self-pay | Admitting: Family Medicine

## 2013-07-11 DIAGNOSIS — I639 Cerebral infarction, unspecified: Secondary | ICD-10-CM

## 2013-07-15 ENCOUNTER — Ambulatory Visit (HOSPITAL_COMMUNITY)
Admission: RE | Admit: 2013-07-15 | Discharge: 2013-07-15 | Disposition: A | Discharge status: Home / Self Care | Payer: MEDICARE | Source: Ambulatory Visit | Attending: Family Medicine | Admitting: Family Medicine

## 2013-07-15 DIAGNOSIS — Z8673 Personal history of transient ischemic attack (TIA), and cerebral infarction without residual deficits: Secondary | ICD-10-CM | POA: Insufficient documentation

## 2013-07-15 DIAGNOSIS — R42 Dizziness and giddiness: Secondary | ICD-10-CM | POA: Insufficient documentation

## 2013-07-15 DIAGNOSIS — I7090 Unspecified atherosclerosis: Secondary | ICD-10-CM | POA: Insufficient documentation

## 2013-07-15 DIAGNOSIS — R030 Elevated blood-pressure reading, without diagnosis of hypertension: Secondary | ICD-10-CM | POA: Insufficient documentation

## 2013-07-15 DIAGNOSIS — I639 Cerebral infarction, unspecified: Secondary | ICD-10-CM

## 2013-07-15 DIAGNOSIS — E785 Hyperlipidemia, unspecified: Secondary | ICD-10-CM | POA: Insufficient documentation

## 2013-07-15 DIAGNOSIS — H538 Other visual disturbances: Secondary | ICD-10-CM | POA: Insufficient documentation

## 2013-07-15 DIAGNOSIS — G319 Degenerative disease of nervous system, unspecified: Secondary | ICD-10-CM | POA: Insufficient documentation

## 2013-07-16 ENCOUNTER — Telehealth: Payer: Self-pay | Admitting: *Deleted

## 2013-07-16 NOTE — Telephone Encounter (Signed)
Md states that CT of head shows old stroke, no new stroke areas.   LMTRC.

## 2013-07-16 NOTE — Telephone Encounter (Signed)
Message copied by Phillips OdorSIX, CHRISTINA H on Tue Jul 16, 2013  9:28 AM ------      Message from: Harriet MassonOBERTS, CHELSEA N      Created: Tue Jul 16, 2013  9:19 AM      Contact: [phone]       Please contact pt once we have brain scan back ------

## 2013-07-16 NOTE — Telephone Encounter (Signed)
[facility]   Patient daughter made aware.

## 2013-08-08 ENCOUNTER — Telehealth: Payer: Self-pay | Admitting: *Deleted

## 2013-08-08 DIAGNOSIS — F0390 Unspecified dementia without behavioral disturbance: Secondary | ICD-10-CM

## 2013-08-08 DIAGNOSIS — F32A Depression, unspecified: Secondary | ICD-10-CM

## 2013-08-08 DIAGNOSIS — F329 Major depressive disorder, single episode, unspecified: Secondary | ICD-10-CM

## 2013-08-08 NOTE — Telephone Encounter (Signed)
Daughter called stating she is needing a referral to [facility] for her father for LEGAL REASON, he was there 2 yrs ago. Ok to do referral?

## 2013-08-08 NOTE — Telephone Encounter (Signed)
Message copied by Samuella Cota on Thu Aug 08, 2013  2:44 PM ------      Message from: Harriet Masson      Created: Thu Aug 08, 2013 11:54 AM      Regarding: Referral       Contact: [phone]       PT daughter Monia Pouch is calling because her father is due to have a psychological exam and she is wanting a referral to behavior health center in [place], the one he went to last time  ------

## 2013-08-09 NOTE — Telephone Encounter (Signed)
Referral placed for psychology ?

## 2013-08-09 NOTE — Telephone Encounter (Signed)
Send psychology referral- Dr. Kieth Brightly, at [facility] in [place]  Dx- Depression, Dementia

## 2013-08-16 ENCOUNTER — Ambulatory Visit (HOSPITAL_COMMUNITY): Payer: MEDICARE | Admitting: Psychology

## 2013-09-16 ENCOUNTER — Ambulatory Visit (HOSPITAL_COMMUNITY): Payer: MEDICARE | Admitting: Psychology

## 2013-09-24 ENCOUNTER — Other Ambulatory Visit: Payer: Self-pay | Admitting: Family Medicine

## 2013-09-24 ENCOUNTER — Encounter: Payer: Self-pay | Admitting: Family Medicine

## 2013-09-24 NOTE — Telephone Encounter (Signed)
Medication refill for one time only.  Patient needs to be seen.  Letter sent for patient to call and schedule 

## 2013-10-07 ENCOUNTER — Other Ambulatory Visit: Payer: Self-pay | Admitting: *Deleted

## 2013-10-07 DIAGNOSIS — Z0000 Encounter for general adult medical examination without abnormal findings: Secondary | ICD-10-CM

## 2013-10-10 ENCOUNTER — Ambulatory Visit (INDEPENDENT_AMBULATORY_CARE_PROVIDER_SITE_OTHER): Payer: MEDICARE | Admitting: Psychology

## 2013-10-10 DIAGNOSIS — F09 Unspecified mental disorder due to known physiological condition: Secondary | ICD-10-CM

## 2013-10-10 DIAGNOSIS — F0150 Vascular dementia without behavioral disturbance: Secondary | ICD-10-CM

## 2013-10-14 ENCOUNTER — Ambulatory Visit: Payer: Self-pay | Admitting: Family Medicine

## 2013-10-16 ENCOUNTER — Encounter: Payer: Self-pay | Admitting: Family Medicine

## 2013-10-16 ENCOUNTER — Ambulatory Visit (INDEPENDENT_AMBULATORY_CARE_PROVIDER_SITE_OTHER): Payer: MEDICARE | Admitting: Family Medicine

## 2013-10-16 VITALS — BP 118/76 | HR 64 | Temp 98.0°F | Resp 14 | Ht 66.0 in | Wt 176.0 lb

## 2013-10-16 DIAGNOSIS — Z0000 Encounter for general adult medical examination without abnormal findings: Secondary | ICD-10-CM

## 2013-10-16 DIAGNOSIS — R059 Cough, unspecified: Secondary | ICD-10-CM

## 2013-10-16 DIAGNOSIS — E785 Hyperlipidemia, unspecified: Secondary | ICD-10-CM

## 2013-10-16 DIAGNOSIS — R05 Cough: Secondary | ICD-10-CM

## 2013-10-16 DIAGNOSIS — I10 Essential (primary) hypertension: Secondary | ICD-10-CM

## 2013-10-16 DIAGNOSIS — I2510 Atherosclerotic heart disease of native coronary artery without angina pectoris: Secondary | ICD-10-CM

## 2013-10-16 DIAGNOSIS — I7090 Unspecified atherosclerosis: Secondary | ICD-10-CM

## 2013-10-16 DIAGNOSIS — K5901 Slow transit constipation: Secondary | ICD-10-CM

## 2013-10-16 LAB — CBC WITH DIFFERENTIAL/PLATELET
Basophils Absolute: 0 10*3/uL (ref 0.0–0.1)
Basophils Relative: 0 % (ref 0–1)
Eosinophils Absolute: 0.1 10*3/uL (ref 0.0–0.7)
Eosinophils Relative: 2 % (ref 0–5)
HEMATOCRIT: 39.3 % (ref 39.0–52.0)
Hemoglobin: 13.9 g/dL (ref 13.0–17.0)
LYMPHS PCT: 34 % (ref 12–46)
Lymphs Abs: 2.4 10*3/uL (ref 0.7–4.0)
MCH: 31.1 pg (ref 26.0–34.0)
MCHC: 35.4 g/dL (ref 30.0–36.0)
MCV: 87.9 fL (ref 78.0–100.0)
MONO ABS: 0.7 10*3/uL (ref 0.1–1.0)
Monocytes Relative: 10 % (ref 3–12)
NEUTROS PCT: 54 % (ref 43–77)
Neutro Abs: 3.9 10*3/uL (ref 1.7–7.7)
Platelets: 171 10*3/uL (ref 150–400)
RBC: 4.47 MIL/uL (ref 4.22–5.81)
RDW: 13.7 % (ref 11.5–15.5)
WBC: 7.2 10*3/uL (ref 4.0–10.5)

## 2013-10-16 LAB — COMPLETE METABOLIC PANEL WITH GFR
ALBUMIN: 3.9 g/dL (ref 3.5–5.2)
ALT: 12 U/L (ref 0–53)
AST: 13 U/L (ref 0–37)
Alkaline Phosphatase: 54 U/L (ref 39–117)
BUN: 20 mg/dL (ref 6–23)
CO2: 26 meq/L (ref 19–32)
Calcium: 9.7 mg/dL (ref 8.4–10.5)
Chloride: 106 mEq/L (ref 96–112)
Creat: 1.48 mg/dL — ABNORMAL HIGH (ref 0.50–1.35)
GFR, EST AFRICAN AMERICAN: 50 mL/min — AB
GFR, Est Non African American: 43 mL/min — ABNORMAL LOW
GLUCOSE: 81 mg/dL (ref 70–99)
POTASSIUM: 5.2 meq/L (ref 3.5–5.3)
SODIUM: 140 meq/L (ref 135–145)
TOTAL PROTEIN: 6.4 g/dL (ref 6.0–8.3)
Total Bilirubin: 0.5 mg/dL (ref 0.2–1.2)

## 2013-10-16 LAB — LIPID PANEL
CHOL/HDL RATIO: 3.7 ratio
Cholesterol: 164 mg/dL (ref 0–200)
HDL: 44 mg/dL (ref >39)
LDL Cholesterol: 89 mg/dL (ref 0–99)
Triglycerides: 155 mg/dL — ABNORMAL HIGH (ref <150)
VLDL: 31 mg/dL (ref 0–40)

## 2013-10-16 MED ORDER — LINACLOTIDE 145 MCG PO CAPS: 145.0000 ug | ORAL_CAPSULE | Freq: Every day | ORAL | Status: DC

## 2013-10-16 NOTE — Progress Notes (Signed)
Patient ID: Alex Williamson, male   DOB: 06/20/1927, 80 y.o.   MRN: [id number]   Subjective:    Patient ID: Alex Stackharles R Williamson, male    DOB: 10/29/1927, 80 y.o.   MRN: [id number]  Patient presents for 4 month F/U, Constipation and Sore throat  Patient here to follow chronic medical problems. He continues to have the congestion excessive saliva he's been worked up multiple times by gastroenterology myself ear nose and throat and pulmonary. He is actually not taking his Zantac at bedtime along with his PPI in the morning.  He states that the MiraLAX is not helping his bowels move as well he would like to try something different.  He still being cared for by his daughter. Medications were reviewed   Review Of Systems:  GEN- denies fatigue, fever, weight loss,weakness, recent illness HEENT- denies eye drainage, change in vision, nasal discharge, CVS- denies chest pain, palpitations RESP- denies SOB, cough, wheeze ABD- denies N/V, change in stools, abd pain GU- denies dysuria, hematuria, dribbling, incontinence MSK- denies joint pain, muscle aches, injury Neuro- denies headache, dizziness, syncope, seizure activity       Objective:    BP 118/76  Pulse 64  Temp(Src) 98 F (36.7 C) (Oral)  Resp 14  Ht 5\' 6"  (1.676 m)  Wt 176 lb (79.833 kg)  BMI 28.42 kg/m2 GEN- NAD, alert and oriented x3, alert and oriented to person, place HEENT- PERRL, EOMI, non injected sclera, pink conjunctiva, MMM, oropharynx clear Neck- Supple, no LAD CVS- RRR, no murmur RESP-CTAB ABD-NABS,soft,NT,ND EXT- No edema Pulses- Radial, DP- 2+        Assessment & Plan:      Problem List Items Addressed This Visit   HYPERTENSION   HYPERLIPIDEMIA - Primary    Other Visit Diagnoses   Routine general medical examination at a health care facility           Note: This dictation was prepared with Dragon dictation along with smaller phrase technology. Any transcriptional errors that result from this process  are unintentional.

## 2013-10-16 NOTE — Patient Instructions (Signed)
Try the linzess  Use the biotin dry mouth medicine Restart the zantac at bedtinme F/U 3 months

## 2013-10-16 NOTE — Assessment & Plan Note (Signed)
Blood pressure well controlled

## 2013-10-16 NOTE — Assessment & Plan Note (Signed)
I will give him a trial of Linzess 145mg  once a day, hold miralax He does have anti-cholinergic bladder pill  But family wants to keep this

## 2013-10-16 NOTE — Assessment & Plan Note (Signed)
Chronic cough with abnormal sputum production. He will restart the Zantac at bedtime

## 2013-10-21 ENCOUNTER — Other Ambulatory Visit: Payer: Self-pay | Admitting: Family Medicine

## 2013-10-21 ENCOUNTER — Telehealth: Payer: Self-pay | Admitting: Family Medicine

## 2013-10-21 MED ORDER — LINACLOTIDE 145 MCG PO CAPS: 145.0000 ug | ORAL_CAPSULE | Freq: Every day | ORAL | Status: DC

## 2013-10-21 NOTE — Telephone Encounter (Signed)
[phone] or [phone] [facility]  We gave patient samples of linzess and it is working well would like rx for this sent to pharmacy

## 2013-10-21 NOTE — Telephone Encounter (Signed)
Refill appropriate and filled per protocol. 

## 2013-10-23 ENCOUNTER — Other Ambulatory Visit: Payer: Self-pay | Admitting: *Deleted

## 2013-10-23 ENCOUNTER — Telehealth: Payer: Self-pay | Admitting: *Deleted

## 2013-10-23 MED ORDER — LINACLOTIDE 145 MCG PO CAPS: 145.0000 ug | ORAL_CAPSULE | Freq: Every day | ORAL | Status: DC

## 2013-10-23 NOTE — Telephone Encounter (Signed)
Received fax from pharmacy requesting PA for Linzess.   PA submitted.   Noted that insurance requires trial and failure of Lubiprostone (Amitiza).

## 2013-10-23 NOTE — Telephone Encounter (Signed)
Received call from patient daughter reporting that Linzess was not sent to pharmacy.   Noted that medication was on "No Print".   Medication correctly sent to pharmacy.

## 2013-10-24 NOTE — Telephone Encounter (Signed)
Switch to amitiza once a day for constipation, let family know

## 2013-10-24 NOTE — Telephone Encounter (Signed)
Received PA determination.   PA denied.  

## 2013-10-25 MED ORDER — LUBIPROSTONE 24 MCG PO CAPS: 24.0000 ug | ORAL_CAPSULE | Freq: Every day | ORAL | Status: DC

## 2013-10-25 NOTE — Telephone Encounter (Signed)
Prescription sent to pharmacy.   Call placed to patient and patient daughter, [name] made aware.

## 2013-10-31 ENCOUNTER — Ambulatory Visit (HOSPITAL_COMMUNITY): Payer: MEDICARE | Admitting: Psychology

## 2013-11-15 ENCOUNTER — Encounter (HOSPITAL_COMMUNITY): Payer: Self-pay | Admitting: Psychology

## 2013-11-15 NOTE — Progress Notes (Signed)
Today I administred the CERAD neuropsychological battery.  Results will be provided on feedback session.

## 2014-02-20 ENCOUNTER — Encounter: Payer: Self-pay | Admitting: Family Medicine

## 2014-02-20 ENCOUNTER — Other Ambulatory Visit: Payer: Self-pay | Admitting: Family Medicine

## 2014-02-20 NOTE — Telephone Encounter (Signed)
Medication refill for one time only.  Patient needs to be seen.  Letter sent for patient to call and schedule 

## 2014-03-03 ENCOUNTER — Telehealth: Payer: Self-pay | Admitting: Family Medicine

## 2014-03-03 MED ORDER — CARVEDILOL 3.125 MG PO TABS: ORAL_TABLET | ORAL | Status: DC

## 2014-03-03 NOTE — Telephone Encounter (Signed)
Patient is calling to get refill on bp medication [phone]

## 2014-03-03 NOTE — Telephone Encounter (Signed)
Medication filled x1 with no refills.   Requires office visit before any further refills can be given.   Letter sent.  

## 2014-03-05 ENCOUNTER — Ambulatory Visit: Payer: BC Managed Care – PPO | Admitting: Family Medicine

## 2014-03-10 ENCOUNTER — Ambulatory Visit (INDEPENDENT_AMBULATORY_CARE_PROVIDER_SITE_OTHER): Payer: MEDICARE | Admitting: Family Medicine

## 2014-03-10 ENCOUNTER — Encounter: Payer: Self-pay | Admitting: Family Medicine

## 2014-03-10 VITALS — BP 130/72 | HR 68 | Temp 98.8°F | Resp 16 | Ht 66.0 in | Wt 175.0 lb

## 2014-03-10 DIAGNOSIS — N182 Chronic kidney disease, stage 2 (mild): Secondary | ICD-10-CM

## 2014-03-10 DIAGNOSIS — N3941 Urge incontinence: Secondary | ICD-10-CM

## 2014-03-10 DIAGNOSIS — I10 Essential (primary) hypertension: Secondary | ICD-10-CM

## 2014-03-10 DIAGNOSIS — F0150 Vascular dementia without behavioral disturbance: Secondary | ICD-10-CM

## 2014-03-10 DIAGNOSIS — K219 Gastro-esophageal reflux disease without esophagitis: Secondary | ICD-10-CM

## 2014-03-10 MED ORDER — CARVEDILOL 3.125 MG PO TABS: ORAL_TABLET | ORAL | Status: DC

## 2014-03-10 MED ORDER — OMEPRAZOLE 20 MG PO CPDR: 20.0000 mg | DELAYED_RELEASE_CAPSULE | Freq: Every day | ORAL | Status: DC

## 2014-03-10 MED ORDER — LUBIPROSTONE 24 MCG PO CAPS: 24.0000 ug | ORAL_CAPSULE | Freq: Every day | ORAL | Status: DC

## 2014-03-10 MED ORDER — OXYBUTYNIN CHLORIDE 5 MG PO TABS: ORAL_TABLET | ORAL | Status: DC

## 2014-03-10 MED ORDER — ATORVASTATIN CALCIUM 20 MG PO TABS: 20.0000 mg | ORAL_TABLET | Freq: Every day | ORAL | Status: DC

## 2014-03-10 MED ORDER — RANITIDINE HCL 150 MG PO TABS: ORAL_TABLET | ORAL | Status: DC

## 2014-03-10 NOTE — Assessment & Plan Note (Signed)
Overall he is doing fairly well and he has not had any behavioral problems regarding his dementia He did have recent evaluation by psychologist which was unchanged however this did not help his case regarding removal of the ankle bracelet which she's had since his felony many years ago

## 2014-03-10 NOTE — Assessment & Plan Note (Signed)
Continue omeprazole and Zantac

## 2014-03-10 NOTE — Assessment & Plan Note (Signed)
He continues to use due to her pain and they preferred this secondary to quality of life over treatment of the dementia

## 2014-03-10 NOTE — Progress Notes (Signed)
Patient ID: Alex Williamson, male   DOB: 1927/07/16, 81 y.o.   MRN: [id number]   Subjective:    Patient ID: Alex Williamson, male    DOB: 07-15-1927, 81 y.o.   MRN: [id number]  Patient presents for Medication Review & Refills  patient here to follow-up chronic medical process. There are no specific concerns today. He is due for all his medication refills. His immunizations are up-to-date. He is here today with his daughter who is his caregiver. He is getting around fairly well, legally blind, vascular dementia.    Review Of Systems:  GEN- denies fatigue, fever, weight loss,weakness, recent illness HEENT- denies eye drainage, change in vision, nasal discharge, CVS- denies chest pain, palpitations RESP- denies SOB, cough, wheeze ABD- denies N/V, change in stools, abd pain GU- denies dysuria, hematuria, dribbling, incontinence MSK- denies joint pain, muscle aches, injury Neuro- denies headache, dizziness, syncope, seizure activity       Objective:    BP 130/72 mmHg  Pulse 68  Temp(Src) 98.8 F (37.1 C) (Oral)  Resp 16  Ht  (1.676 m)  Wt 175 lb (79.379 kg)  BMI 28.26 kg/m2 GEN- NAD, alert and oriented x3 HEENT- PERRL, EOMI, non injected sclera, pink conjunctiva, MMM, oropharynx clear CVS- RRR, no murmur RESP-CTAB EXT- No edema Pulses- Radial2+        Assessment & Plan:      Problem List Items Addressed This Visit      Unprioritized   Vascular dementia - Primary   Essential hypertension      Note: This dictation was prepared with Dragon dictation along with smaller phrase technology. Any transcriptional errors that result from this process are unintentional.

## 2014-03-10 NOTE — Patient Instructions (Signed)
Continue current medication F/U 4 months- Morning appt

## 2014-03-10 NOTE — Assessment & Plan Note (Signed)
Blood pressure is well-controlled no change in medication 

## 2014-03-20 ENCOUNTER — Ambulatory Visit (INDEPENDENT_AMBULATORY_CARE_PROVIDER_SITE_OTHER): Payer: MEDICARE | Admitting: Otolaryngology

## 2014-03-20 DIAGNOSIS — H903 Sensorineural hearing loss, bilateral: Secondary | ICD-10-CM | POA: Diagnosis not present

## 2014-03-20 DIAGNOSIS — H6122 Impacted cerumen, left ear: Secondary | ICD-10-CM

## 2014-08-14 ENCOUNTER — Other Ambulatory Visit: Payer: Self-pay | Admitting: Family Medicine

## 2014-08-14 NOTE — Telephone Encounter (Signed)
Refill appropriate and filled per protocol. 

## 2014-08-20 ENCOUNTER — Encounter: Payer: Self-pay | Admitting: Family Medicine

## 2014-08-20 ENCOUNTER — Ambulatory Visit (INDEPENDENT_AMBULATORY_CARE_PROVIDER_SITE_OTHER): Payer: MEDICARE | Admitting: Family Medicine

## 2014-08-20 VITALS — BP 122/68 | HR 60 | Temp 98.1°F | Resp 14 | Ht 67.0 in | Wt 171.0 lb

## 2014-08-20 DIAGNOSIS — K5901 Slow transit constipation: Secondary | ICD-10-CM | POA: Diagnosis not present

## 2014-08-20 DIAGNOSIS — N182 Chronic kidney disease, stage 2 (mild): Secondary | ICD-10-CM | POA: Diagnosis not present

## 2014-08-20 DIAGNOSIS — I10 Essential (primary) hypertension: Secondary | ICD-10-CM | POA: Diagnosis not present

## 2014-08-20 DIAGNOSIS — E785 Hyperlipidemia, unspecified: Secondary | ICD-10-CM

## 2014-08-20 DIAGNOSIS — L600 Ingrowing nail: Secondary | ICD-10-CM

## 2014-08-20 DIAGNOSIS — F0150 Vascular dementia without behavioral disturbance: Secondary | ICD-10-CM

## 2014-08-20 DIAGNOSIS — Z0000 Encounter for general adult medical examination without abnormal findings: Secondary | ICD-10-CM | POA: Diagnosis not present

## 2014-08-20 DIAGNOSIS — N3941 Urge incontinence: Secondary | ICD-10-CM

## 2014-08-20 MED ORDER — LACTULOSE 20 GM/30ML PO SOLN: ORAL | Status: DC

## 2014-08-20 NOTE — Assessment & Plan Note (Signed)
Referral to podiatrist

## 2014-08-20 NOTE — Patient Instructions (Addendum)
Try the lactulose for your bowels Stop the miralax  Continue all other medications Get the labs done fasting in [place] Referral to podiatry F/u 6 months

## 2014-08-20 NOTE — Assessment & Plan Note (Signed)
Continue ditropan, he prefers quality of life and this medication helps

## 2014-08-20 NOTE — Assessment & Plan Note (Signed)
He is doing fairly well in his home. He is part of the leaf program

## 2014-08-20 NOTE — Assessment & Plan Note (Signed)
Blood pressure well controlled continue current medication 

## 2014-08-20 NOTE — Assessment & Plan Note (Signed)
He has tried multiple medications. He is unable to afford the Amitizause a and Miralaxis no longer working. He is tried over-the-counter Senokot he also tried Linzess, these did not help. I will try him on lactulose once a day

## 2014-08-20 NOTE — Progress Notes (Signed)
Patient ID: Alex Williamson, male   DOB: [birth year], 81 y.o.   MRN: [id number] Subjective:   Patient presents for Medicare Annual/Subsequent preventive examination.   Patient here for annual wellness exam. He is here with his daughter. He has history of vascular dementia. His main concern is his constipation. He has tried multiple medications and they're no longer helping. He cannot afford the Amitza though this does help his symptoms. He has been trying Ex-Lax recently. He was seen by ear nose and throat to follow-up his chronic cough he is maintained on his acid reflux medications he also had his ears cleaned out.  He has had bilateral ingrown toenails he will like to return to podiatry he now has one that is sore on the left foot. He did try shaving down his nails himself and caught some of the skin on some of the toes.  He is in the leaf program and is doing well. Review Past Medical/Family/Social: per EMR   Risk Factors  Current exercise habits: walks Dietary issues discussed: yes  Cardiac risk factors: HTN,CAD,CVA  Depression Screen  (Note: if answer to either of the following is "Yes", a more complete depression screening is indicated)  Over the past two weeks, have you felt down, depressed or hopeless? No Over the past two weeks, have you felt little interest or pleasure in doing things? No Have you lost interest or pleasure in daily life? No Do you often feel hopeless? No Do you cry easily over simple problems? No   Activities of Daily Living  In your present state of health, do you have any difficulty performing the following activities?:  Driving? No  Managing money? No  Feeding yourself? No  Getting from bed to chair? No  Climbing a flight of stairs? No  Preparing food and eating?: No  Bathing or showering? No  Getting dressed: No  Getting to the toilet? No  Using the toilet:No  Moving around from place to place: yes  In the past year have you fallen or had a near  fall?:No  Are you sexually active? No  Do you have more than one partner? No   Hearing Difficulties: Yes Do you often ask people to speak up or repeat themselves? yes Do you experience ringing or noises in your ears? yes Do you have difficulty understanding soft or whispered voices? yes  Do you feel that you have a problem with memory? YES- Do you often misplace items? No  Do you feel safe at home? Yes  Cognitive Testing  - Not done- Dementia   List the Names of Other Physician/Practitioners you currently use: ENT, podiatry   Screening Tests / Date - per EMR                     Zostavax - financial Influenza Vaccine  Tetanus/tdap- UTD  ROS: GEN- denies fatigue, fever, weight loss,weakness, recent illness HEENT- denies eye drainage, change in vision, nasal discharge, CVS- denies chest pain, palpitations RESP- denies SOB, cough, wheeze ABD- denies N/V, change in stools, abd pain GU- denies dysuria, hematuria, dribbling, incontinence MSK- denies joint pain, muscle aches, injury Neuro- denies headache, dizziness, syncope, seizure activity  PHYSICAL: GEN- NAD, alert and oriented x 2 HEENT- PERRL, EOMI, non injected sclera, pink conjunctiva, MMM, oropharynx clear Neck- Supple,  CVS- RRR soft systolic murmur RESP-CTAB ABD-NABS,soft,NT,ND Skin- ingrown bilat great toenails EXT- No edema Pulses- Radial, DP- 2+    Assessment:    Annual wellness medicare exam  Plan:    During the course of the visit the patient was educated and counseled about appropriate screening and preventive services including:      Well cared for by his daughter, in general doing well and has been stable.  No other changes to medications Diet review for nutrition referral? Yes ____ Not Indicated __x__  Patient Instructions (the written plan) was given to the patient.  Medicare Attestation  I have personally reviewed:  The patient's medical and social history  Their use of alcohol, tobacco or  illicit drugs  Their current medications and supplements  The patient's functional ability including ADLs,fall risks, home safety risks, cognitive, and hearing and visual impairment  Diet and physical activities  Evidence for depression or mood disorders  The patient's weight, height, BMI, and visual acuity have been recorded in the chart. I have made referrals, counseling, and provided education to the patient based on review of the above and I have provided the patient with a written personalized care plan for preventive services.

## 2014-09-01 LAB — COMPREHENSIVE METABOLIC PANEL
ALT: 11 U/L (ref 0–53)
AST: 14 U/L (ref 0–37)
Albumin: 3.7 g/dL (ref 3.5–5.2)
Alkaline Phosphatase: 55 U/L (ref 39–117)
BUN: 29 mg/dL — ABNORMAL HIGH (ref 6–23)
CALCIUM: 9.7 mg/dL (ref 8.4–10.5)
CO2: 25 mEq/L (ref 19–32)
CREATININE: 1.79 mg/dL — AB (ref 0.50–1.35)
Chloride: 110 mEq/L (ref 96–112)
Glucose, Bld: 92 mg/dL (ref 70–99)
Potassium: 5 mEq/L (ref 3.5–5.3)
Sodium: 142 mEq/L (ref 135–145)
TOTAL PROTEIN: 6.5 g/dL (ref 6.0–8.3)
Total Bilirubin: 0.6 mg/dL (ref 0.2–1.2)

## 2014-09-01 LAB — LIPID PANEL
CHOL/HDL RATIO: 3.3 ratio
Cholesterol: 171 mg/dL (ref 0–200)
HDL: 52 mg/dL (ref >=40)
LDL CALC: 99 mg/dL (ref 0–99)
Triglycerides: 101 mg/dL (ref <150)
VLDL: 20 mg/dL (ref 0–40)

## 2014-09-02 LAB — CBC WITH DIFFERENTIAL/PLATELET
Basophils Absolute: 0 10*3/uL (ref 0.0–0.1)
Basophils Relative: 0 % (ref 0–1)
Eosinophils Absolute: 0.1 10*3/uL (ref 0.0–0.7)
Eosinophils Relative: 2 % (ref 0–5)
HEMATOCRIT: 37.5 % — AB (ref 39.0–52.0)
Hemoglobin: 12.5 g/dL — ABNORMAL LOW (ref 13.0–17.0)
Lymphocytes Relative: 30 % (ref 12–46)
Lymphs Abs: 1.9 10*3/uL (ref 0.7–4.0)
MCH: 30.9 pg (ref 26.0–34.0)
MCHC: 33.3 g/dL (ref 30.0–36.0)
MCV: 92.8 fL (ref 78.0–100.0)
MONO ABS: 0.6 10*3/uL (ref 0.1–1.0)
MPV: 10.9 fL (ref 8.6–12.4)
Monocytes Relative: 9 % (ref 3–12)
NEUTROS ABS: 3.8 10*3/uL (ref 1.7–7.7)
Neutrophils Relative %: 59 % (ref 43–77)
Platelets: 179 10*3/uL (ref 150–400)
RBC: 4.04 MIL/uL — ABNORMAL LOW (ref 4.22–5.81)
RDW: 13.8 % (ref 11.5–15.5)
WBC: 6.4 10*3/uL (ref 4.0–10.5)

## 2014-09-11 ENCOUNTER — Emergency Department (HOSPITAL_COMMUNITY): Payer: MEDICARE

## 2014-09-11 ENCOUNTER — Other Ambulatory Visit: Payer: Self-pay

## 2014-09-11 ENCOUNTER — Emergency Department (HOSPITAL_COMMUNITY)
Admission: EM | Admit: 2014-09-11 | Discharge: 2014-09-11 | Disposition: A | Discharge status: Home / Self Care | Payer: MEDICARE | Attending: Emergency Medicine | Admitting: Emergency Medicine

## 2014-09-11 ENCOUNTER — Encounter (HOSPITAL_COMMUNITY): Payer: Self-pay | Admitting: Emergency Medicine

## 2014-09-11 DIAGNOSIS — Z7982 Long term (current) use of aspirin: Secondary | ICD-10-CM | POA: Insufficient documentation

## 2014-09-11 DIAGNOSIS — N182 Chronic kidney disease, stage 2 (mild): Secondary | ICD-10-CM | POA: Diagnosis not present

## 2014-09-11 DIAGNOSIS — Z79899 Other long term (current) drug therapy: Secondary | ICD-10-CM | POA: Insufficient documentation

## 2014-09-11 DIAGNOSIS — E785 Hyperlipidemia, unspecified: Secondary | ICD-10-CM | POA: Insufficient documentation

## 2014-09-11 DIAGNOSIS — F0150 Vascular dementia without behavioral disturbance: Secondary | ICD-10-CM | POA: Diagnosis not present

## 2014-09-11 DIAGNOSIS — Z8673 Personal history of transient ischemic attack (TIA), and cerebral infarction without residual deficits: Secondary | ICD-10-CM | POA: Diagnosis not present

## 2014-09-11 DIAGNOSIS — M109 Gout, unspecified: Secondary | ICD-10-CM | POA: Diagnosis not present

## 2014-09-11 DIAGNOSIS — K219 Gastro-esophageal reflux disease without esophagitis: Secondary | ICD-10-CM | POA: Insufficient documentation

## 2014-09-11 DIAGNOSIS — I129 Hypertensive chronic kidney disease with stage 1 through stage 4 chronic kidney disease, or unspecified chronic kidney disease: Secondary | ICD-10-CM | POA: Diagnosis not present

## 2014-09-11 DIAGNOSIS — M25572 Pain in left ankle and joints of left foot: Secondary | ICD-10-CM | POA: Diagnosis present

## 2014-09-11 DIAGNOSIS — Z8669 Personal history of other diseases of the nervous system and sense organs: Secondary | ICD-10-CM | POA: Diagnosis not present

## 2014-09-11 DIAGNOSIS — Z9889 Other specified postprocedural states: Secondary | ICD-10-CM | POA: Diagnosis not present

## 2014-09-11 DIAGNOSIS — Z951 Presence of aortocoronary bypass graft: Secondary | ICD-10-CM | POA: Diagnosis not present

## 2014-09-11 MED ORDER — PREDNISONE 10 MG PO TABS: ORAL_TABLET | ORAL | Status: DC

## 2014-09-11 NOTE — ED Provider Notes (Signed)
CSN: [id number]     Arrival date & time 09/11/14  [id number] History   First MD Initiated Contact with Patient 09/11/14 0932     Chief Complaint  Patient presents with  . Foot Pain     (Consider location/radiation/quality/duration/timing/severity/associated sxs/prior Treatment) Patient is a 81 y.o. male presenting with lower extremity pain. The history is provided by the patient. No language interpreter was used.  Foot Pain This is a new problem. The current episode started more than 1 month ago. The problem occurs constantly. The problem has been gradually worsening. Associated symptoms include myalgias. Pertinent negatives include no joint swelling. Nothing aggravates the symptoms. He has tried nothing for the symptoms. The treatment provided moderate relief.  Pt complains of pain in his left foot.  Pt could not tolerate putting weight on his foot today.  Past Medical History  Diagnosis Date  . Hypertension   . Hyperlipidemia   . GERD (gastroesophageal reflux disease)   . Cerebrovascular disease 2005    left parietal CVA  . Macular degeneration     + cataract; legally blind  . Arteriosclerotic cardiovascular disease (ASCVD) 1982    Acute MI in 1982; returned with non-ST segment elevation MI in 07/2010; emergency CABG-Dr. Laneta SimmersBartle  . Chronic kidney disease, stage 2, mildly decreased GFR   . Vascular dementia   . Stroke    Past Surgical History  Procedure Laterality Date  . [facility]  . Tonsillectomy    . Hernia repair    . Coronary artery bypass graft    . Esophagogastroduodenoscopy  09/20/11    schatzki's ring/hiatal hernia/antal erosion. bx showed gastritis but no h.pylori  . Savory dilation  09/20/2011    Procedure: SAVORY DILATION;  Surgeon: Corbin Adeobert M Rourk, MD;  Location: [facility];  Service: Endoscopy;  Laterality: N/A;  Elease Hashimoto. Maloney dilation  09/20/2011    Procedure: Elease HashimotoMALONEY DILATION;  Surgeon: Corbin Adeobert M Rourk, MD;  Location: [facility];  Service:  Endoscopy;  Laterality: N/A;  . Cardiac surgery     Family History  Problem Relation Age of Onset  . Heart failure Brother    History  Substance Use Topics  . Smoking status: Never Smoker   . Smokeless tobacco: Never Used  . Alcohol Use: No    Review of Systems  Musculoskeletal: Positive for myalgias. Negative for joint swelling.  All other systems reviewed and are negative.     Allergies  Remeron  Home Medications   Prior to Admission medications   Medication Sig Start Date End Date Taking? Authorizing Provider  aspirin EC 81 MG tablet Take 81 mg by mouth daily.   Yes Historical Provider, MD  atorvastatin (LIPITOR) 20 MG tablet Take 1 tablet (20 mg total) by mouth daily. 03/10/14  Yes Salley ScarletKawanta F Peculiar, MD  carvedilol (COREG) 3.125 MG tablet TAKE (1) TABLET BY MOUTH TWICE A DAY WITH MEALS (BREAKFAST AND SUPPER). Patient taking differently: Take 3.125 mg by mouth 2 (two) times daily with a meal. TAKE (1) TABLET BY MOUTH TWICE A DAY WITH MEALS (BREAKFAST AND SUPPER). 03/10/14  Yes Salley ScarletKawanta F Fairbank, MD  Lactulose 20 GM/30ML SOLN Give 30ml po daily for constipation as needed Patient taking differently: Take 15 mLs by mouth daily. Give 30ml po daily for constipation as needed 08/20/14  Yes Salley ScarletKawanta F Rockham, MD  Multiple Vitamin (MULTIVITAMIN WITH MINERALS) TABS tablet Take 1 tablet by mouth daily.   Yes Historical Provider, MD  omeprazole (PRILOSEC) 20 MG  capsule Take 1 capsule (20 mg total) by mouth daily. 03/10/14  Yes Salley Scarlet, MD  oxybutynin (DITROPAN) 5 MG tablet TAKE (1) TABLET BY MOUTH TWICE DAILY. Patient taking differently: Take 5 mg by mouth 2 (two) times daily. TAKE (1) TABLET BY MOUTH TWICE DAILY. 03/10/14  Yes Salley Scarlet, MD  Phenol-Glycerin [facility] MAX SORE THROAT MT) Use as directed 2 sprays in the mouth or throat daily as needed (throat pain).   Yes Historical Provider, MD  ranitidine (ZANTAC) 150 MG tablet TAKE (1) TABLET BY MOUTH AT BEDTIME. 08/14/14  Yes  Salley Scarlet, MD   BP 139/79 mmHg  Pulse 63  Temp(Src) 98.2 F (36.8 C) (Oral)  Resp 16  Ht  (1.803 m)  Wt 173 lb (78.472 kg)  BMI 24.14 kg/m2  SpO2 98% Physical Exam  Constitutional: He is oriented to person, place, and time. He appears well-developed and well-nourished.  HENT:  Head: Normocephalic and atraumatic.  Eyes: Pupils are equal, round, and reactive to light.  Neck: Normal range of motion.  Cardiovascular: Normal rate.   Pulmonary/Chest: Effort normal.  Musculoskeletal: He exhibits tenderness.  Slight swelling left foot, no deformity,  From with pain  Neurological: He is alert and oriented to person, place, and time. He has normal reflexes.  Skin: Skin is warm.  Psychiatric: He has a normal mood and affect.    ED Course  Procedures (including critical care time) Labs Review Labs Reviewed - No data to display  Imaging Review Dg Foot Complete Left  09/11/2014   CLINICAL DATA:  Foot pain and swelling, no injury  EXAM: LEFT FOOT - COMPLETE 3+ VIEW  COMPARISON:  None.  FINDINGS: Normal alignment no fracture. Mild degenerative change in the first MTP. Arterial calcification.  IMPRESSION: No acute abnormality.   Electronically Signed   By: Marlan Palau M.D.   On: 09/11/2014 09:40     EKG Interpretation None      MDM  Possible gout,   I will treat with prednisone.  Pt advised to see his MD for recheck.   Final diagnoses:  Gout of left foot, unspecified cause, unspecified chronicity    Prednisone taper    Elson Areas, PA-C 09/11/14 1009  Bethann Berkshire, MD 09/11/14 1413

## 2014-09-11 NOTE — Discharge Instructions (Signed)
Low-Purine Diet  Purines are compounds that affect the level of uric acid in your body. A low-purine diet is a diet that is low in purines. Eating a low-purine diet can prevent the level of uric acid in your body from getting too high and causing gout or kidney stones or both.  WHAT DO I NEED TO KNOW ABOUT THIS DIET?  · Choose low-purine foods. Examples of low-purine foods are listed in the next section.  · Drink plenty of fluids, especially water. Fluids can help remove uric acid from your body. Try to drink 8-16 cups (1.9-3.8 L) a day.  · Limit foods high in fat, especially saturated fat, as fat makes it harder for the body to get rid of uric acid. Foods high in saturated fat include pizza, cheese, ice cream, whole milk, fried foods, and gravies. Choose foods that are lower in fat and lean sources of protein. Use olive oil when cooking as it contains healthy fats that are not high in saturated fat.  · Limit alcohol. Alcohol interferes with the elimination of uric acid from your body. If you are having a gout attack, avoid all alcohol.  · Keep in mind that different people's bodies react differently to different foods. You will probably learn over time which foods do or do not affect you. If you discover that a food tends to cause your gout to flare up, avoid eating that food. You can more freely enjoy foods that do not cause problems. If you have any questions about a food item, talk to your dietitian or health care provider.  WHICH FOODS ARE LOW, MODERATE, AND HIGH IN PURINES?  The following is a list of foods that are low, moderate, and high in purines. You can eat any amount of the foods that are low in purines. You may be able to have small amounts of foods that are moderate in purines. Ask your health care provider how much of a food moderate in purines you can have. Avoid foods high in purines.  Grains  · Foods low in purines: Enriched white bread, pasta, rice, cake, cornbread, popcorn.  · Foods moderate in  purines: Whole-grain breads and cereals, wheat germ, bran, oatmeal. Uncooked oatmeal. Dry wheat bran or wheat germ.  · Foods high in purines: Pancakes, French toast, biscuits, muffins.  Vegetables  · Foods low in purines: All vegetables, except those that are moderate in purines.  · Foods moderate in purines: Asparagus, cauliflower, spinach, mushrooms, green peas.  Fruits  · All fruits are low in purines.  Meats and other Protein Foods  · Foods low in purines: Eggs, nuts, peanut butter.  · Foods moderate in purines: 80-90% lean beef, lamb, veal, pork, poultry, fish, eggs, peanut butter, nuts. Crab, lobster, oysters, and shrimp. Cooked dried beans, peas, and lentils.  · Foods high in purines: Anchovies, sardines, herring, mussels, tuna, codfish, scallops, trout, and haddock. Bacon. Organ meats (such as liver or kidney). Tripe. Game meat. Goose. Sweetbreads.  Dairy  · All dairy foods are low in purines. Low-fat and fat-free dairy products are best because they are low in saturated fat.  Beverages  · Drinks low in purines: Water, carbonated beverages, tea, coffee, cocoa.  · Drinks moderate in purines: Soft drinks and other drinks sweetened with high-fructose corn syrup. Juices. To find whether a food or drink is sweetened with high-fructose corn syrup, look at the ingredients list.  · Drinks high in purines: Alcoholic beverages (such as beer).  Condiments  · Foods   low in purines: Salt, herbs, olives, pickles, relishes, vinegar.  · Foods moderate in purines: Butter, margarine, oils, mayonnaise.  Fats and Oils  · Foods low in purines: All types, except gravies and sauces made with meat.  · Foods high in purines: Gravies and sauces made with meat.  Other Foods  · Foods low in purines: Sugars, sweets, gelatin. Cake. Soups made without meat.  · Foods moderate in purines: Meat-based or fish-based soups, broths, or bouillons. Foods and drinks sweetened with high-fructose corn syrup.  · Foods high in purines: High-fat desserts  (such as ice cream, cookies, cakes, pies, doughnuts, and chocolate).  Contact your dietitian for more information on foods that are not listed here.  Document Released: 06/18/2010 Document Revised: 02/26/2013 Document Reviewed: 01/28/2013  [employer]® Patient Information ©2015 [employer]. This information is not intended to replace advice given to you by your health care provider. Make sure you discuss any questions you have with your health care provider.

## 2014-09-11 NOTE — ED Notes (Signed)
Pt reports L foot pain that began approx 1 month ago. Pt states the foot becomes edematous and has slight bruising.

## 2014-11-03 ENCOUNTER — Encounter: Payer: Self-pay | Admitting: Family Medicine

## 2014-11-03 ENCOUNTER — Ambulatory Visit (INDEPENDENT_AMBULATORY_CARE_PROVIDER_SITE_OTHER): Payer: MEDICARE | Admitting: Family Medicine

## 2014-11-03 VITALS — BP 120/64 | HR 62 | Temp 98.3°F | Resp 18 | Ht 71.0 in | Wt 165.0 lb

## 2014-11-03 DIAGNOSIS — K5901 Slow transit constipation: Secondary | ICD-10-CM

## 2014-11-03 DIAGNOSIS — F0150 Vascular dementia without behavioral disturbance: Secondary | ICD-10-CM

## 2014-11-03 DIAGNOSIS — R634 Abnormal weight loss: Secondary | ICD-10-CM | POA: Diagnosis not present

## 2014-11-03 DIAGNOSIS — I10 Essential (primary) hypertension: Secondary | ICD-10-CM | POA: Diagnosis not present

## 2014-11-03 DIAGNOSIS — N182 Chronic kidney disease, stage 2 (mild): Secondary | ICD-10-CM

## 2014-11-03 LAB — CBC WITH DIFFERENTIAL/PLATELET
Basophils Absolute: 0 10*3/uL (ref 0.0–0.1)
Basophils Relative: 0 % (ref 0–1)
Eosinophils Absolute: 0.1 10*3/uL (ref 0.0–0.7)
Eosinophils Relative: 2 % (ref 0–5)
HCT: 36.4 % — ABNORMAL LOW (ref 39.0–52.0)
Hemoglobin: 12.6 g/dL — ABNORMAL LOW (ref 13.0–17.0)
LYMPHS ABS: 1.9 10*3/uL (ref 0.7–4.0)
Lymphocytes Relative: 33 % (ref 12–46)
MCH: 31.3 pg (ref 26.0–34.0)
MCHC: 34.6 g/dL (ref 30.0–36.0)
MCV: 90.3 fL (ref 78.0–100.0)
MPV: 10.5 fL (ref 8.6–12.4)
Monocytes Absolute: 0.6 10*3/uL (ref 0.1–1.0)
Monocytes Relative: 10 % (ref 3–12)
NEUTROS PCT: 55 % (ref 43–77)
Neutro Abs: 3.2 10*3/uL (ref 1.7–7.7)
PLATELETS: 170 10*3/uL (ref 150–400)
RBC: 4.03 MIL/uL — ABNORMAL LOW (ref 4.22–5.81)
RDW: 13.7 % (ref 11.5–15.5)
WBC: 5.9 10*3/uL (ref 4.0–10.5)

## 2014-11-03 LAB — BASIC METABOLIC PANEL WITH GFR
BUN: 26 mg/dL — AB (ref 7–25)
CALCIUM: 9.2 mg/dL (ref 8.6–10.3)
CO2: 24 mmol/L (ref 20–31)
CREATININE: 1.71 mg/dL — AB (ref 0.70–1.11)
Chloride: 107 mmol/L (ref 98–110)
GFR, EST NON AFRICAN AMERICAN: 36 mL/min — AB (ref >=60)
GFR, Est African American: 41 mL/min — ABNORMAL LOW (ref >=60)
Glucose, Bld: 102 mg/dL — ABNORMAL HIGH (ref 70–99)
Potassium: 4.5 mmol/L (ref 3.5–5.3)
Sodium: 140 mmol/L (ref 135–146)

## 2014-11-03 NOTE — Assessment & Plan Note (Signed)
Recheck Creatinine levels, I had a long discussed with patient and his daughter about him not losing weight purposely. His age she does not need to be dropping significant weight. Advised her to get him in shore twice a day also to broaden his diet just to get him to eat on a regular basis. I'm not to start any appetite medications at this time. He does have underlying dementia so this could be some of the dementia playing a part in his not wanting to eat

## 2014-11-03 NOTE — Progress Notes (Signed)
Patient ID: Alex Williamson, male   DOB: [birth year], 81 y.o.   MRN: [id number]   Subjective:    Patient ID: Alex Williamson, male    DOB: [birth year], 81 y.o.   MRN: [id number]  Patient presents for 3 month F/U and Letter  patient here to follow-up labs. Her last visit his creatinine was elevated to 1.79. He had not been eating and drinking as well. His daughter states he is now very picky and stated he wanted to lose weight therefore often he would not eat what she gave him. She is now started to give him in shore at lunch time. He thinks that he is now adequate weight he thought it would help his medical problems if he dropped a lot of weight. His bowels are much improved with the use of lactulose and a stool softener. They have no other new concerns today. She does tell me that her brother will be his primary caregiver for about a week and at the same time he was called to have jury duty back in [place] and he will need documentation that he will need to be here to help take care of his father as she will not be available. I advised him that they can bring me or fax me a letter stating when history duty is and I will provide him with documentation so that he can care for his father.    Review Of Systems:  GEN- denies fatigue, fever, weight loss,weakness, recent illness HEENT- denies eye drainage, change in vision, nasal discharge, CVS- denies chest pain, palpitations RESP- denies SOB, cough, wheeze ABD- denies N/V, change in stools, abd pain GU- denies dysuria, hematuria, dribbling, incontinence MSK- denies joint pain, muscle aches, injury Neuro- denies headache, dizziness, syncope, seizure activity       Objective:    BP 120/64 mmHg  Pulse 62  Temp(Src) 98.3 F (36.8 C) (Oral)  Resp 18  Ht  (1.803 m)  Wt 165 lb (74.844 kg)  BMI 23.02 kg/m2 GEN- NAD, alert and oriented x3 HEENT- PERRL, EOMI, non injected sclera, pink conjunctiva, MMM, oropharynx clear Neck- Supple, no  thyromegaly CVS- RRR,2/6 SEM RESP-CTAB ABD-NABS,soft,NT,ND EXT- No edema Pulses- Radial  2+        Assessment & Plan:      Problem List Items Addressed This Visit    Essential hypertension   Constipation   Chronic kidney disease, stage 2, mildly decreased GFR - Primary   Relevant Orders   BASIC METABOLIC PANEL WITH GFR   CBC with Differential/Platelet      Note: This dictation was prepared with Dragon dictation along with smaller phrase technology. Any transcriptional errors that result from this process are unintentional.

## 2014-11-03 NOTE — Patient Instructions (Addendum)
Give ensure twice a day  Send information - Fax [phone]- for [employer] Duty Information  Continue current medications F/U 4 months

## 2014-11-03 NOTE — Assessment & Plan Note (Signed)
Improved with lactulose and stool softner

## 2014-11-03 NOTE — Assessment & Plan Note (Signed)
Controlled, no change to meds 

## 2015-01-02 ENCOUNTER — Encounter: Payer: Self-pay | Admitting: Family Medicine

## 2015-01-02 ENCOUNTER — Ambulatory Visit (INDEPENDENT_AMBULATORY_CARE_PROVIDER_SITE_OTHER): Payer: MEDICARE | Admitting: Family Medicine

## 2015-01-02 VITALS — BP 124/66 | HR 62 | Temp 98.0°F | Resp 14 | Ht 71.0 in | Wt 165.0 lb

## 2015-01-02 DIAGNOSIS — J988 Other specified respiratory disorders: Secondary | ICD-10-CM

## 2015-01-02 DIAGNOSIS — B86 Scabies: Secondary | ICD-10-CM | POA: Diagnosis not present

## 2015-01-02 MED ORDER — AZITHROMYCIN 250 MG PO TABS: ORAL_TABLET | ORAL | Status: DC

## 2015-01-02 MED ORDER — PERMETHRIN 5 % EX CREA: 1.0000 "application " | TOPICAL_CREAM | Freq: Once | CUTANEOUS | Status: DC

## 2015-01-02 NOTE — Patient Instructions (Signed)
Mucinex DM twice a day  Cortisone to his bug bites  Take antibiotics as prescribed  Premethrin apply from neck down  Get the flu shot in 2 weeks  F/U as previous

## 2015-01-02 NOTE — Progress Notes (Signed)
Patient ID: Alex Williamson, male   DOB: 09/10/1927, 81 y.o.   MRN: [id number]   Subjective:    Patient ID: Alex Williamson, male    DOB: 12/06/1927, 81 y.o.   MRN: [id number]  Patient presents for Illness  patient here with cough production worsening over the past week. Denies any fever mild nasal congestion. No known sick contacts. Has not been giving him Benadryl and Coricidin cough medicine. His sputum is becoming thicker and more productive. He has not had any shortness of breath. They're also concerned about rash is itching all over he specifically was also sleeps with the animals. He said scratching" between his fingers as well as on his legs and his arms.    Review Of Systems:  GEN- denies fatigue, fever, weight loss,weakness, recent illness HEENT- denies eye drainage, change in vision, nasal discharge, CVS- denies chest pain, palpitations RESP- denies SOB, +cough, wheeze ABD- denies N/V, change in stools, abd pain GU- denies dysuria, hematuria, dribbling, incontinence Neuro- denies headache, dizziness, syncope, seizure activity       Objective:    BP 124/66 mmHg  Pulse 62  Temp(Src) 98 F (36.7 C) (Oral)  Resp 14  Ht 5\' 11"  (1.803 m)  Wt 165 lb (74.844 kg)  BMI 23.02 kg/m2  SpO2 99% GEN- NAD, alert and oriented x3 HEENT- PERRL, EOMI, non injected sclera, pink conjunctiva, MMM, oropharynx clear Neck- Supple, no LAD CVS- RRR, 2/6 SEM  RESP-course RHonchi bilat lower bases, normal WOB, no wheeze  Skin- erythema with excorations in web spaces, few maculapular lesions on neck, arm, lower legs, no lesions on abdomen, chest, back  EXT- No edema Pulses- Radial - 2+        Assessment & Plan:      Problem List Items Addressed This Visit    None    Visit Diagnoses    Respiratory infection    -  Primary    High risk for PNA, will treat with azithromycin, mucinex DM, aferbile,normal oxygen sat, CXR if not improved    Relevant Medications    azithromycin (ZITHROMAX)  250 MG tablet    Scabies        Concern for mites, based on location of rash and irritants, treat with permethrin, okay to use cortisone 1%       Note: This dictation was prepared with Dragon dictation along with smaller phrase technology. Any transcriptional errors that result from this process are unintentional.

## 2015-03-06 ENCOUNTER — Ambulatory Visit: Payer: MEDICARE | Admitting: Family Medicine

## 2015-03-26 ENCOUNTER — Ambulatory Visit (INDEPENDENT_AMBULATORY_CARE_PROVIDER_SITE_OTHER): Payer: Self-pay | Admitting: Otolaryngology

## 2015-04-01 ENCOUNTER — Other Ambulatory Visit: Payer: Self-pay | Admitting: Family Medicine

## 2015-04-01 NOTE — Telephone Encounter (Signed)
Refill appropriate and filled per protocol. 

## 2015-05-13 ENCOUNTER — Other Ambulatory Visit: Payer: Self-pay | Admitting: Family Medicine

## 2015-05-13 ENCOUNTER — Telehealth: Payer: Self-pay | Admitting: Family Medicine

## 2015-05-13 NOTE — Telephone Encounter (Signed)
appt made for Friday at 1230

## 2015-05-13 NOTE — Telephone Encounter (Signed)
Pt's daughter called and is concerned about his dementia. She says that he is frustrated all the time and is wondering if Dr. Jeanice Limurham thinks it would be a good idea to add a medication for his nerves.  Please advise. [name] [phone]

## 2015-05-13 NOTE — Telephone Encounter (Signed)
MD please advise

## 2015-05-13 NOTE — Telephone Encounter (Signed)
Pt needs appt

## 2015-05-15 ENCOUNTER — Ambulatory Visit (INDEPENDENT_AMBULATORY_CARE_PROVIDER_SITE_OTHER): Payer: MEDICARE | Admitting: Family Medicine

## 2015-05-15 ENCOUNTER — Encounter: Payer: Self-pay | Admitting: Family Medicine

## 2015-05-15 VITALS — BP 106/66 | HR 64 | Temp 98.8°F | Resp 18 | Wt 166.0 lb

## 2015-05-15 DIAGNOSIS — F0150 Vascular dementia without behavioral disturbance: Secondary | ICD-10-CM | POA: Diagnosis not present

## 2015-05-15 DIAGNOSIS — N182 Chronic kidney disease, stage 2 (mild): Secondary | ICD-10-CM | POA: Diagnosis not present

## 2015-05-15 DIAGNOSIS — F0391 Unspecified dementia with behavioral disturbance: Secondary | ICD-10-CM

## 2015-05-15 DIAGNOSIS — F03918 Unspecified dementia, unspecified severity, with other behavioral disturbance: Secondary | ICD-10-CM

## 2015-05-15 MED ORDER — QUETIAPINE FUMARATE 25 MG PO TABS: 25.0000 mg | ORAL_TABLET | Freq: Every day | ORAL | Status: DC

## 2015-05-15 NOTE — Patient Instructions (Signed)
Tylenol for knee Try seroquel at bedtime F/U 4 months

## 2015-05-15 NOTE — Assessment & Plan Note (Signed)
Recheck renal function. ?

## 2015-05-15 NOTE — Progress Notes (Signed)
Patient ID: Alex StackCharles R Williamson, male   DOB: 06/27/1927, 82 y.o.   MRN: [id number]   Subjective:    Patient ID: Alex Williamson, male    DOB: 02/26/1928, 82 y.o.   MRN: [id number]  Patient presents for family issues  patient here with his daughter. He's been having worsening behavioral problems with in the setting of his dimension. We have discussed in the past that this is likely going to occur. He is off of the dementia medications Aricept and Namenda because of interactions with his other medications and he went to be on medications control his bladder. She will like to try something to help Combivent night and help him sleep. He just seems angry all the time especially when he cannot remember something. In the past to try trazodone but this caused hallucinations. He has not had any recent illness no fever he's eating well his weight is stable.    Review Of Systems:  GEN- denies fatigue, fever, weight loss,weakness, recent illness HEENT- denies eye drainage, change in vision, nasal discharge, CVS- denies chest pain, palpitations RESP- denies SOB, cough, wheeze ABD- denies N/V, change in stools, abd pain GU- denies dysuria, hematuria, dribbling, incontinence MSK- denies joint pain, muscle aches, injury Neuro- denies headache, dizziness, syncope, seizure activity       Objective:    BP 106/66 mmHg  Pulse 64  Temp(Src) 98.8 F (37.1 C)  Resp 18  Wt 166 lb (75.297 kg) GEN- NAD, alert and oriented x2 HEENT- PERRL, EOMI, non injected sclera, pink conjunctiva, MMM, oropharynx clear Neck- Supple, no thyromegaly CVS- RRR, 2/6SEM RESP-CTAB ABD-NABS,soft,NT,ND Psych- pleasant , not depressed or anxious appearing  EXT- No edema Pulses- Radial, 2+        Assessment & Plan:      Problem List Items Addressed This Visit    Vascular dementia - Primary     He has known dementia which is deteriorating with also behavioral problems and anger issues. Clinic try him on Seroquel 25 mg at  bedtime. He was to continue his bladder control medication was helps him with quality of life.      Relevant Medications   QUEtiapine (SEROQUEL) 25 MG tablet   Dementia with behavioral disturbance   Relevant Medications   QUEtiapine (SEROQUEL) 25 MG tablet   Chronic kidney disease, stage 2, mildly decreased GFR    Recheck renal function      Relevant Orders   CBC with Differential/Platelet   Comprehensive metabolic panel      Note: This dictation was prepared with Dragon dictation along with smaller phrase technology. Any transcriptional errors that result from this process are unintentional.

## 2015-05-15 NOTE — Assessment & Plan Note (Signed)
He has known dementia which is deteriorating with also behavioral problems and anger issues. Clinic try him on Seroquel 25 mg at bedtime. He was to continue his bladder control medication was helps him with quality of life.

## 2015-05-16 LAB — CBC WITH DIFFERENTIAL/PLATELET
Basophils Absolute: 0 10*3/uL (ref 0.0–0.1)
Basophils Relative: 0 % (ref 0–1)
EOS ABS: 0.1 10*3/uL (ref 0.0–0.7)
EOS PCT: 2 % (ref 0–5)
HCT: 38 % — ABNORMAL LOW (ref 39.0–52.0)
Hemoglobin: 13.1 g/dL (ref 13.0–17.0)
Lymphocytes Relative: 39 % (ref 12–46)
Lymphs Abs: 2.7 10*3/uL (ref 0.7–4.0)
MCH: 31.6 pg (ref 26.0–34.0)
MCHC: 34.5 g/dL (ref 30.0–36.0)
MCV: 91.6 fL (ref 78.0–100.0)
MPV: 10.8 fL (ref 8.6–12.4)
Monocytes Absolute: 0.9 10*3/uL (ref 0.1–1.0)
Monocytes Relative: 13 % — ABNORMAL HIGH (ref 3–12)
Neutro Abs: 3.2 10*3/uL (ref 1.7–7.7)
Neutrophils Relative %: 46 % (ref 43–77)
PLATELETS: 171 10*3/uL (ref 150–400)
RBC: 4.15 MIL/uL — ABNORMAL LOW (ref 4.22–5.81)
RDW: 13.6 % (ref 11.5–15.5)
WBC: 7 10*3/uL (ref 4.0–10.5)

## 2015-05-16 LAB — COMPREHENSIVE METABOLIC PANEL
ALT: 11 U/L (ref 9–46)
AST: 16 U/L (ref 10–35)
Albumin: 3.6 g/dL (ref 3.6–5.1)
Alkaline Phosphatase: 57 U/L (ref 40–115)
BUN: 26 mg/dL — AB (ref 7–25)
CHLORIDE: 106 mmol/L (ref 98–110)
CO2: 26 mmol/L (ref 20–31)
Calcium: 9.4 mg/dL (ref 8.6–10.3)
Creat: 1.58 mg/dL — ABNORMAL HIGH (ref 0.70–1.11)
GLUCOSE: 97 mg/dL (ref 70–99)
POTASSIUM: 4.8 mmol/L (ref 3.5–5.3)
Sodium: 142 mmol/L (ref 135–146)
Total Bilirubin: 0.4 mg/dL (ref 0.2–1.2)
Total Protein: 6.3 g/dL (ref 6.1–8.1)

## 2015-06-26 ENCOUNTER — Other Ambulatory Visit: Payer: Self-pay | Admitting: Family Medicine

## 2015-06-26 NOTE — Telephone Encounter (Signed)
Refill appropriate and filled per protocol. 

## 2015-07-03 ENCOUNTER — Ambulatory Visit (HOSPITAL_COMMUNITY)
Admission: RE | Admit: 2015-07-03 | Discharge: 2015-07-03 | Disposition: A | Discharge status: Home / Self Care | Payer: MEDICARE | Source: Ambulatory Visit | Attending: Family Medicine | Admitting: Family Medicine

## 2015-07-03 ENCOUNTER — Ambulatory Visit (INDEPENDENT_AMBULATORY_CARE_PROVIDER_SITE_OTHER): Payer: MEDICARE | Admitting: Family Medicine

## 2015-07-03 ENCOUNTER — Encounter: Payer: Self-pay | Admitting: Family Medicine

## 2015-07-03 VITALS — BP 132/80 | HR 76 | Temp 98.3°F | Resp 12 | Ht 71.0 in | Wt 163.0 lb

## 2015-07-03 DIAGNOSIS — R2990 Unspecified symptoms and signs involving the nervous system: Secondary | ICD-10-CM

## 2015-07-03 DIAGNOSIS — T17998A Other foreign object in respiratory tract, part unspecified causing other injury, initial encounter: Secondary | ICD-10-CM

## 2015-07-03 DIAGNOSIS — F03918 Unspecified dementia, unspecified severity, with other behavioral disturbance: Secondary | ICD-10-CM

## 2015-07-03 DIAGNOSIS — R4781 Slurred speech: Secondary | ICD-10-CM

## 2015-07-03 DIAGNOSIS — I639 Cerebral infarction, unspecified: Secondary | ICD-10-CM | POA: Diagnosis not present

## 2015-07-03 DIAGNOSIS — T17908A Unspecified foreign body in respiratory tract, part unspecified causing other injury, initial encounter: Secondary | ICD-10-CM

## 2015-07-03 DIAGNOSIS — F0391 Unspecified dementia with behavioral disturbance: Secondary | ICD-10-CM

## 2015-07-03 DIAGNOSIS — I679 Cerebrovascular disease, unspecified: Secondary | ICD-10-CM

## 2015-07-03 MED ORDER — AMOXICILLIN-POT CLAVULANATE 875-125 MG PO TABS: 1.0000 | ORAL_TABLET | Freq: Two times a day (BID) | ORAL | Status: DC

## 2015-07-03 MED ORDER — CLOPIDOGREL BISULFATE 75 MG PO TABS: 75.0000 mg | ORAL_TABLET | Freq: Every day | ORAL | Status: DC

## 2015-07-03 MED ORDER — TRIAMCINOLONE ACETONIDE 0.1 % EX CREA: 1.0000 "application " | TOPICAL_CREAM | Freq: Two times a day (BID) | CUTANEOUS | Status: DC

## 2015-07-03 NOTE — Assessment & Plan Note (Signed)
It is difficult to tell if it was a Seroquel that was causing some behavior changes or the fact that he was out of town and not in his typical element. This may be worth trying again since he is now back home as he did not have any problems initially with the medication.

## 2015-07-03 NOTE — Progress Notes (Signed)
Patient ID: Alex StackCharles R Williamson, male   DOB: 12/16/1927, 82 y.o.   MRN: [id number]     Subjective:    Patient ID: Alex Stackharles R Williamson, male    DOB: 05/10/1927, 82 y.o.   MRN: [id number]  Patient presents for Slurred Speech; Illness; and Meidcation Management Issue here with his daughter. For the past 2 days he's had some slurred speech he's also having more difficulty with swallowing his liquids. She states that he was in his fairly normal state of health he returned from [place] week ago. When he then began to slur his speech. She did not notice any facial droop or weakness. He then began using his cane as he felt imbalance. He tried to give him some liquids yesterday and he had coughing spells and now he has chest congestion and a deep cough. He has not had any fever no weakness. He does have history of mini strokes in the past by evident on his MRI brain.  He was in the was in was bitten by some chiggers they did try some topical Benadryl cream this is helped somewhat the itching he still has itching on his arms.  He was placed on Seroquel for sleep and mood was doing well while he was here when he went to [place] to stay with another daughter he began having hallucinations per report there for the medication was stopped while he was in [place]    Review Of Systems: per above   GEN- denies fatigue, fever, weight loss,weakness, recent illness HEENT- denies eye drainage, change in vision, nasal discharge, CVS- denies chest pain, palpitations RESP- denies SOB, +cough, wheeze ABD- denies N/V, change in stools, abd pain GU- denies dysuria, hematuria, dribbling, incontinence MSK- denies joint pain, muscle aches, injury Neuro- denies headache, dizziness, syncope, seizure activity       Objective:    BP 132/80 mmHg  Pulse 76  Temp(Src) 98.3 F (36.8 C) (Oral)  Resp 12  Ht 5\' 11"  (1.803 m)  Wt 163 lb (73.936 kg)  BMI 22.74 kg/m2 GEN- NAD, alert and oriented x3 HEENT- PERRL, EOMI, non injected  sclera, pink conjunctiva, MMM, oropharynx clear Neck- Supple,  CVS- RRR,  2/6 SEM  RESP- mild upper airway congetion, no rales, no wheeze  ABD-NABS,soft,NT,ND Neuro-CNII-XIi intact, blind right eye, slurred speech evident, unsteady gait, neg rhomberg, normal alternating movements  EXT- No edema Skin- scabs and few insect bites on forearms, no lesions on trunk  Psych- baseline affect and mood  Pulses- Radial  2+        Assessment & Plan:      Problem List Items Addressed This Visit    None    Visit Diagnoses    Slurred speech    -  Primary    Concen for CVA with his speech and balance changes. Obtain CT head. He is not on ASA. CXR to be done due to concern for aspiration. I will also go ahead and start augmentin for possible ASA PNA and thickeners to his food.  Plan to start Plavix 75mg  once a day  His blood pressure is normal For his chiggers then few bites and irritation topical steroid given    Relevant Orders    CT Head Wo Contrast    Stroke-like symptoms        Relevant Orders    CT Head Wo Contrast    Aspiration into airway, initial encounter        Relevant Orders    DG Chest 2 View  Note: This dictation was prepared with Dragon dictation along with smaller phrase technology. Any transcriptional errors that result from this process are unintentional.

## 2015-07-03 NOTE — Patient Instructions (Addendum)
Take antibiotics  Take the CT scan  CXR to be done Use steroid cream  Thicken liquids- you can get from Pharmacy

## 2015-07-03 NOTE — Addendum Note (Signed)
Addended by: Milinda AntisURHAM, Guthrie Lemme F on: 07/03/2015 05:50 PM   Modules accepted: Orders

## 2015-07-10 ENCOUNTER — Ambulatory Visit (INDEPENDENT_AMBULATORY_CARE_PROVIDER_SITE_OTHER): Payer: MEDICARE | Admitting: Family Medicine

## 2015-07-10 DIAGNOSIS — F0150 Vascular dementia without behavioral disturbance: Secondary | ICD-10-CM

## 2015-07-10 DIAGNOSIS — I639 Cerebral infarction, unspecified: Secondary | ICD-10-CM

## 2015-07-10 NOTE — Progress Notes (Signed)
Patient ID: Alex StackCharles R Williamson, male   DOB: 01/23/1928, 82 y.o.   MRN: [id number]     Subjective:    Patient ID: Alex Stackharles R Williamson, male    DOB: 06/18/1927, 82 y.o.   MRN: [id number]  Patient presents for No chief complaint on file.  Here for follow-up of acute stroke. He was seen last week He had a few days of slurred speech and difficulty swallowing his liquids. This occurred after he returned from [place]. His family members did not notice any facial changes or any weakness therefore they did not go to the emergency room. By the time I seen him it had been over 48 hours. CT scan was done which did show subacute stroke. He was started on Plavix 75 mg once a day his blood pressure was normal during the visit. I also put him on thickener for his liquids. He was also given Augmentin for possible aspiration pneumonia and he been having increased cough and congestion after the episode. His daughter also states that he had choked  CT head without contrast:  IMPRESSION: 1. Interval subacute left cerebellar hemisphere infarct. 2. Stable atrophy, chronic small vessel white matter ischemic changes, old right thalamic infarct and probable additional old tiny bilateral thalamic lacunar infarcts.   PATIENT DID NOT SHOW UP FOR APPT    Review Of Systems:  GEN- denies fatigue, fever, weight loss,weakness, recent illness HEENT- denies eye drainage, change in vision, nasal discharge, CVS- denies chest pain, palpitations RESP- denies SOB, cough, wheeze ABD- denies N/V, change in stools, abd pain GU- denies dysuria, hematuria, dribbling, incontinence MSK- denies joint pain, muscle aches, injury Neuro- denies headache, dizziness, syncope, seizure activity       Objective:    There were no vitals taken for this visit. GEN- NAD, alert and oriented x3 HEENT- PERRL, EOMI, non injected sclera, pink conjunctiva, MMM, oropharynx clear Neck- Supple, no thyromegaly CVS- RRR, no  murmur RESP-CTAB ABD-NABS,soft,NT,ND EXT- No edema Pulses- Radial, DP- 2+        Assessment & Plan:      Problem List Items Addressed This Visit    None      Note: This dictation was prepared with Dragon dictation along with smaller phrase technology. Any transcriptional errors that result from this process are unintentional.

## 2015-07-18 ENCOUNTER — Encounter: Payer: Self-pay | Admitting: Family Medicine

## 2015-07-20 ENCOUNTER — Telehealth: Payer: Self-pay | Admitting: Family Medicine

## 2015-07-20 ENCOUNTER — Inpatient Hospital Stay: Payer: MEDICARE | Admitting: Family Medicine

## 2015-07-20 NOTE — Telephone Encounter (Signed)
Noted, resume plavix once a day

## 2015-07-20 NOTE — Telephone Encounter (Signed)
Pt's daughter, [name] would Darl Pikeslike to speak with someone regarding his Clopidogrel rx. She is confused on the dosage and believes she may be giving him the incorrect amount.  Please call [phone]

## 2015-07-20 NOTE — Telephone Encounter (Signed)
Call placed to patient daughter [name] to inquire.   Reports that she accidentally mixed up the directions of the rABTX and Palvix. ABTx was given once daily and Plavix was given BID x7 days.   States that she noted her mistake on Friday and Plavix was held over the weekend. ABTx was changed to BID.   Patient is currently out of Plavix. Call placed to pharmacy. Patient insurance will not cover prescription until 07/27/2015. Cash price for prescription is $11.86 for 7 days and $14.39 for 30 days.   Call placed to patient daughter and advised that she will have to pay out of pocket until the insurance will cover.   MD to be made aware.   Of note, patient had another appointment today. Hosp F/U re-scheduled by patient daughter to Friday.

## 2015-07-24 ENCOUNTER — Inpatient Hospital Stay: Payer: MEDICARE | Admitting: Family Medicine

## 2015-07-27 ENCOUNTER — Encounter: Payer: Self-pay | Admitting: Family Medicine

## 2015-07-27 ENCOUNTER — Ambulatory Visit (INDEPENDENT_AMBULATORY_CARE_PROVIDER_SITE_OTHER): Payer: MEDICARE | Admitting: Family Medicine

## 2015-07-27 VITALS — BP 140/74 | HR 62 | Temp 98.2°F | Resp 16 | Ht 71.0 in | Wt 159.0 lb

## 2015-07-27 DIAGNOSIS — I639 Cerebral infarction, unspecified: Secondary | ICD-10-CM

## 2015-07-27 NOTE — Patient Instructions (Signed)
Ultrasound of carotids  Continue thickener  Speech therapy to be set up  Call me back with names of aides company  F/U 3 months

## 2015-07-28 ENCOUNTER — Encounter: Payer: Self-pay | Admitting: Family Medicine

## 2015-07-28 NOTE — Progress Notes (Signed)
Patient ID: Alex Williamson, male   DOB: 04/20/1927, 82 y.o.   MRN: [id number]    Subjective:    Patient ID: Alex Stackharles R Williamson, male    DOB: 05/27/1927, 82 y.o.   MRN: [id number]  Patient presents for Hospital F/U  Patient here for follow-up on stroke. At her last visit he came in with strokelike symptoms CT of head was done on 428 which showed subacute left cerebellar hemisphere infarct he also had an old right thalamic infarct. He had noticeable weakness as well as slurred speech during her last visit. I started him on Plavix of note his daughter did mix up his antibiotics and his Plavix and she gave him Plavix 75 mg twice a day for about 3 days then she held it when she realized her mistake he is now on 75 mg once a day he has not had knee bleeding episodes or bruising. I also started him on antibiotic concern for development of aspiration pneumonia he has completed this his chest x-ray initially looked good. I started him on a thickener for his liquids.   Today there are no new concerns. They have not noted any other new neurological deficits. His speech is a little bit better daughter stable understanding him a little more. He is still very active out in the yard does not have any difficulties with his gait.    Review Of Systems:  GEN- denies fatigue, fever, weight loss,weakness, recent illness HEENT- denies eye drainage, change in vision, nasal discharge, CVS- denies chest pain, palpitations RESP- denies SOB, cough, wheeze ABD- denies N/V, change in stools, abd pain GU- denies dysuria, hematuria, dribbling, incontinence MSK- denies joint pain, muscle aches, injury Neuro- denies headache, dizziness, syncope, seizure activity       Objective:    BP 140/74 mmHg  Pulse 62  Temp(Src) 98.2 F (36.8 C) (Oral)  Resp 16  Ht 5\' 11"  (1.803 m)  Wt 159 lb (72.122 kg)  BMI 22.19 kg/m2 GEN- NAD, alert and oriented x3 HEENT- PERRL, EOMI, non injected sclera, pink conjunctiva, MMM, oropharynx  clear Neck- Supple, no bruit CVS- RRR,  2/6 SEM  RESP- CTAB ABD-NABS,soft,NT,ND Neuro-CNII-XIi intact, blind right eye, slurred speech evident, unsteady gait,  EXT- No edema Pulses- Radial  2+       Assessment & Plan:      Problem List Items Addressed This Visit    CVA (cerebral vascular accident) (HCC) - Primary    Subacute stroke, continue plavix, BP looks okay for age Will send to speech therapy, also advised to use the thickener for liquids to prevent aspiration Will also get carotid ultrasound On Lipitor already         Note: This dictation was prepared with Dragon dictation along with smaller phrase technology. Any transcriptional errors that result from this process are unintentional.

## 2015-07-28 NOTE — Assessment & Plan Note (Signed)
Subacute stroke, continue plavix, BP looks okay for age Will send to speech therapy, also advised to use the thickener for liquids to prevent aspiration Will also get carotid ultrasound On Lipitor already

## 2015-07-29 ENCOUNTER — Encounter: Payer: Self-pay | Admitting: Family Medicine

## 2015-08-17 ENCOUNTER — Other Ambulatory Visit: Payer: Self-pay | Admitting: Family Medicine

## 2015-09-03 ENCOUNTER — Ambulatory Visit (HOSPITAL_COMMUNITY)
Admission: RE | Admit: 2015-09-03 | Discharge: 2015-09-03 | Disposition: A | Discharge status: Home / Self Care | Payer: MEDICARE | Source: Ambulatory Visit | Attending: Family Medicine | Admitting: Family Medicine

## 2015-09-03 DIAGNOSIS — I639 Cerebral infarction, unspecified: Secondary | ICD-10-CM

## 2015-09-03 DIAGNOSIS — I6523 Occlusion and stenosis of bilateral carotid arteries: Secondary | ICD-10-CM | POA: Diagnosis not present

## 2015-09-11 ENCOUNTER — Other Ambulatory Visit: Payer: Self-pay | Admitting: Family Medicine

## 2015-09-11 NOTE — Telephone Encounter (Signed)
Refill appropriate and filled per protocol. 

## 2015-09-23 ENCOUNTER — Ambulatory Visit (INDEPENDENT_AMBULATORY_CARE_PROVIDER_SITE_OTHER): Payer: MEDICARE | Admitting: Family Medicine

## 2015-09-23 ENCOUNTER — Encounter: Payer: Self-pay | Admitting: Family Medicine

## 2015-09-23 VITALS — BP 126/80 | HR 60 | Temp 98.7°F | Resp 14 | Wt 159.0 lb

## 2015-09-23 DIAGNOSIS — H3530 Unspecified macular degeneration: Secondary | ICD-10-CM

## 2015-09-23 DIAGNOSIS — I2510 Atherosclerotic heart disease of native coronary artery without angina pectoris: Secondary | ICD-10-CM

## 2015-09-23 DIAGNOSIS — F0150 Vascular dementia without behavioral disturbance: Secondary | ICD-10-CM

## 2015-09-23 DIAGNOSIS — E785 Hyperlipidemia, unspecified: Secondary | ICD-10-CM

## 2015-09-23 DIAGNOSIS — I639 Cerebral infarction, unspecified: Secondary | ICD-10-CM

## 2015-09-23 NOTE — Assessment & Plan Note (Signed)
He has vascular dementia in the setting of multiple strokes with recent one resulting in his slurred speech and dysphagia.  He is legally blind and with his age I do not think that he is of any significant harm and would benefit from removal of the ankle bracelet. He is in the care of his daughter who provides his meals and assist him with his activities of daily living. Continue the Seroquel to help with sundowning and sleep

## 2015-09-23 NOTE — Assessment & Plan Note (Signed)
Recheck cholesterol panel today he is on Lipitor recent sure no significant carotid artery stenosis

## 2015-09-23 NOTE — Progress Notes (Signed)
Patient ID: Alex StackCharles R Williamson, male   DOB: 12/01/1927, 82 y.o.   MRN: [id number]   Subjective:    Patient ID: Alex Stackharles R Williamson, male    DOB: 04/28/1927, 82 y.o.   MRN: [id number]  Patient presents for F/U neck Patient here for follow-up. He is actually a little bit early for his visit as his daughter needed some documentation with regards to his medical problems for his parole officer. There was some confusion therefore I did call his parole officer after the examination her name is Herby Abrahamichole Smith [phone], fax [phone]. Due to his age and his comorbidities they would like to have ankle bracelet removed. This was placed on him when he left [place] and came to [place] about 7 or 8 years ago.He spent 3 years in jail before he left.  His parole officer states that she does need some type of documentation that she can use to work towards getting the ankle bracelet off. Of note this has been denied twice by the state of [place]  In general he is doing fairly okay. They still use thickeners for his liquids we have made a referral to speech therapy he has declined going to therapy at this point. His speech is about the same. He is legally blind as well. There are no new concerns he's taken his medications as prescribed.    Review Of Systems:  GEN- denies fatigue, fever, weight loss,weakness, recent illness HEENT- denies eye drainage, change in vision, nasal discharge, CVS- denies chest pain, palpitations RESP- denies SOB, cough, wheeze ABD- denies N/V, change in stools, abd pain GU- denies dysuria, hematuria, dribbling, incontinence MSK- denies joint pain, muscle aches, injury Neuro- denies headache, dizziness, syncope, seizure activity       Objective:    BP 126/80 mmHg  Pulse 60  Temp(Src) 98.7 F (37.1 C) (Oral)  Resp 14  Wt 159 lb (72.122 kg) GEN- NAD, alert and oriented x3 HEENT- PERRL, EOMI, non injected sclera, pink conjunctiva, MMM, oropharynx clear Neck- Supple, no  bruit CVS- RRR,  2/6 SEM  RESP- CTAB ABD-NABS,soft,NT,ND Neuro-CNII-XIi intact, blind right eye, slurred speech evident, unsteady gait,  EXT- No edema Pulses- Radial  2+       Assessment & Plan:      Problem List Items Addressed This Visit    Vascular dementia    He has vascular dementia in the setting of multiple strokes with recent one resulting in his slurred speech and dysphagia.  He is legally blind and with his age I do not think that he is of any significant harm and would benefit from removal of the ankle bracelet. He is in the care of his daughter who provides his meals and assist him with his activities of daily living. Continue the Seroquel to help with sundowning and sleep      Relevant Medications   QUEtiapine (SEROQUEL) 25 MG tablet   Other Relevant Orders   CBC with Differential/Platelet   Macular degeneration    Legally blind      Hyperlipidemia - Primary    Recheck cholesterol panel today he is on Lipitor recent sure no significant carotid artery stenosis      Relevant Orders   CBC with Differential/Platelet   Comprehensive metabolic panel   Lipid panel   CVA (cerebral vascular accident) [facility](HCC)   Relevant Orders   Comprehensive metabolic panel   Arteriosclerotic cardiovascular disease (ASCVD)      Note: This dictation was prepared with Dragon dictation along with  smaller phrase technology. Any transcriptional errors that result from this process are unintentional.

## 2015-09-23 NOTE — Assessment & Plan Note (Signed)
Legally blind 

## 2015-09-23 NOTE — Patient Instructions (Signed)
F/U 4 months  

## 2015-09-24 LAB — CBC WITH DIFFERENTIAL/PLATELET
BASOS ABS: 0 {cells}/uL (ref 0–200)
Basophils Relative: 0 %
EOS ABS: 192 {cells}/uL (ref 15–500)
Eosinophils Relative: 3 %
HEMATOCRIT: 37.8 % — AB (ref 38.5–50.0)
Hemoglobin: 12.4 g/dL — ABNORMAL LOW (ref 13.0–17.0)
Lymphocytes Relative: 33 %
Lymphs Abs: 2112 cells/uL (ref 850–3900)
MCH: 30.2 pg (ref 27.0–33.0)
MCHC: 32.8 g/dL (ref 32.0–36.0)
MCV: 92.2 fL (ref 80.0–100.0)
MONO ABS: 640 {cells}/uL (ref 200–950)
MONOS PCT: 10 %
MPV: 10.9 fL (ref 7.5–12.5)
NEUTROS PCT: 54 %
Neutro Abs: 3456 cells/uL (ref 1500–7800)
PLATELETS: 181 10*3/uL (ref 140–400)
RBC: 4.1 MIL/uL — ABNORMAL LOW (ref 4.20–5.80)
RDW: 13.8 % (ref 11.0–15.0)
WBC: 6.4 10*3/uL (ref 3.8–10.8)

## 2015-09-24 LAB — COMPREHENSIVE METABOLIC PANEL
ALBUMIN: 3.7 g/dL (ref 3.6–5.1)
ALT: 9 U/L (ref 9–46)
AST: 13 U/L (ref 10–35)
Alkaline Phosphatase: 58 U/L (ref 40–115)
BUN: 25 mg/dL (ref 7–25)
CHLORIDE: 107 mmol/L (ref 98–110)
CO2: 23 mmol/L (ref 20–31)
Calcium: 9.3 mg/dL (ref 8.6–10.3)
Creat: 1.62 mg/dL — ABNORMAL HIGH (ref 0.70–1.11)
Glucose, Bld: 114 mg/dL — ABNORMAL HIGH (ref 70–99)
POTASSIUM: 4.4 mmol/L (ref 3.5–5.3)
SODIUM: 142 mmol/L (ref 135–146)
TOTAL PROTEIN: 6.5 g/dL (ref 6.1–8.1)
Total Bilirubin: 0.5 mg/dL (ref 0.2–1.2)

## 2015-09-24 LAB — LIPID PANEL
CHOL/HDL RATIO: 3.1 ratio (ref <=5.0)
CHOLESTEROL: 148 mg/dL (ref 125–200)
HDL: 47 mg/dL (ref >=40)
LDL CALC: 64 mg/dL (ref <130)
TRIGLYCERIDES: 187 mg/dL — AB (ref <150)
VLDL: 37 mg/dL — AB (ref <30)

## 2015-09-25 ENCOUNTER — Encounter: Payer: Self-pay | Admitting: Family Medicine

## 2015-09-29 ENCOUNTER — Telehealth: Payer: Self-pay | Admitting: Family Medicine

## 2015-09-29 NOTE — Telephone Encounter (Signed)
Call placed to [facility]. Requested to have letter e-mailed.   Documentation sent to parole office.

## 2015-09-29 NOTE — Telephone Encounter (Signed)
Call back to get new fax numbers

## 2015-09-29 NOTE — Telephone Encounter (Signed)
Alex Williamson PATIENTS PAROLE OFFICER IS CALLING YOU REGARDING THIS PATIENT  [phone]

## 2015-10-13 ENCOUNTER — Other Ambulatory Visit: Payer: Self-pay | Admitting: Family Medicine

## 2015-10-13 NOTE — Telephone Encounter (Signed)
Refill appropriate and filled per protocol. 

## 2015-10-15 ENCOUNTER — Ambulatory Visit (HOSPITAL_COMMUNITY): Payer: MEDICARE | Admitting: Psychology

## 2015-11-02 ENCOUNTER — Other Ambulatory Visit: Payer: Self-pay | Admitting: Family Medicine

## 2015-11-03 ENCOUNTER — Ambulatory Visit (HOSPITAL_COMMUNITY): Payer: MEDICARE | Admitting: Psychology

## 2015-11-13 ENCOUNTER — Other Ambulatory Visit: Payer: Self-pay | Admitting: Family Medicine

## 2015-11-27 ENCOUNTER — Ambulatory Visit (HOSPITAL_COMMUNITY): Payer: MEDICARE | Admitting: Psychology

## 2015-11-30 ENCOUNTER — Telehealth (HOSPITAL_COMMUNITY): Payer: Self-pay | Admitting: *Deleted

## 2015-11-30 NOTE — Telephone Encounter (Signed)
Alex PouchSusan Williamson called stating she would like for Dr. Kieth Brightlyodenbough to please call her. Per [name], she would like to know where Dr. Kieth Brightlyodenbough would be sending the letter he promised for pt. Is he going to be sending it via e-mail or mail? Per [name].

## 2015-12-07 ENCOUNTER — Other Ambulatory Visit: Payer: Self-pay | Admitting: Family Medicine

## 2015-12-10 ENCOUNTER — Telehealth (HOSPITAL_COMMUNITY): Payer: Self-pay | Admitting: *Deleted

## 2015-12-10 NOTE — Telephone Encounter (Signed)
patient's daughter [name], said she is returning your phone call regarding testing results.

## 2016-01-06 ENCOUNTER — Other Ambulatory Visit: Payer: Self-pay | Admitting: Family Medicine

## 2016-01-13 ENCOUNTER — Ambulatory Visit (INDEPENDENT_AMBULATORY_CARE_PROVIDER_SITE_OTHER): Payer: MEDICARE | Admitting: Family Medicine

## 2016-01-13 DIAGNOSIS — Z111 Encounter for screening for respiratory tuberculosis: Secondary | ICD-10-CM | POA: Diagnosis not present

## 2016-01-15 ENCOUNTER — Ambulatory Visit: Payer: MEDICARE

## 2016-01-15 DIAGNOSIS — Z111 Encounter for screening for respiratory tuberculosis: Secondary | ICD-10-CM

## 2016-01-15 LAB — TB SKIN TEST
Induration: 0 mm
TB Skin Test: NEGATIVE

## 2016-01-18 ENCOUNTER — Telehealth: Payer: Self-pay | Admitting: *Deleted

## 2016-01-18 ENCOUNTER — Encounter: Payer: Self-pay | Admitting: *Deleted

## 2016-01-18 NOTE — Telephone Encounter (Signed)
Received call from Lupita Leashonna with [facility]. [phone]~ telephone/ [phone]~ fax.  Reports that patient is being admitted to facility today. Requires documentation stating that FL2 from 01/15/2016 is valid and up to date.   Letter printed to be faxed to facility.

## 2016-01-18 NOTE — Telephone Encounter (Signed)
Agree with above, no changes I am aware of to condition

## 2016-01-27 ENCOUNTER — Other Ambulatory Visit: Payer: Self-pay | Admitting: Family Medicine

## 2016-02-02 ENCOUNTER — Ambulatory Visit (INDEPENDENT_AMBULATORY_CARE_PROVIDER_SITE_OTHER): Payer: MEDICARE

## 2016-02-02 ENCOUNTER — Telehealth: Payer: Self-pay | Admitting: Family Medicine

## 2016-02-02 DIAGNOSIS — Z111 Encounter for screening for respiratory tuberculosis: Secondary | ICD-10-CM

## 2016-02-02 NOTE — Telephone Encounter (Signed)
Daughter dropped off paperwork from [facility] I have placed in Christina's folder.

## 2016-02-02 NOTE — Telephone Encounter (Signed)
Awaiting forms

## 2016-02-04 ENCOUNTER — Other Ambulatory Visit: Payer: Self-pay | Admitting: *Deleted

## 2016-02-04 ENCOUNTER — Encounter: Payer: Self-pay | Admitting: *Deleted

## 2016-02-04 ENCOUNTER — Ambulatory Visit: Payer: MEDICARE | Admitting: *Deleted

## 2016-02-04 DIAGNOSIS — Z111 Encounter for screening for respiratory tuberculosis: Secondary | ICD-10-CM

## 2016-02-04 LAB — TB SKIN TEST
INDURATION: 0 mm
TB SKIN TEST: NEGATIVE

## 2016-02-04 MED ORDER — DAILY VITES PO TABS: 1.0000 | ORAL_TABLET | Freq: Every day | ORAL | 11 refills | Status: DC

## 2016-02-04 NOTE — Telephone Encounter (Signed)
Forms placed directly in MD folder via front office staff.   Forms included PCS and standing orders for ALF.

## 2016-02-04 NOTE — Progress Notes (Signed)
Patient ID: [name], male   DOB: 03/31/1927, 82 y.o.   MRN: [id number]  Patient in office to have PPD read.   No redness or induration noted.   Read as negative.

## 2016-02-16 ENCOUNTER — Other Ambulatory Visit: Payer: Self-pay | Admitting: *Deleted

## 2016-02-16 MED ORDER — TRIAMCINOLONE ACETONIDE 0.1 % EX CREA: 1.0000 "application " | TOPICAL_CREAM | Freq: Two times a day (BID) | CUTANEOUS | 0 refills | Status: AC

## 2016-02-16 NOTE — Telephone Encounter (Signed)
Received fax from pharmacy requesting indication on Kenalog.   Prescription sent to pharmacy with indication added.

## 2016-03-02 ENCOUNTER — Encounter: Payer: Self-pay | Admitting: Family Medicine

## 2016-03-02 ENCOUNTER — Ambulatory Visit (INDEPENDENT_AMBULATORY_CARE_PROVIDER_SITE_OTHER): Payer: MEDICARE | Admitting: Family Medicine

## 2016-03-02 ENCOUNTER — Ambulatory Visit: Payer: MEDICARE | Admitting: Physician Assistant

## 2016-03-02 VITALS — BP 124/72 | HR 61 | Temp 97.7°F | Ht 71.0 in | Wt 157.0 lb

## 2016-03-02 DIAGNOSIS — K5901 Slow transit constipation: Secondary | ICD-10-CM

## 2016-03-02 DIAGNOSIS — H3530 Unspecified macular degeneration: Secondary | ICD-10-CM

## 2016-03-02 DIAGNOSIS — I10 Essential (primary) hypertension: Secondary | ICD-10-CM

## 2016-03-02 DIAGNOSIS — I2510 Atherosclerotic heart disease of native coronary artery without angina pectoris: Secondary | ICD-10-CM

## 2016-03-02 DIAGNOSIS — F0150 Vascular dementia without behavioral disturbance: Secondary | ICD-10-CM

## 2016-03-02 DIAGNOSIS — N3941 Urge incontinence: Secondary | ICD-10-CM

## 2016-03-02 DIAGNOSIS — I639 Cerebral infarction, unspecified: Secondary | ICD-10-CM

## 2016-03-02 NOTE — Assessment & Plan Note (Signed)
He has been vascular dementia in the setting of urinary incontinence he is legally blind. He is currently stable he did have a recent stroke which she has not had any further decompensation . He seems to have a good appetite at this time. His medications are doing well to control his risk factors. He had recent labs. No changes today. Discussed that he does have constipation medicine on hand if needed this is lactulose.

## 2016-03-02 NOTE — Assessment & Plan Note (Signed)
Controlled no changes 

## 2016-03-02 NOTE — Progress Notes (Signed)
   Subjective:    Patient ID: Alex Williamson, male    DOB: 02/22/1928, 82 y.o.   MRN: [id number]  Patient presents for Follow-up Patient for follow-up on his routine medical care. He is currently in an assisted living facility and requires also visited to evaluate his medications and current state. He has not had any changes since her last visit he's been doing fairly well at the assisted living for the past few weeks. He is here today with his daughter. His medications were reviewed there've been no recent changes. He does not have any new concerns today. He did receive his flu shot at the local pharmacy    Review Of Systems:  GEN- denies fatigue, fever, weight loss,weakness, recent illness HEENT- denies eye drainage, change in vision, nasal discharge, CVS- denies chest pain, palpitations RESP- denies SOB, cough, wheeze ABD- denies N/V, change in stools, abd pain GU- denies dysuria, hematuria, dribbling, incontinence MSK- denies joint pain, muscle aches, injury Neuro- denies headache, dizziness, syncope, seizure activity       Objective:    BP 124/72 (BP Location: Left Arm, Patient Position: Sitting, Cuff Size: Normal)   Pulse 61   Temp 97.7 F (36.5 C) (Oral)   Ht 5\' 11"  (1.803 m)   Wt 157 lb (71.2 kg)   SpO2 98%   BMI 21.90 kg/m  GEN- NAD, alert and oriented x3 HEENT- PERRL, EOMI, non injected sclera, pink conjunctiva, MMM, oropharynx clear Neck- Supple, no bruit CVS- RRR,  2/6 SEM  RESP- CTAB ABD-NABS,soft,NT,ND Neuro-CNII-XIi intact, blind right eye, slurred speech evident, unsteady gait,  EXT- No edema Pulses- Radial  2+        Assessment & Plan:      Problem List Items Addressed This Visit    Vascular dementia    He has been vascular dementia in the setting of urinary incontinence he is legally blind. He is currently stable he did have a recent stroke which she has not had any further decompensation . He seems to have a good appetite at this time. His  medications are doing well to control his risk factors. He had recent labs. No changes today. Discussed that he does have constipation medicine on hand if needed this is lactulose.      Urinary incontinence - Primary   Macular degeneration   Essential hypertension    Controlled no changes      CVA (cerebral vascular accident) [facility](HCC)   Constipation      Note: This dictation was prepared with Dragon dictation along with smaller phrase technology. Any transcriptional errors that result from this process are unintentional.

## 2016-03-02 NOTE — Patient Instructions (Signed)
F/U 6 months

## 2016-03-21 ENCOUNTER — Telehealth: Payer: Self-pay | Admitting: Family Medicine

## 2016-03-21 MED ORDER — LORATADINE 5 MG PO CHEW
5.0000 mg | CHEWABLE_TABLET | Freq: Every day | ORAL | 0 refills | Status: DC
Start: 2016-03-21 — End: 2017-02-27

## 2016-03-21 MED ORDER — GUAIFENESIN-DM 100-10 MG/5ML PO SYRP: 5.0000 mL | ORAL_SOLUTION | ORAL | 0 refills | Status: DC | PRN

## 2016-03-21 NOTE — Telephone Encounter (Signed)
[facility].   Reports that patient has x3 days of hoarseness, nasal drainage with clear mucus, and non-productive cough.   MD please advise.

## 2016-03-21 NOTE — Telephone Encounter (Signed)
Prescription sent to pharmacy.   Order faxed to [facility].

## 2016-03-21 NOTE — Telephone Encounter (Signed)
Lupita Le[facility]Donna from [facility] calling to see if something could be called in to rx care for patient, he is very congested but not fever

## 2016-03-21 NOTE — Telephone Encounter (Signed)
Robitussin DM prn cough Send in claritin 5mg  po daily for 2 weeks

## 2016-08-09 ENCOUNTER — Other Ambulatory Visit: Payer: Self-pay | Admitting: Family Medicine

## 2016-08-15 ENCOUNTER — Telehealth: Payer: Self-pay | Admitting: *Deleted

## 2016-08-15 NOTE — Telephone Encounter (Signed)
Received call from Pete, [facility] with [facility].   Requested to extend PT services 2x2 weeks, and 1x3 weeks for gait, home safety and strengthening. Also requested VO for SN/ OT to eval and treat as indicated.  Verbal orders given.   Reports that BP noted elevated in setting. States that BP 156/ 78 at rest, and 172/84 with activity.   Also reports that interaction noted between Plavix and Omeprazole. Ok to change to Protonix?  MD please advise.

## 2016-08-15 NOTE — Telephone Encounter (Signed)
They can check his BP at SNF three times a week and send it to me  He has been on plavix and omeprazole with no difficulties for many years, has tried other meds, no changes

## 2016-08-15 NOTE — Telephone Encounter (Signed)
Received call from [place], [facility] [phone].  Reports that he was not able to see patient over the weekend to eval and requested VO to change start of care to today.   VO given.

## 2016-08-15 NOTE — Telephone Encounter (Signed)
Agree with above 

## 2016-08-16 NOTE — Telephone Encounter (Signed)
Order faxed to facility at [phone]~ telephone/ [phone]~ fax

## 2016-08-18 ENCOUNTER — Telehealth: Payer: Self-pay | Admitting: *Deleted

## 2016-08-18 NOTE — Telephone Encounter (Signed)
Received call from [place], [facility].   Requested VO to extend SN services 1x1 week, 2x1 week, 1x3 weeks for BP monitoring. States that patient VS have all been WNL. VO given.

## 2016-08-19 NOTE — Telephone Encounter (Signed)
Agree with above 

## 2016-09-12 ENCOUNTER — Encounter: Payer: Self-pay | Admitting: Family Medicine

## 2016-09-12 ENCOUNTER — Ambulatory Visit (HOSPITAL_COMMUNITY)
Admission: RE | Admit: 2016-09-12 | Discharge: 2016-09-12 | Disposition: A | Discharge status: Home / Self Care | Payer: MEDICARE | Source: Ambulatory Visit | Attending: Family Medicine | Admitting: Family Medicine

## 2016-09-12 ENCOUNTER — Ambulatory Visit (INDEPENDENT_AMBULATORY_CARE_PROVIDER_SITE_OTHER): Payer: MEDICARE | Admitting: Family Medicine

## 2016-09-12 VITALS — BP 150/80 | HR 64 | Temp 98.2°F | Resp 14 | Ht 71.0 in | Wt 146.0 lb

## 2016-09-12 DIAGNOSIS — M79605 Pain in left leg: Secondary | ICD-10-CM | POA: Insufficient documentation

## 2016-09-12 DIAGNOSIS — M545 Low back pain, unspecified: Secondary | ICD-10-CM

## 2016-09-12 DIAGNOSIS — I700 Atherosclerosis of aorta: Secondary | ICD-10-CM | POA: Diagnosis not present

## 2016-09-12 LAB — URINALYSIS, ROUTINE W REFLEX MICROSCOPIC
BILIRUBIN URINE: NEGATIVE
GLUCOSE, UA: NEGATIVE
HGB URINE DIPSTICK: NEGATIVE
KETONES UR: NEGATIVE
LEUKOCYTES UA: NEGATIVE
Nitrite: NEGATIVE
Protein, ur: NEGATIVE
Specific Gravity, Urine: 1.015 (ref 1.001–1.035)
pH: 5.5 (ref 5.0–8.0)

## 2016-09-12 MED ORDER — TRAMADOL HCL 50 MG PO TABS: 50.0000 mg | ORAL_TABLET | Freq: Three times a day (TID) | ORAL | 0 refills | Status: DC | PRN

## 2016-09-12 NOTE — Progress Notes (Signed)
Subjective:    Patient ID: Alex Williamson, male    DOB: [birth year], 83 y.o.   MRN: [id number]  HPI  Patient is an 83 year old man with a history of vascular dementia and is currently on Plavix for secondary stroke prevention. He reports a 10-11 day history of right-sided low back pain. He states that it started after a prolonged period sitting in a car. This occurred 10 days ago. His back has never felt right after that period of time. Pain is located in the lower right side just above the pelvis. He is tender to palpation in the right-sided paraspinal muscles. He has no CVA tenderness. He denies any dysuria or hematuria although he does report increased urinary frequency and urgency continence. He denies any radiation of the pain down his right leg. He denies any weakness or numbness in his right leg. He is having some pain in his left shin with ambulation but this just began a few days ago. He is concerned that he may have a bladder infection causing the right-sided low back pain. Based on his history and his exam however I suspect this is a muscle strain Past Medical History:  Diagnosis Date  . Arteriosclerotic cardiovascular disease (ASCVD) 1982   Acute MI in 1982; returned with non-ST segment elevation MI in 07/2010; emergency CABG-Dr. Laneta Simmers  . Cerebrovascular disease 2005   left parietal CVA  . Chronic kidney disease, stage 2, mildly decreased GFR   . GERD (gastroesophageal reflux disease)   . Hyperlipidemia   . Hypertension   . Macular degeneration    + cataract; legally blind  . Stroke [facility])    2nd stroke in May 2017  . Vascular dementia    Past Surgical History:  Procedure Laterality Date  . CARDIAC SURGERY    . CORONARY ARTERY BYPASS GRAFT    . ESOPHAGOGASTRODUODENOSCOPY  09/20/11   schatzki's ring/hiatal hernia/antal erosion. bx showed gastritis but no h.pylori  . heart bypass  08/2010   [facility]  . HERNIA REPAIR    . MALONEY DILATION  09/20/2011   Procedure: Elease Hashimoto  DILATION;  Surgeon: Corbin Ade, MD;  [facility];  Service: Endoscopy;  Laterality: N/A;  . SAVORY DILATION  09/20/2011   Procedure: SAVORY DILATION;  Surgeon: Corbin Ade, MD;  Location: [facility];  Service: Endoscopy;  Laterality: N/A;  . TONSILLECTOMY     Current Outpatient Prescriptions on File Prior to Visit  Medication Sig Dispense Refill  . atorvastatin (LIPITOR) 20 MG tablet TAKE 1 TABLET BY MOUTH AT BEDTIME. 30 tablet 0  . carvedilol (COREG) 3.125 MG tablet TAKE 1 TABLET BY MOUTH TWICE DAILY. 60 tablet 0  . clopidogrel (PLAVIX) 75 MG tablet TAKE 1 TABLET BY MOUTH AT BEDTIME. 30 tablet 0  . Lactulose 20 GM/30ML SOLN Give 30ml po daily for constipation as needed (Patient taking differently: Take 15 mLs by mouth daily. Give 30ml po daily for constipation as needed) 1 Bottle 6  . loratadine (CLARITIN) 5 MG chewable tablet Chew 1 tablet (5 mg total) by mouth daily. X 2 weeks, then D/C. 14 tablet 0  . Multiple Vitamin (DAILY VITES) tablet TAKE 1 TABLET BY MOUTH ONCE DAILY. 30 tablet 0  . Multiple Vitamin (MULTIVITAMIN WITH MINERALS) TABS tablet Take 1 tablet by mouth daily.    Marland Kitchen omeprazole (PRILOSEC) 20 MG capsule TAKE 1 CAPSULE BY MOUTH EACH MORNING. 30 capsule 0  . oxybutynin (DITROPAN) 5 MG tablet TAKE (1) TABLET BY MOUTH TWICE DAILY.  180 tablet 0  . QUEtiapine (SEROQUEL) 25 MG tablet TAKE 1 TABLET BY MOUTH AT BEDTIME. 30 tablet 0  . ranitidine (ZANTAC) 150 MG tablet TAKE 1 TABLET BY MOUTH AT BEDTIME. 30 tablet 0  . triamcinolone cream (KENALOG) 0.1 % Apply 1 application topically 2 (two) times daily. Skin irritation 30 g 0  . [DISCONTINUED] oxybutynin (DITROPAN) 5 MG tablet Take 1 tablet (5 mg total) by mouth 2 (two) times daily. 60 tablet 2   No current facility-administered medications on file prior to visit.    Allergies  Allergen Reactions  . Remeron [Mirtazapine] Other (See Comments)    "feels Loopy"   Social History   Social History  . Marital status:  Widowed    Spouse name: N/A  . Number of children: 8  . Years of education: N/A   Occupational History  . retired-worked underground [employer]    .  Retired   Social History Main Topics  . Smoking status: Never Smoker  . Smokeless tobacco: Never Used  . Alcohol use No  . Drug use: No  . Sexual activity: Not on file   Other Topics Concern  . Not on file   Social History Narrative   Lives w/ daughter.  Moved from [place].   4 biological children   4 adopted children     Review of Systems  All other systems reviewed and are negative.      Objective:   Physical Exam  Constitutional: He appears well-developed and well-nourished.  Cardiovascular: Normal rate, regular rhythm and normal heart sounds.   Pulmonary/Chest: Effort normal and breath sounds normal. No respiratory distress. He has no wheezes. He has no rales.  Musculoskeletal:       Lumbar back: He exhibits decreased range of motion, tenderness and pain. He exhibits no bony tenderness, no swelling, no deformity and no spasm.       Back:       Left lower leg: He exhibits tenderness.       Legs: Vitals reviewed.         Assessment & Plan:  Strain of tendon of lower back  Acute right-sided low back pain without sciatica - Plan: Urinalysis, Routine w reflex microscopic  I believe the pain in his lower back represents a muscle strain. I do not believe he has a urinary tract infection. Given his advanced age however I will check a urinalysis to be cautious. My biggest concern is how we want to treat this. He cannot take NSAIDs because of his use of Plavix. I do not want to place him on a muscle relaxer or any type of pain medication to cause of his vascular dementia which could be exacerbated by these agents. Therefore I want to be conservative. I will give the patient tramadol 50 mg by mouth every 6 hours when necessary severe pain in his back. I anticipate that if this is a muscle, it should be better over  the next 1-2 weeks. I will obtain x-rays of the lower back and of the left shin.

## 2016-10-18 ENCOUNTER — Other Ambulatory Visit: Payer: Self-pay | Admitting: Family Medicine

## 2016-10-27 ENCOUNTER — Telehealth: Payer: Self-pay | Admitting: Family Medicine

## 2016-10-27 NOTE — Telephone Encounter (Signed)
New Message  Erie Noe from [facility] needing office notes faxed to [phone]. Attn to [place].

## 2016-10-27 NOTE — Telephone Encounter (Signed)
Call placed to [facility] [phone]~ telephone to inquire as to which notes are being requested. LMTRC.

## 2016-10-31 NOTE — Telephone Encounter (Signed)
Multiple calls placed to HH with no answer and no return call.   Message to be closed.  

## 2016-11-14 ENCOUNTER — Other Ambulatory Visit: Payer: Self-pay | Admitting: Family Medicine

## 2016-12-20 ENCOUNTER — Other Ambulatory Visit: Payer: Self-pay | Admitting: Family Medicine

## 2017-01-09 ENCOUNTER — Telehealth: Payer: Self-pay | Admitting: *Deleted

## 2017-01-09 NOTE — Telephone Encounter (Signed)
Received fax requesting order to D/C Tramadol D/T >90 days non-use. MD reviewed and approved. Order faxed to facility.

## 2017-02-27 ENCOUNTER — Emergency Department (HOSPITAL_COMMUNITY): Payer: MEDICARE

## 2017-02-27 ENCOUNTER — Other Ambulatory Visit: Payer: Self-pay

## 2017-02-27 ENCOUNTER — Encounter (HOSPITAL_COMMUNITY): Payer: Self-pay | Admitting: Emergency Medicine

## 2017-02-27 ENCOUNTER — Emergency Department (HOSPITAL_COMMUNITY)
Admission: EM | Admit: 2017-02-27 | Discharge: 2017-02-27 | Disposition: A | Discharge status: Home / Self Care | Payer: MEDICARE | Attending: Emergency Medicine | Admitting: Emergency Medicine

## 2017-02-27 DIAGNOSIS — D649 Anemia, unspecified: Secondary | ICD-10-CM

## 2017-02-27 DIAGNOSIS — R410 Disorientation, unspecified: Secondary | ICD-10-CM

## 2017-02-27 DIAGNOSIS — Z7902 Long term (current) use of antithrombotics/antiplatelets: Secondary | ICD-10-CM | POA: Diagnosis not present

## 2017-02-27 DIAGNOSIS — N182 Chronic kidney disease, stage 2 (mild): Secondary | ICD-10-CM | POA: Diagnosis not present

## 2017-02-27 DIAGNOSIS — Z8673 Personal history of transient ischemic attack (TIA), and cerebral infarction without residual deficits: Secondary | ICD-10-CM | POA: Diagnosis not present

## 2017-02-27 DIAGNOSIS — I129 Hypertensive chronic kidney disease with stage 1 through stage 4 chronic kidney disease, or unspecified chronic kidney disease: Secondary | ICD-10-CM | POA: Diagnosis not present

## 2017-02-27 HISTORY — DX: Anxiety disorder, unspecified: F41.9

## 2017-02-27 HISTORY — DX: Benign prostatic hyperplasia without lower urinary tract symptoms: N40.0

## 2017-02-27 HISTORY — DX: Major depressive disorder, single episode, unspecified: F32.9

## 2017-02-27 HISTORY — DX: Dysphagia, unspecified: R13.10

## 2017-02-27 HISTORY — DX: Depression, unspecified: F32.A

## 2017-02-27 LAB — URINALYSIS, ROUTINE W REFLEX MICROSCOPIC
Bilirubin Urine: NEGATIVE
Glucose, UA: NEGATIVE mg/dL
Hgb urine dipstick: NEGATIVE
Ketones, ur: NEGATIVE mg/dL
Leukocytes, UA: NEGATIVE
NITRITE: NEGATIVE
Protein, ur: NEGATIVE mg/dL
SPECIFIC GRAVITY, URINE: 1.025 (ref 1.005–1.030)
pH: 5 (ref 5.0–8.0)

## 2017-02-27 LAB — COMPREHENSIVE METABOLIC PANEL
ALK PHOS: 58 U/L (ref 38–126)
ALT: 25 U/L (ref 17–63)
ANION GAP: 11 (ref 5–15)
AST: 21 U/L (ref 15–41)
Albumin: 2.8 g/dL — ABNORMAL LOW (ref 3.5–5.0)
BUN: 25 mg/dL — ABNORMAL HIGH (ref 6–20)
CALCIUM: 9.4 mg/dL (ref 8.9–10.3)
CHLORIDE: 104 mmol/L (ref 101–111)
CO2: 23 mmol/L (ref 22–32)
Creatinine, Ser: 1.33 mg/dL — ABNORMAL HIGH (ref 0.61–1.24)
GFR, EST AFRICAN AMERICAN: 54 mL/min — AB (ref >60)
GFR, EST NON AFRICAN AMERICAN: 46 mL/min — AB (ref >60)
Glucose, Bld: 106 mg/dL — ABNORMAL HIGH (ref 65–99)
Potassium: 4.2 mmol/L (ref 3.5–5.1)
SODIUM: 138 mmol/L (ref 135–145)
Total Bilirubin: 0.5 mg/dL (ref 0.3–1.2)
Total Protein: 6.4 g/dL — ABNORMAL LOW (ref 6.5–8.1)

## 2017-02-27 LAB — CBC WITH DIFFERENTIAL/PLATELET
BASOS ABS: 0 10*3/uL (ref 0.0–0.1)
BASOS PCT: 0 %
EOS ABS: 0.2 10*3/uL (ref 0.0–0.7)
EOS PCT: 3 %
HEMATOCRIT: 33.4 % — AB (ref 39.0–52.0)
Hemoglobin: 10.7 g/dL — ABNORMAL LOW (ref 13.0–17.0)
Lymphocytes Relative: 23 %
Lymphs Abs: 1.5 10*3/uL (ref 0.7–4.0)
MCH: 29.6 pg (ref 26.0–34.0)
MCHC: 32 g/dL (ref 30.0–36.0)
MCV: 92.3 fL (ref 78.0–100.0)
MONO ABS: 1 10*3/uL (ref 0.1–1.0)
Monocytes Relative: 15 %
NEUTROS ABS: 3.9 10*3/uL (ref 1.7–7.7)
Neutrophils Relative %: 59 %
PLATELETS: 244 10*3/uL (ref 150–400)
RBC: 3.62 MIL/uL — ABNORMAL LOW (ref 4.22–5.81)
RDW: 12.5 % (ref 11.5–15.5)
WBC: 6.7 10*3/uL (ref 4.0–10.5)

## 2017-02-27 LAB — LACTIC ACID, PLASMA
LACTIC ACID, VENOUS: 1.2 mmol/L (ref 0.5–1.9)
LACTIC ACID, VENOUS: 1.1 mmol/L (ref 0.5–1.9)

## 2017-02-27 LAB — POC OCCULT BLOOD, ED: FECAL OCCULT BLD: NEGATIVE

## 2017-02-27 LAB — CBG MONITORING, ED: Glucose-Capillary: 109 mg/dL — ABNORMAL HIGH (ref 65–99)

## 2017-02-27 LAB — TROPONIN I

## 2017-02-27 MED ORDER — SODIUM CHLORIDE 0.9 % IV BOLUS (SEPSIS)
1000.0000 mL | Freq: Once | INTRAVENOUS | Status: AC
  Administered 2017-02-27: 1000 mL via INTRAVENOUS

## 2017-02-27 NOTE — ED Triage Notes (Signed)
Pt reports left hip pain and bilateral hand pain at 2 am this am. Pt denies pain at present. Pt alert to self, situation, and place. Pt has history of dementia. Per facility pt brought over for evaluation of altered mental status/possible UTI.

## 2017-02-27 NOTE — ED Provider Notes (Signed)
[facility] EMERGENCY DEPARTMENT Provider Note   CSN: [id number] Arrival date & time: 02/27/17  1038     History   Chief Complaint Chief Complaint  Patient presents with  . Extremity Pain    HPI Alex Williamson is a 83 y.o. male with a hx of CVA, anxiety, CKD2, GERD, hyperlipidemia, HTN, and vascular dementia who is sent to the ED from assisted living facility due to concern for altered mental status this morning.  When asking the patient what brings him to the emergency department he states that he had pain in his bilateral hands and his left hip this morning.  States that this pain has resolved, he has no complaints at this time.  Per discussion with nursing home this morning patient was confused, was not getting out of bed, and was not ambulating.  This is very abnormal from his baseline therefore they sent him to the emergency department for evaluation. When asked about this patient states he woke up in pain and did not want to get up. Patient is alert and oriented at this time.  Patient denies fever, chills, chest pain, palpitations, dizziness, lightheadedness, cough, dyspnea, dysuria, frequency, headache, change in vision, numbness, or weakness.  HPI  Past Medical History:  Diagnosis Date  . Anxiety   . Arteriosclerotic cardiovascular disease (ASCVD) 1982   Acute MI in 1982; returned with non-ST segment elevation MI in 07/2010; emergency CABG-Dr. Laneta Simmers  . Benign prostate hyperplasia   . Cerebrovascular disease 2005   left parietal CVA  . Chronic kidney disease, stage 2, mildly decreased GFR   . Depression   . Dysphagia   . GERD (gastroesophageal reflux disease)   . Hyperlipidemia   . Hypertension   . Macular degeneration    + cataract; legally blind  . Stroke [facility])    2nd stroke in May 2017  . Vascular dementia     Patient Active Problem List   Diagnosis Date Noted  . CVA (cerebral vascular accident) (HCC) 07/10/2015  . Dementia with behavioral disturbance  05/15/2015  . Dry mouth 10/06/2011  . Cough 09/28/2011  . Encounter for screening colonoscopy 09/07/2011  . Hallucination 06/28/2011  . Ingrown toenail 06/28/2011  . Urinary incontinence 05/02/2011  . Constipation 05/02/2011  . Dysphagia 03/16/2011  . GAD (generalized anxiety disorder) 02/09/2011  . Depression 02/09/2011  . Abnormal sputum amount 12/19/2010  . GERD (gastroesophageal reflux disease)   . Cerebrovascular disease   . Macular degeneration   . Arteriosclerotic cardiovascular disease (ASCVD)   . Chronic kidney disease, stage 2, mildly decreased GFR   . Vascular dementia 02/06/2008  . Hyperlipidemia 11/01/2007  . Essential hypertension 11/01/2007  . BENIGN PROSTATIC HYPERTROPHY 11/01/2007    Past Surgical History:  Procedure Laterality Date  . CARDIAC SURGERY    . CORONARY ARTERY BYPASS GRAFT    . ESOPHAGOGASTRODUODENOSCOPY  09/20/11   schatzki's ring/hiatal hernia/antal erosion. bx showed gastritis but no h.pylori  . heart bypass  08/2010   [facility]  . HERNIA REPAIR    . MALONEY DILATION  09/20/2011   Procedure: Elease Hashimoto DILATION;  Surgeon: Corbin Ade, MD;  Location: [facility];  Service: Endoscopy;  Laterality: N/A;  . SAVORY DILATION  09/20/2011   Procedure: SAVORY DILATION;  Surgeon: Corbin Ade, MD;  Location: [facility];  Service: Endoscopy;  Laterality: N/A;  . TONSILLECTOMY         Home Medications    Prior to Admission medications   Medication Sig Start Date End  Date Taking? Authorizing Provider  atorvastatin (LIPITOR) 20 MG tablet TAKE 1 TABLET BY MOUTH AT BEDTIME. 08/09/16   Tatum, Velna HatchetKawanta F, MD  carvedilol (COREG) 3.125 MG tablet TAKE 1 TABLET BY MOUTH TWICE DAILY. 08/09/16   Salley Scarleturham, Kawanta F, MD  clopidogrel (PLAVIX) 75 MG tablet TAKE 1 TABLET BY MOUTH AT BEDTIME. 08/09/16   [place], Velna HatchetKawanta F, MD  Lactulose 20 GM/30ML SOLN Give 30ml po daily for constipation as needed Patient taking differently: Take 15 mLs by mouth daily. Give 30ml po  daily for constipation as needed 08/20/14   Salley Scarleturham, Kawanta F, MD  loratadine (CLARITIN) 5 MG chewable tablet Chew 1 tablet (5 mg total) by mouth daily. X 2 weeks, then D/C. 03/21/16   [place], Velna HatchetKawanta F, MD  Multiple Vitamin (DAILY VITES) tablet TAKE 1 TABLET BY MOUTH ONCE DAILY. 08/09/16   Salley Scarleturham, Kawanta F, MD  Multiple Vitamin (MULTIVITAMIN WITH MINERALS) TABS tablet Take 1 tablet by mouth daily.    [provider]  omeprazole (PRILOSEC) 20 MG capsule TAKE 1 CAPSULE BY MOUTH EACH MORNING. 08/09/16   [place], Velna HatchetKawanta F, MD  oxybutynin (DITROPAN) 5 MG tablet TAKE 1 TABLET BY MOUTH TWICE DAILY. 12/20/16   Salley Scarleturham, Kawanta F, MD  QUEtiapine (SEROQUEL) 25 MG tablet TAKE 1 TABLET BY MOUTH AT BEDTIME. 08/09/16   Salley Scarleturham, Kawanta F, MD  ranitidine (ZANTAC) 150 MG tablet TAKE 1 TABLET BY MOUTH AT BEDTIME. 08/09/16   [place]wolf, Velna HatchetKawanta F, MD  triamcinolone cream (KENALOG) 0.1 % Apply 1 application topically 2 (two) times daily. Skin irritation 02/16/16   Salley Scarleturham, Kawanta F, MD    Family History Family History  Problem Relation Age of Onset  . Heart failure Brother     Social History Social History   Tobacco Use  . Smoking status: Never Smoker  . Smokeless tobacco: Never Used  Substance Use Topics  . Alcohol use: No  . Drug use: No     Allergies   Remeron [mirtazapine]   Review of Systems Review of Systems  Constitutional: Negative for chills and fever.  Eyes: Negative for visual disturbance.  Respiratory: Negative for cough, chest tightness and shortness of breath.   Cardiovascular: Negative for chest pain and palpitations.  Gastrointestinal: Negative for abdominal pain, diarrhea, nausea and vomiting.  Genitourinary: Negative for dysuria and frequency.  Musculoskeletal: Positive for arthralgias (L hip, resolved at present).       Positive for pain in bilateral hands, resolved at present.   Neurological: Negative for dizziness, weakness, light-headedness, numbness and headaches.    Psychiatric/Behavioral: Positive for confusion (per nursing home this morning).  All other systems reviewed and are negative.   Physical Exam Updated Vital Signs BP 105/90 (BP Location: Left Arm)   Pulse 73   Resp 18   Ht 5\' 11"  (1.803 m)   Wt 68 kg (150 lb)   SpO2 99%   BMI 20.92 kg/m   Physical Exam  Constitutional: He is oriented to person, place, and time. He appears well-developed and well-nourished.  Non-toxic appearance. No distress.  HENT:  Head: Normocephalic and atraumatic.  Mouth/Throat: Uvula is midline and oropharynx is clear and moist.  Eyes: Conjunctivae and EOM are normal. Pupils are equal, round, and reactive to light. Right eye exhibits no discharge. Left eye exhibits no discharge.  Neck: Normal range of motion. Neck supple. No spinous process tenderness present.  Cardiovascular: Normal rate and regular rhythm.  No murmur heard. Pulmonary/Chest: Breath sounds normal. No respiratory distress. He has no wheezes. He has  no rales.  Abdominal: Soft. He exhibits no distension. There is no tenderness.  Musculoskeletal:  Back: no midline tenderness.  Extremities: No obvious deformity. Full ROM x 4. No bony tenderness.   Neurological: He is alert and oriented to person, place, and time.  Alert. Clear speech. No facial droop. CNIII-XII are intact. Bilateral upper and lower extremities' sensation intact to sharp and dull touch. 5/5 grip strength bilaterally. 5/5 plantar and dorsi flexion bilaterally. Patellar DTRs are 2+ and symmetric . Normal finger to nose bilaterally. Gait is normal.   Skin: Skin is warm and dry. No rash noted.  Psychiatric: He has a normal mood and affect. His behavior is normal.  Nursing note and vitals reviewed.   ED Treatments / Results  Labs Results for orders placed or performed during the hospital encounter of 02/27/17  Urinalysis, Routine w reflex microscopic  Result Value Ref Range   Color, Urine AMBER (A) YELLOW   APPearance HAZY (A)  CLEAR   Specific Gravity, Urine 1.025 1.005 - 1.030   pH 5.0 5.0 - 8.0   Glucose, UA NEGATIVE NEGATIVE mg/dL   Hgb urine dipstick NEGATIVE NEGATIVE   Bilirubin Urine NEGATIVE NEGATIVE   Ketones, ur NEGATIVE NEGATIVE mg/dL   Protein, ur NEGATIVE NEGATIVE mg/dL   Nitrite NEGATIVE NEGATIVE   Leukocytes, UA NEGATIVE NEGATIVE  Comprehensive metabolic panel  Result Value Ref Range   Sodium 138 135 - 145 mmol/L   Potassium 4.2 3.5 - 5.1 mmol/L   Chloride 104 101 - 111 mmol/L   CO2 23 22 - 32 mmol/L   Glucose, Bld 106 (H) 65 - 99 mg/dL   BUN 25 (H) 6 - 20 mg/dL   Creatinine, Ser 1.611.33 (H) 0.61 - 1.24 mg/dL   Calcium 9.4 8.9 - 09.610.3 mg/dL   Total Protein 6.4 (L) 6.5 - 8.1 g/dL   Albumin 2.8 (L) 3.5 - 5.0 g/dL   AST 21 15 - 41 U/L   ALT 25 17 - 63 U/L   Alkaline Phosphatase 58 38 - 126 U/L   Total Bilirubin 0.5 0.3 - 1.2 mg/dL   GFR calc non Af Amer 46 (L) >60 mL/min   GFR calc Af Amer 54 (L) >60 mL/min   Anion gap 11 5 - 15  Troponin I  Result Value Ref Range   Troponin I <0.03 <0.03 ng/mL  Lactic acid, plasma  Result Value Ref Range   Lactic Acid, Venous 1.2 0.5 - 1.9 mmol/L  Lactic acid, plasma  Result Value Ref Range   Lactic Acid, Venous 1.1 0.5 - 1.9 mmol/L  CBC with Differential  Result Value Ref Range   WBC 6.7 4.0 - 10.5 K/uL   RBC 3.62 (L) 4.22 - 5.81 MIL/uL   Hemoglobin 10.7 (L) 13.0 - 17.0 g/dL   HCT 04.533.4 (L) 40.939.0 - 81.152.0 %   MCV 92.3 78.0 - 100.0 fL   MCH 29.6 26.0 - 34.0 pg   MCHC 32.0 30.0 - 36.0 g/dL   RDW 91.412.5 78.211.5 - 95.615.5 %   Platelets 244 150 - 400 K/uL   Neutrophils Relative % 59 %   Neutro Abs 3.9 1.7 - 7.7 K/uL   Lymphocytes Relative 23 %   Lymphs Abs 1.5 0.7 - 4.0 K/uL   Monocytes Relative 15 %   Monocytes Absolute 1.0 0.1 - 1.0 K/uL   Eosinophils Relative 3 %   Eosinophils Absolute 0.2 0.0 - 0.7 K/uL   Basophils Relative 0 %   Basophils Absolute 0.0 0.0 - 0.1  K/uL  CBG monitoring, ED  Result Value Ref Range   Glucose-Capillary 109 (H) 65 - 99  mg/dL  POC occult blood, ED  Result Value Ref Range   Fecal Occult Bld NEGATIVE NEGATIVE   EKG  EKG Interpretation  Date/Time:  Monday February 27 2017 14:45:59 EST Ventricular Rate:  59 PR Interval:    QRS Duration: 92 QT Interval:  390 QTC Calculation: 387 R Axis:   76 Text Interpretation:  Sinus rhythm Low voltage, extremity leads When compared with ECG of 09/11/2014 No significant change was found Confirmed by Samuel Jester [phone]) on 02/27/2017 3:03:34 PM      Radiology Dg Chest 2 View  Result Date: 02/27/2017 CLINICAL DATA:  Altered mental status.  Confusion. EXAM: CHEST  2 VIEW COMPARISON:  07/03/2015 FINDINGS: Overall heart size is normal. Tortuosity and calcification of the thoracic aorta. CABG. Lungs are clear.  No effusions.  No acute bone abnormality. IMPRESSION: No active cardiopulmonary disease. Aortic atherosclerosis. Electronically Signed   By: Francene Boyers M.D.   On: 02/27/2017 14:00   Ct Head Wo Contrast  Result Date: 02/27/2017 CLINICAL DATA:  Pt reports left hip pain and bilateral hand pain at 2 am this am. Pt denies pain at present. Pt alert to self, situation, and place. Pt has history of dementia. Per facility pt brought over for evaluation of altered mental status/possible UTI; hx stroke EXAM: CT HEAD WITHOUT CONTRAST TECHNIQUE: Contiguous axial images were obtained from the base of the skull through the vertex without intravenous contrast. COMPARISON:  07/03/2015 FINDINGS: Brain: No evidence of acute infarction, hemorrhage, hydrocephalus, extra-axial collection or mass lesion/mass effect. There is ventricular and sulcal enlargement reflecting moderate generalized atrophy. Old left cerebellar infarct is noted. There is patchy bilateral white matter hypoattenuation consistent with advanced chronic microvascular ischemic change. Vascular: No hyperdense vessel or unexpected calcification. Skull: Normal. Negative for fracture or focal lesion. Sinuses/Orbits: Globes  and orbits are unremarkable. Sinuses and mastoid air cells are clear. Other: None. IMPRESSION: 1. No acute intracranial abnormalities. 2. Atrophy, chronic microvascular ischemic change and old left cerebellar infarct. Electronically Signed   By: Amie Portland M.D.   On: 02/27/2017 14:29   Dg Hand Complete Left  Result Date: 02/27/2017 CLINICAL DATA:  BILATERAL hand pain since 0200 hours EXAM: LEFT HAND - COMPLETE 3+ VIEW COMPARISON:  None FINDINGS: Jewelry artifact at middle finger. Diffuse osseous demineralization. Mild joint space narrowing at first [facility] joint and scattered IP joints. Chondrocalcinosis at triangular fibrocartilage complex. No acute fracture, dislocation or bone destruction. IMPRESSION: Scattered degenerative changes with question CPPD at wrist. No acute osseous abnormalities. Electronically Signed   By: Ulyses Southward M.D.   On: 02/27/2017 13:59   Dg Hand Complete Right  Result Date: 02/27/2017 CLINICAL DATA:  Hand pain. EXAM: RIGHT HAND - COMPLETE 3+ VIEW COMPARISON:  None. FINDINGS: There are mild-to-moderate osteoarthritic changes of the DIP joints of the fingers and of the IP joint of the thumb as well as of the first [facility] joint. Slight arthritis at the distal radioulnar joint. Old healed fracture of the fifth metacarpal. Slight arthritis at the third and fifth MCP joints. No acute abnormalities. IMPRESSION: Osteoarthritic changes.  No acute abnormality. Electronically Signed   By: Francene Boyers M.D.   On: 02/27/2017 13:59   Dg Hip Unilat With Pelvis 2-3 Views Left  Result Date: 02/27/2017 CLINICAL DATA:  LEFT hip pain since 0200 hours today, history dementia EXAM: DG HIP (WITH OR WITHOUT PELVIS) 2-3V LEFT COMPARISON:  None  FINDINGS: Osseous demineralization. Hip and SI joint spaces preserved. No acute fracture, dislocation, or bone destruction. Degenerative disc and facet disease changes at visualized lower lumbar spine. Extensive atherosclerotic calcifications and scattered pelvic  phleboliths. IMPRESSION: No acute abnormalities. Electronically Signed   By: Ulyses Southward M.D.   On: 02/27/2017 13:57    Procedures Procedures (including critical care time)  Medications Ordered in ED Medications  sodium chloride 0.9 % bolus 1,000 mL (1,000 mLs Intravenous New Bag/Given 02/27/17 1541)     Initial Impression / Assessment and Plan / ED Course  I have reviewed the triage vital signs and the nursing notes.  Pertinent labs & imaging results that were available during my care of the patient were reviewed by me and considered in my medical decision making (see chart for details).    Patient presents from assisted living facility due to concern for AMS this AM. Upon arrival to the ED patient is alert and oriented x 3, able to ambulate, with no complaints at this time. Given assisted living facility concern will evaluate with head CT, CXR, EKG, and screening labs. Will also obtain X-rays of bilateral hands and L hip given patient's complaint of pain in these locations earlier this morning, resolved at present.   Head CT negative, doubt mass or bleed. No neurologic deficits on exam, completely normal neuro exam, doubt ischemic CVA. Troponin negative and EKG without significant ST/T wave changes, no significant change from previous on 09/11/2014, doubt ACS. Patient is afebrile, lungs clear to auscultation bilaterally, CXR negative for infiltrate, doubt pneumonia. UA without indication of infection. Musculoskeletal X-rays negative for acute abnormality. Screening labs fairly unremarkable with the exception of hgb/hct. Of note there is no leukocytosis, lactic acid x 2 WNL, and no significant electrolyte abnormality. Creatine elevated at 1.33 this is improved from patient's previous, at baseline.   Hgb of 10.7 this is decreased from 12.4 one year prior. Fecal occult blood obtained and was negative. Orthostatic vitals (+), patient asymptomatic. Will hydrate with 1 L of fluids.   Patient still  with systolic BP decrease from 139 to 114, however diastolic increased from 71 to 98 with transition from supine to standing. Patient is asymptomatic, denies dizziness, lightheadedness, change in vision, numbness, weakness, chest pain, or dyspnea. He is alert and oriented x 3 and states he is ready to go home. I discussed results, need for PCP follow-up, and return precautions with the patient. Provided opportunity for questions, patient confirmed understanding and is in agreement with plan.   Findings and plan of care discussed with supervising physician Dr. Clarene Duke who personally evaluated this patient.   Final Clinical Impressions(s) / ED Diagnoses   Final diagnoses:  Episode of confusion  Anemia, unspecified type    ED Discharge Orders    None       Cherly Anderson, PA-C 02/27/17 1716    Samuel Jester, DO 03/01/17 1918

## 2017-02-27 NOTE — Discharge Instructions (Signed)
You were seen in the emergency department due to concern that you were confused this morning. We did several test to evaluate this including the following:  - Head CT - this did not show any new abnormalities - Chest X-ray- this did not show any signs of pneumonia - X-rays of your hands and hip- these did not show any fractures or dislocations - EKG- this looked similar to the last EKG you had in 2016, no changes concerning for heart attack.  - Lab work- your lab work was very similar to previous labs you have had done. Your hemoglobin and hematocrit were a little low, but not to the point requiring a blood transfusion. Your white blood cell count and lactic acid levels were not elevated, these are things we check for infection. Your urine sample did not show signs of infection. Your electrolytes were fairly normal. Your kidney function was similar to previous.  - Hemoccult stool test- there was no blood in your stool   We did not find a definitive reason as to why you were confused this morning. You were given 1 Liter of fluids in the emergency department.   Follow up with your primary care provider in the next 1 week for re-evaluation. Return to the emergency department for any new or worsening symptoms including, but not limited to dizziness, lightheadedness, chest pain, difficulty breathing, change in vision, numbness, weakness, or confusion.

## 2017-02-27 NOTE — ED Notes (Signed)
Discharge instructions reviewed with pt and [facility] staff member [facility]([place]). [facility] to come pick up pt in approx 30min.

## 2017-03-01 LAB — URINE CULTURE

## 2017-03-08 ENCOUNTER — Other Ambulatory Visit: Payer: Self-pay | Admitting: Family Medicine

## 2017-03-10 ENCOUNTER — Encounter: Payer: Self-pay | Admitting: Family Medicine

## 2017-03-10 ENCOUNTER — Other Ambulatory Visit: Payer: Self-pay

## 2017-03-10 ENCOUNTER — Ambulatory Visit (INDEPENDENT_AMBULATORY_CARE_PROVIDER_SITE_OTHER): Payer: MEDICARE | Admitting: Family Medicine

## 2017-03-10 VITALS — BP 142/78 | HR 72 | Temp 98.1°F | Resp 16 | Ht 71.0 in | Wt 139.2 lb

## 2017-03-10 DIAGNOSIS — M1990 Unspecified osteoarthritis, unspecified site: Secondary | ICD-10-CM | POA: Insufficient documentation

## 2017-03-10 DIAGNOSIS — M8949 Other hypertrophic osteoarthropathy, multiple sites: Secondary | ICD-10-CM

## 2017-03-10 DIAGNOSIS — M159 Polyosteoarthritis, unspecified: Secondary | ICD-10-CM

## 2017-03-10 DIAGNOSIS — M150 Primary generalized (osteo)arthritis: Secondary | ICD-10-CM

## 2017-03-10 DIAGNOSIS — L853 Xerosis cutis: Secondary | ICD-10-CM

## 2017-03-10 DIAGNOSIS — D649 Anemia, unspecified: Secondary | ICD-10-CM

## 2017-03-10 DIAGNOSIS — E46 Unspecified protein-calorie malnutrition: Secondary | ICD-10-CM

## 2017-03-10 DIAGNOSIS — I10 Essential (primary) hypertension: Secondary | ICD-10-CM

## 2017-03-10 DIAGNOSIS — N182 Chronic kidney disease, stage 2 (mild): Secondary | ICD-10-CM

## 2017-03-10 DIAGNOSIS — L84 Corns and callosities: Secondary | ICD-10-CM

## 2017-03-10 DIAGNOSIS — F0150 Vascular dementia without behavioral disturbance: Secondary | ICD-10-CM

## 2017-03-10 DIAGNOSIS — I2510 Atherosclerotic heart disease of native coronary artery without angina pectoris: Secondary | ICD-10-CM

## 2017-03-10 MED ORDER — ACETAMINOPHEN 500 MG PO TABS: 500.0000 mg | ORAL_TABLET | Freq: Two times a day (BID) | ORAL | 3 refills | Status: AC

## 2017-03-10 MED ORDER — TRIAMCINOLONE 0.1 % CREAM:EUCERIN CREAM 1:1: TOPICAL_CREAM | CUTANEOUS | 2 refills | Status: DC

## 2017-03-10 MED ORDER — TRAMADOL HCL 50 MG PO TABS: 50.0000 mg | ORAL_TABLET | Freq: Two times a day (BID) | ORAL | 1 refills | Status: DC | PRN

## 2017-03-10 NOTE — Patient Instructions (Addendum)
F/U 1 month for weight check  Ensure twice a day  For arthritis tylenol twice a day and Ultram prn severe pain For itching will give Eucerin 1:1 cream BID

## 2017-03-10 NOTE — Progress Notes (Signed)
Subjective:    Patient ID: Alex StackCharles R Williamson, male    DOB: 10/13/1927, 84 y.o.   MRN: [id number]  Patient presents for Loss of mobility (x1 month- states that he is having joint pain and it locks up and he can't move- has been evaluated at ER)  Pt here due joint discomfort , he has vascular dementia making him a difficult historian. Currently resides in [facility]F- [facility].  He was seen in ED on 12/24 with confusion and weakness, he would not get out of bed or walk, however in the ER he was oriented and denied any problems, stated he just didn't want to get up. He had AMS work up, CT no acute abnormality, cardiac work up neg, CXR neg for PNA, UA no sign of infection, xrays done hands showed osotearthritis, DDD, OA in spine He is asking for something for pain, has ultram on list prn , but he does not ask for it    Itching on back on and off , facility has not noted any rash  Ciorns and callus on feet, chronic , needs script for more callus pads to be applied    He had mild elevation of creatnine at 1.33 otherwise labs at baseline   Weight down 7lbs since July, aide from facility states she does not eat the meals provided, eats snacks and cereal in his room. He did recently have dentures fit     Review Of Systems:  GEN- denies fatigue, fever,+ weight loss,weakness, recent illness HEENT- denies eye drainage, change in vision, nasal discharge, CVS- denies chest pain, palpitations RESP- denies SOB, cough, wheeze ABD- denies N/V, change in stools, abd pain GU- denies dysuria, hematuria, dribbling, incontinence MSK- + joint pain,denies  muscle aches, injury Neuro- denies headache, dizziness, syncope, seizure activity       Objective:    BP (!) 142/78   Pulse 72   Temp 98.1 F (36.7 C) (Oral)   Resp 16   Ht 5\' 11"  (1.803 m)   Wt 139 lb 3.2 oz (63.1 kg)   SpO2 96%   BMI 19.41 kg/m  GEN- NAD, alert and oriented x3 HEENT- PERRL, EOMI, non injected sclera, pink conjunctiva, MMM,  oropharynx clear CVS- RRR,  2/6 SEM  RESP- CTAB ABD-NABS,soft,NT,ND Neuro-CNII-XIi intact, blind right eye, unsteady gait,  MSK- Decreased ROM all joints, upper ext and lower ext, no effusion EXT- No edema Skin- excoriations on upper back, few red splotchy areas, dry skin Callus/corn left foot Pulses- Radial  2+        Assessment & Plan:      Problem List Items Addressed This Visit      Unprioritized   Arteriosclerotic cardiovascular disease (ASCVD)   Chronic kidney disease, stage 2, mildly decreased GFR   Vascular dementia - Primary    In ALF, concern for weight loss, since he wont eat was is provided Add ensure BID Recheck weight in 1 month Malignancy is possible as well will see if dietary adjustments help first      Osteoarthritis    Will schedule tylenol BID, ultram daily prn      Relevant Medications   acetaminophen (TYLENOL) 500 MG tablet   traMADol (ULTRAM) 50 MG tablet   Essential hypertension    BP is controlled for age       Other Visit Diagnoses    Corn or callus       OTC pads to be used, podiatry sees pt at facilty   Dry skin dermatitis  Eucerin with triamcinolone to be applied   Protein malnutrition (HCC)       Mild anemia       will trend, no sign of acute bleeding      Note: This dictation was prepared with Dragon dictation along with smaller phrase technology. Any transcriptional errors that result from this process are unintentional.

## 2017-03-11 ENCOUNTER — Encounter: Payer: Self-pay | Admitting: Family Medicine

## 2017-03-11 NOTE — Assessment & Plan Note (Signed)
BP is controlled for age

## 2017-03-11 NOTE — Assessment & Plan Note (Signed)
In ALF, concern for weight loss, since he wont eat was is provided Add ensure BID Recheck weight in 1 month Malignancy is possible as well will see if dietary adjustments help first

## 2017-03-11 NOTE — Assessment & Plan Note (Signed)
Will schedule tylenol BID, ultram daily prn

## 2017-03-16 ENCOUNTER — Telehealth: Payer: Self-pay | Admitting: *Deleted

## 2017-03-16 NOTE — Telephone Encounter (Signed)
Received call from [place], [facility] [phone]~ telephone.   Requested VO to [facility] 2x weekly x5 weeks for pain management, strengthening, and gait training. VO given.

## 2017-03-17 ENCOUNTER — Telehealth: Payer: Self-pay | Admitting: *Deleted

## 2017-03-17 NOTE — Telephone Encounter (Signed)
Received call from [place], [facility].   Requested to extend [facility] 2x weekly x3 weeks for weakness in B hands and balance training.   VO given.

## 2017-03-17 NOTE — Telephone Encounter (Signed)
noted 

## 2017-04-10 ENCOUNTER — Ambulatory Visit: Payer: MEDICARE | Admitting: Family Medicine

## 2017-04-18 ENCOUNTER — Telehealth: Payer: Self-pay | Admitting: *Deleted

## 2017-04-18 NOTE — Telephone Encounter (Signed)
Received call from [place], [facility]. [phone]~ telephone.   Requested VO to extend PT services 2x weekly x3 weeks for strengthening, balance with cane, and gait.   VO given.

## 2017-04-18 NOTE — Telephone Encounter (Signed)
noted 

## 2017-06-16 ENCOUNTER — Other Ambulatory Visit: Payer: Self-pay | Admitting: Family Medicine

## 2017-06-19 ENCOUNTER — Other Ambulatory Visit: Payer: Self-pay | Admitting: Family Medicine

## 2017-08-25 ENCOUNTER — Other Ambulatory Visit: Payer: Self-pay

## 2017-08-25 ENCOUNTER — Ambulatory Visit (INDEPENDENT_AMBULATORY_CARE_PROVIDER_SITE_OTHER): Payer: MEDICARE | Admitting: Family Medicine

## 2017-08-25 ENCOUNTER — Telehealth: Payer: Self-pay | Admitting: *Deleted

## 2017-08-25 ENCOUNTER — Ambulatory Visit (HOSPITAL_COMMUNITY)
Admission: RE | Admit: 2017-08-25 | Discharge: 2017-08-25 | Disposition: A | Discharge status: Home / Self Care | Payer: MEDICARE | Source: Ambulatory Visit | Attending: Family Medicine | Admitting: Family Medicine

## 2017-08-25 ENCOUNTER — Encounter: Payer: Self-pay | Admitting: *Deleted

## 2017-08-25 ENCOUNTER — Encounter: Payer: Self-pay | Admitting: Family Medicine

## 2017-08-25 VITALS — BP 144/72 | HR 68 | Temp 98.0°F | Resp 14 | Ht 71.0 in | Wt 125.0 lb

## 2017-08-25 DIAGNOSIS — J181 Lobar pneumonia, unspecified organism: Secondary | ICD-10-CM

## 2017-08-25 DIAGNOSIS — R531 Weakness: Secondary | ICD-10-CM | POA: Diagnosis not present

## 2017-08-25 DIAGNOSIS — J440 Chronic obstructive pulmonary disease with acute lower respiratory infection: Secondary | ICD-10-CM | POA: Insufficient documentation

## 2017-08-25 DIAGNOSIS — R05 Cough: Secondary | ICD-10-CM

## 2017-08-25 DIAGNOSIS — J690 Pneumonitis due to inhalation of food and vomit: Secondary | ICD-10-CM | POA: Diagnosis not present

## 2017-08-25 DIAGNOSIS — E43 Unspecified severe protein-calorie malnutrition: Secondary | ICD-10-CM

## 2017-08-25 DIAGNOSIS — J189 Pneumonia, unspecified organism: Secondary | ICD-10-CM

## 2017-08-25 DIAGNOSIS — I2510 Atherosclerotic heart disease of native coronary artery without angina pectoris: Secondary | ICD-10-CM

## 2017-08-25 DIAGNOSIS — R059 Cough, unspecified: Secondary | ICD-10-CM

## 2017-08-25 DIAGNOSIS — R1310 Dysphagia, unspecified: Secondary | ICD-10-CM

## 2017-08-25 LAB — CBC WITH DIFFERENTIAL/PLATELET
BASOS ABS: 18 {cells}/uL (ref 0–200)
Basophils Relative: 0.2 %
EOS ABS: 54 {cells}/uL (ref 15–500)
Eosinophils Relative: 0.6 %
HCT: 33.6 % — ABNORMAL LOW (ref 38.5–50.0)
Hemoglobin: 11.5 g/dL — ABNORMAL LOW (ref 13.2–17.1)
Lymphs Abs: 1872 cells/uL (ref 850–3900)
MCH: 29.6 pg (ref 27.0–33.0)
MCHC: 34.2 g/dL (ref 32.0–36.0)
MCV: 86.6 fL (ref 80.0–100.0)
MPV: 10 fL (ref 7.5–12.5)
Monocytes Relative: 9.9 %
NEUTROS PCT: 68.5 %
Neutro Abs: 6165 cells/uL (ref 1500–7800)
PLATELETS: 340 10*3/uL (ref 140–400)
RBC: 3.88 10*6/uL — ABNORMAL LOW (ref 4.20–5.80)
RDW: 12.4 % (ref 11.0–15.0)
TOTAL LYMPHOCYTE: 20.8 %
WBC mixed population: 891 cells/uL (ref 200–950)
WBC: 9 10*3/uL (ref 3.8–10.8)

## 2017-08-25 LAB — COMPREHENSIVE METABOLIC PANEL
AG RATIO: 0.9 (calc) — AB (ref 1.0–2.5)
ALT: 27 U/L (ref 9–46)
AST: 23 U/L (ref 10–35)
Albumin: 3.3 g/dL — ABNORMAL LOW (ref 3.6–5.1)
Alkaline phosphatase (APISO): 74 U/L (ref 40–115)
BUN/Creatinine Ratio: 27 (calc) — ABNORMAL HIGH (ref 6–22)
BUN: 40 mg/dL — ABNORMAL HIGH (ref 7–25)
CO2: 28 mmol/L (ref 20–32)
CREATININE: 1.5 mg/dL — AB (ref 0.70–1.11)
Calcium: 10.9 mg/dL — ABNORMAL HIGH (ref 8.6–10.3)
Chloride: 105 mmol/L (ref 98–110)
GLUCOSE: 108 mg/dL — AB (ref 65–99)
Globulin: 3.5 g/dL (calc) (ref 1.9–3.7)
Potassium: 5 mmol/L (ref 3.5–5.3)
Sodium: 141 mmol/L (ref 135–146)
Total Bilirubin: 0.5 mg/dL (ref 0.2–1.2)
Total Protein: 6.8 g/dL (ref 6.1–8.1)

## 2017-08-25 MED ORDER — AMOXICILLIN-POT CLAVULANATE 875-125 MG PO TABS: 1.0000 | ORAL_TABLET | Freq: Two times a day (BID) | ORAL | 0 refills | Status: DC

## 2017-08-25 NOTE — Telephone Encounter (Signed)
D/C the thickened liquids for now

## 2017-08-25 NOTE — Patient Instructions (Addendum)
Modified barium swallow to be done Chest xray needed today  We will call with lab results Nectar thick fluids  F/U4 weeks

## 2017-08-25 NOTE — Telephone Encounter (Signed)
[facility] [phone]~ telephone/ [phone]~ fax.   Results given for CXR with PNA. Advised order to be faxed with [facility].   States that orders were received for thickened liquids. States that facility cannot thicken liquids. If thickened liquids are required, level of care change will need to be performed and patient will need to be moved to another facility.   Requested order to D/C thickened liquids until ST eval and swallow study performed. MD please advise.

## 2017-08-25 NOTE — Progress Notes (Signed)
   Subjective:    Patient ID: Alex StackCharles R Hatchell, male    DOB: 11/19/1927, 84 y.o.   MRN: [id number]  Patient presents for Mouth Issues (not eating well- states that he has tooth pain (has been seen at dentist), states that he has something catching in his throat) and Cough (nonproductive cough that has been ongoing)  Patient [facility].  Over the past week he has not been eating.  He often will put some food in and then spit it back out.  He states that it feels like there is something stuck in his throat.  He will get choked on even liquids.  He does tolerate [employer] has had some eggs and yogurt.  His weight is down 15 pounds since January.  He denies any significant pain anywhere in his body but does state he has had cough with production no fever.  Not had any falls.  Not any specific documentation in his folder today.  He does have vascular dementia  He had spit into tissue, brownish/red discoloration  Review Of Systems: Per above   GEN- denies fatigue, fever, +weight loss,weakness, recent illness HEENT- denies eye drainage, change in vision, nasal discharge, CVS- denies chest pain, palpitations RESP- denies SOB,+ cough, wheeze ABD- denies N/V, change in stools, abd pain GU- denies dysuria, hematuria, dribbling, incontinence MSK- denies joint pain, muscle aches, injury Neuro- denies headache, dizziness, syncope, seizure activity       Objective:    BP (!) 144/72   Pulse 68   Temp 98 F (36.7 C) (Oral)   Resp 14   Ht 5\' 11"  (1.803 m)   Wt 125 lb (56.7 kg)   SpO2 99%   BMI 17.43 kg/m  GEN- NAD, alert and oriented x3, chronically ill appearing, muscle wasting on hands, arms, face  HEENT- PERRL, EOMI, non injected sclera, pink conjunctiva, MMM, oropharynx clear Neck- Supple, no LAD CVS- RRR, no murmur RESP- rhonchi bilat, no wheeze, normal WOB at rest  ABD-NABS,soft,NT,ND EXT- No edema Pulses- Radial  2+        Assessment & Plan:      Problem List Items Addressed This Visit      Unprioritized   Dysphagia - Primary    plan for speech therapy for modified barium swallow, will start nectar thick liquids for now He does have history of having dilatation done years ago for Schatzki's ring      Relevant Orders   SLP modified barium swallow   CBC with Differential/Platelet   Comprehensive metabolic panel    Other Visit Diagnoses    Severe protein-calorie malnutrition (HCC)       Relevant Orders   CBC with Differential/Platelet   Comprehensive metabolic panel   Community acquired pneumonia of right lower lobe of lung (HCC)       STAT xray donw, he has CAP, possible aspiration induced based on dysphagia, will cover with  Augmentin 875-125mg  BID for 10 days, he has tussin DM , reperat xray in 4 weeks.   Relevant Orders   DG Chest 2 View (Completed)   CBC with Differential/Platelet      Note: This dictation was prepared with Dragon dictation along with smaller phrase technology. Any transcriptional errors that result from this process are unintentional.

## 2017-08-25 NOTE — Telephone Encounter (Signed)
Order to be faxed to facility.

## 2017-08-25 NOTE — Assessment & Plan Note (Signed)
plan for speech therapy for modified barium swallow, will start nectar thick liquids for now He does have history of having dilatation done years ago for Schatzki's ring

## 2017-08-28 ENCOUNTER — Emergency Department (HOSPITAL_COMMUNITY): Payer: MEDICARE

## 2017-08-28 ENCOUNTER — Inpatient Hospital Stay (HOSPITAL_COMMUNITY)
Admission: EM | Admit: 2017-08-28 | Discharge: 2017-09-08 | DRG: 177 | Disposition: A | Discharge status: Hospice | Payer: MEDICARE | Source: Skilled Nursing Facility | Attending: Internal Medicine | Admitting: Internal Medicine

## 2017-08-28 ENCOUNTER — Encounter (HOSPITAL_COMMUNITY): Payer: Self-pay

## 2017-08-28 DIAGNOSIS — Z7902 Long term (current) use of antithrombotics/antiplatelets: Secondary | ICD-10-CM | POA: Diagnosis not present

## 2017-08-28 DIAGNOSIS — I129 Hypertensive chronic kidney disease with stage 1 through stage 4 chronic kidney disease, or unspecified chronic kidney disease: Secondary | ICD-10-CM | POA: Diagnosis present

## 2017-08-28 DIAGNOSIS — Z681 Body mass index (BMI) 19 or less, adult: Secondary | ICD-10-CM | POA: Diagnosis not present

## 2017-08-28 DIAGNOSIS — E860 Dehydration: Secondary | ICD-10-CM | POA: Diagnosis present

## 2017-08-28 DIAGNOSIS — Z79899 Other long term (current) drug therapy: Secondary | ICD-10-CM

## 2017-08-28 DIAGNOSIS — Z515 Encounter for palliative care: Secondary | ICD-10-CM

## 2017-08-28 DIAGNOSIS — Z8249 Family history of ischemic heart disease and other diseases of the circulatory system: Secondary | ICD-10-CM

## 2017-08-28 DIAGNOSIS — Z951 Presence of aortocoronary bypass graft: Secondary | ICD-10-CM

## 2017-08-28 DIAGNOSIS — F0150 Vascular dementia without behavioral disturbance: Secondary | ICD-10-CM | POA: Diagnosis present

## 2017-08-28 DIAGNOSIS — R531 Weakness: Secondary | ICD-10-CM | POA: Diagnosis present

## 2017-08-28 DIAGNOSIS — N183 Chronic kidney disease, stage 3 unspecified: Secondary | ICD-10-CM

## 2017-08-28 DIAGNOSIS — H3530 Unspecified macular degeneration: Secondary | ICD-10-CM | POA: Diagnosis present

## 2017-08-28 DIAGNOSIS — H548 Legal blindness, as defined in USA: Secondary | ICD-10-CM | POA: Diagnosis present

## 2017-08-28 DIAGNOSIS — Z8701 Personal history of pneumonia (recurrent): Secondary | ICD-10-CM

## 2017-08-28 DIAGNOSIS — Z7189 Other specified counseling: Secondary | ICD-10-CM | POA: Diagnosis not present

## 2017-08-28 DIAGNOSIS — Z66 Do not resuscitate: Secondary | ICD-10-CM | POA: Diagnosis present

## 2017-08-28 DIAGNOSIS — Y95 Nosocomial condition: Secondary | ICD-10-CM | POA: Diagnosis present

## 2017-08-28 DIAGNOSIS — Z888 Allergy status to other drugs, medicaments and biological substances status: Secondary | ICD-10-CM

## 2017-08-28 DIAGNOSIS — Z8673 Personal history of transient ischemic attack (TIA), and cerebral infarction without residual deficits: Secondary | ICD-10-CM

## 2017-08-28 DIAGNOSIS — I252 Old myocardial infarction: Secondary | ICD-10-CM

## 2017-08-28 DIAGNOSIS — I959 Hypotension, unspecified: Secondary | ICD-10-CM | POA: Diagnosis not present

## 2017-08-28 DIAGNOSIS — J690 Pneumonitis due to inhalation of food and vomit: Principal | ICD-10-CM | POA: Diagnosis present

## 2017-08-28 DIAGNOSIS — F329 Major depressive disorder, single episode, unspecified: Secondary | ICD-10-CM | POA: Diagnosis present

## 2017-08-28 DIAGNOSIS — I2510 Atherosclerotic heart disease of native coronary artery without angina pectoris: Secondary | ICD-10-CM | POA: Diagnosis present

## 2017-08-28 DIAGNOSIS — R627 Adult failure to thrive: Secondary | ICD-10-CM | POA: Diagnosis present

## 2017-08-28 DIAGNOSIS — K219 Gastro-esophageal reflux disease without esophagitis: Secondary | ICD-10-CM | POA: Diagnosis present

## 2017-08-28 DIAGNOSIS — E785 Hyperlipidemia, unspecified: Secondary | ICD-10-CM | POA: Diagnosis present

## 2017-08-28 DIAGNOSIS — F419 Anxiety disorder, unspecified: Secondary | ICD-10-CM | POA: Diagnosis present

## 2017-08-28 DIAGNOSIS — J189 Pneumonia, unspecified organism: Secondary | ICD-10-CM | POA: Diagnosis not present

## 2017-08-28 DIAGNOSIS — E43 Unspecified severe protein-calorie malnutrition: Secondary | ICD-10-CM

## 2017-08-28 DIAGNOSIS — I10 Essential (primary) hypertension: Secondary | ICD-10-CM | POA: Diagnosis not present

## 2017-08-28 LAB — COMPREHENSIVE METABOLIC PANEL
ALK PHOS: 63 U/L (ref 38–126)
ALT: 27 U/L (ref 17–63)
AST: 26 U/L (ref 15–41)
Albumin: 3.2 g/dL — ABNORMAL LOW (ref 3.5–5.0)
Anion gap: 11 (ref 5–15)
BILIRUBIN TOTAL: 0.6 mg/dL (ref 0.3–1.2)
BUN: 54 mg/dL — ABNORMAL HIGH (ref 6–20)
CO2: 24 mmol/L (ref 22–32)
CREATININE: 1.7 mg/dL — AB (ref 0.61–1.24)
Calcium: 11.5 mg/dL — ABNORMAL HIGH (ref 8.9–10.3)
Chloride: 106 mmol/L (ref 101–111)
GFR, EST AFRICAN AMERICAN: 40 mL/min — AB (ref >60)
GFR, EST NON AFRICAN AMERICAN: 34 mL/min — AB (ref >60)
Glucose, Bld: 142 mg/dL — ABNORMAL HIGH (ref 65–99)
Potassium: 5 mmol/L (ref 3.5–5.1)
Sodium: 141 mmol/L (ref 135–145)
TOTAL PROTEIN: 7.8 g/dL (ref 6.5–8.1)

## 2017-08-28 LAB — CBC WITH DIFFERENTIAL/PLATELET
Basophils Absolute: 0 10*3/uL (ref 0.0–0.1)
Basophils Relative: 0 %
EOS PCT: 0 %
Eosinophils Absolute: 0 10*3/uL (ref 0.0–0.7)
HEMATOCRIT: 35.9 % — AB (ref 39.0–52.0)
HEMOGLOBIN: 12 g/dL — AB (ref 13.0–17.0)
LYMPHS ABS: 1 10*3/uL (ref 0.7–4.0)
LYMPHS PCT: 7 %
MCH: 30.6 pg (ref 26.0–34.0)
MCHC: 33.4 g/dL (ref 30.0–36.0)
MCV: 91.6 fL (ref 78.0–100.0)
Monocytes Absolute: 0.7 10*3/uL (ref 0.1–1.0)
Monocytes Relative: 5 %
Neutro Abs: 13.3 10*3/uL — ABNORMAL HIGH (ref 1.7–7.7)
Neutrophils Relative %: 88 %
PLATELETS: 300 10*3/uL (ref 150–400)
RBC: 3.92 MIL/uL — AB (ref 4.22–5.81)
RDW: 12.9 % (ref 11.5–15.5)
WBC: 15 10*3/uL — AB (ref 4.0–10.5)

## 2017-08-28 LAB — URINALYSIS, ROUTINE W REFLEX MICROSCOPIC
Bilirubin Urine: NEGATIVE
GLUCOSE, UA: NEGATIVE mg/dL
Hgb urine dipstick: NEGATIVE
Ketones, ur: NEGATIVE mg/dL
Leukocytes, UA: NEGATIVE
NITRITE: NEGATIVE
PH: 5 (ref 5.0–8.0)
Protein, ur: NEGATIVE mg/dL
Specific Gravity, Urine: 1.027 (ref 1.005–1.030)

## 2017-08-28 MED ORDER — CARVEDILOL 3.125 MG PO TABS
3.1250 mg | ORAL_TABLET | Freq: Two times a day (BID) | ORAL | Status: DC
  Administered 2017-08-28 – 2017-09-06 (×14): 3.125 mg via ORAL
  Filled 2017-08-28 (×16): qty 1

## 2017-08-28 MED ORDER — ATORVASTATIN CALCIUM 20 MG PO TABS
20.0000 mg | ORAL_TABLET | Freq: Every day | ORAL | Status: DC
  Administered 2017-08-28 – 2017-09-05 (×9): 20 mg via ORAL
  Filled 2017-08-28 (×9): qty 1

## 2017-08-28 MED ORDER — SODIUM CHLORIDE 0.9 % IV BOLUS
1000.0000 mL | Freq: Once | INTRAVENOUS | Status: AC
  Administered 2017-08-28: 1000 mL via INTRAVENOUS

## 2017-08-28 MED ORDER — SODIUM CHLORIDE 0.9 % IV SOLN
INTRAVENOUS | Status: DC
  Administered 2017-08-28 – 2017-08-31 (×4): via INTRAVENOUS

## 2017-08-28 MED ORDER — CLOPIDOGREL BISULFATE 75 MG PO TABS
75.0000 mg | ORAL_TABLET | Freq: Every day | ORAL | Status: DC
  Administered 2017-08-28 – 2017-09-05 (×9): 75 mg via ORAL
  Filled 2017-08-28 (×9): qty 1

## 2017-08-28 MED ORDER — PIPERACILLIN-TAZOBACTAM 3.375 G IVPB 30 MIN
3.3750 g | Freq: Once | INTRAVENOUS | Status: AC
  Administered 2017-08-28: 3.375 g via INTRAVENOUS
  Filled 2017-08-28: qty 50

## 2017-08-28 MED ORDER — ACETAMINOPHEN 650 MG RE SUPP: 650.0000 mg | Freq: Four times a day (QID) | RECTAL | Status: DC | PRN

## 2017-08-28 MED ORDER — ACETAMINOPHEN 325 MG PO TABS: 650.0000 mg | ORAL_TABLET | Freq: Four times a day (QID) | ORAL | Status: DC | PRN

## 2017-08-28 MED ORDER — PIPERACILLIN-TAZOBACTAM 3.375 G IVPB
3.3750 g | Freq: Three times a day (TID) | INTRAVENOUS | Status: DC
  Administered 2017-08-29 (×2): 3.375 g via INTRAVENOUS
  Filled 2017-08-28 (×2): qty 50

## 2017-08-28 MED ORDER — OXYBUTYNIN CHLORIDE 5 MG PO TABS
5.0000 mg | ORAL_TABLET | Freq: Two times a day (BID) | ORAL | Status: DC
  Administered 2017-08-28 – 2017-09-08 (×21): 5 mg via ORAL
  Filled 2017-08-28 (×21): qty 1

## 2017-08-28 MED ORDER — SODIUM CHLORIDE 0.9 % IV SOLN
1.0000 g | Freq: Once | INTRAVENOUS | Status: AC
  Administered 2017-08-28: 1 g via INTRAVENOUS
  Filled 2017-08-28: qty 10

## 2017-08-28 MED ORDER — PANTOPRAZOLE SODIUM 40 MG PO TBEC
40.0000 mg | DELAYED_RELEASE_TABLET | Freq: Every day | ORAL | Status: DC
  Administered 2017-08-30 – 2017-09-08 (×10): 40 mg via ORAL
  Filled 2017-08-28 (×10): qty 1

## 2017-08-28 MED ORDER — ONDANSETRON HCL 4 MG PO TABS: 4.0000 mg | ORAL_TABLET | Freq: Four times a day (QID) | ORAL | Status: DC | PRN

## 2017-08-28 MED ORDER — AZITHROMYCIN 500 MG IV SOLR: 500.0000 mg | INTRAVENOUS | Status: DC

## 2017-08-28 MED ORDER — LACTULOSE 10 GM/15ML PO SOLN: 20.0000 g | Freq: Every day | ORAL | Status: DC | PRN

## 2017-08-28 MED ORDER — FAMOTIDINE 20 MG PO TABS
20.0000 mg | ORAL_TABLET | Freq: Every day | ORAL | Status: DC
  Administered 2017-08-30 – 2017-09-06 (×8): 20 mg via ORAL
  Filled 2017-08-28 (×8): qty 1

## 2017-08-28 MED ORDER — VANCOMYCIN HCL IN DEXTROSE 1-5 GM/200ML-% IV SOLN
1000.0000 mg | Freq: Once | INTRAVENOUS | Status: AC
  Administered 2017-08-28: 1000 mg via INTRAVENOUS
  Filled 2017-08-28: qty 200

## 2017-08-28 MED ORDER — ENOXAPARIN SODIUM 30 MG/0.3ML ~~LOC~~ SOLN
30.0000 mg | SUBCUTANEOUS | Status: DC
  Administered 2017-08-28 – 2017-08-29 (×2): 30 mg via SUBCUTANEOUS
  Filled 2017-08-28 (×2): qty 0.3

## 2017-08-28 MED ORDER — QUETIAPINE FUMARATE 25 MG PO TABS
25.0000 mg | ORAL_TABLET | Freq: Every day | ORAL | Status: DC
  Administered 2017-08-28 – 2017-09-07 (×11): 25 mg via ORAL
  Filled 2017-08-28 (×11): qty 1

## 2017-08-28 MED ORDER — VANCOMYCIN HCL 500 MG IV SOLR: 500.0000 mg | INTRAVENOUS | Status: DC

## 2017-08-28 MED ORDER — ONDANSETRON HCL 4 MG/2ML IJ SOLN: 4.0000 mg | Freq: Four times a day (QID) | INTRAMUSCULAR | Status: DC | PRN

## 2017-08-28 NOTE — H&P (Signed)
TRH H&P    Patient Demographics:    Alex Williamson, is a 84 y.o. male  MRN: [id number]  DOB - 04-Oct-1927  Admit Date - 08/28/2017  Referring MD/NP/PA: Dr. Charm Barges  Outpatient Primary MD for the patient is [facility], Velna Hatchet, MD  Patient coming from: Assisted living  Chief complaint-generalized weakness   HPI:    Alex Williamson  is a 84 y.o. male, with history of CVA, CAD, BPH, hypertension, hyperlipidemia, vascular dementia was brought to hospital from assisted living facility with complaints of generalized weakness.  Patient is usually very interactive.  As per caregiver patient has not been eating well since his lower teeth was removed a month ago.  Usually patient is able to walk around and today he was having difficulty walking and the caregiver was concerned that patient may have some underlying illness.  Patient says he has been coughing up phlegm  He denies chest pain. Denies dysuria or abdominal pain. No nausea vomiting or diarrhea. He has previous history of CVA  In the ED, chest x-ray showed multifocal pneumonia and patient started on vancomycin and Zosyn for healthcare associated pneumonia.    Review of systems:      All other systems reviewed and are negative.   With Past History of the following :    Past Medical History:  Diagnosis Date  . Anxiety   . Arteriosclerotic cardiovascular disease (ASCVD) 1982   Acute MI in 1982; returned with non-ST segment elevation MI in 07/2010; emergency CABG-Dr. Laneta Simmers  . Benign prostate hyperplasia   . Cerebrovascular disease 2005   left parietal CVA  . Chronic kidney disease, stage 2, mildly decreased GFR   . Depression   . Dysphagia   . GERD (gastroesophageal reflux disease)   . Hyperlipidemia   . Hypertension   . Macular degeneration    + cataract; legally blind  . Stroke [facility])    2nd stroke in May 2017  . Vascular dementia       Past  Surgical History:  Procedure Laterality Date  . CARDIAC SURGERY    . CORONARY ARTERY BYPASS GRAFT    . ESOPHAGOGASTRODUODENOSCOPY  09/20/11   schatzki's ring/hiatal hernia/antal erosion. bx showed gastritis but no h.pylori  . heart bypass  08/2010   [facility]  . HERNIA REPAIR    . MALONEY DILATION  09/20/2011   Procedure: Elease Hashimoto DILATION;  Surgeon: Corbin Ade, MD;  Location: [facility];  Service: Endoscopy;  Laterality: N/A;  . SAVORY DILATION  09/20/2011   Procedure: SAVORY DILATION;  Surgeon: Corbin Ade, MD;  Location: [facility];  Service: Endoscopy;  Laterality: N/A;  . TONSILLECTOMY        Social History:      Social History   Tobacco Use  . Smoking status: Never Smoker  . Smokeless tobacco: Never Used  Substance Use Topics  . Alcohol use: No       Family History :     Family History  Problem Relation Age of Onset  . Heart failure Brother  Home Medications:   Prior to Admission medications   Medication Sig Start Date End Date Taking? Authorizing Provider  acetaminophen (TYLENOL) 500 MG tablet Take 1 tablet (500 mg total) by mouth 2 (two) times daily. FOR PAIN 03/10/17  Yes [place], Velna Hatchet, MD  amoxicillin-clavulanate (AUGMENTIN) 875-125 MG tablet Take 1 tablet by mouth 2 (two) times daily. 08/25/17  Yes [place], Velna Hatchet, MD  atorvastatin (LIPITOR) 20 MG tablet TAKE 1 TABLET BY MOUTH AT BEDTIME. 06/19/17  Yes [place], Velna Hatchet, MD  carvedilol (COREG) 3.125 MG tablet TAKE 1 TABLET BY MOUTH TWICE DAILY. 06/19/17  Yes [place], Velna Hatchet, MD  clopidogrel (PLAVIX) 75 MG tablet TAKE 1 TABLET BY MOUTH AT BEDTIME. 06/19/17  Yes [place], Velna Hatchet, MD  dextromethorphan-guaiFENesin (ROBITUSSIN-DM) 10-100 MG/5ML liquid Take 5 mLs by mouth every 4 (four) hours as needed for cough.   Yes [provider]  Lactulose 20 GM/30ML SOLN Give 30ml po daily for constipation as needed Patient taking differently: Take 30 mLs by mouth daily as needed (for  constipation).  08/20/14  Yes [place], Velna Hatchet, MD  Multiple Vitamin (DAILY VITES) tablet TAKE 1 TABLET BY MOUTH ONCE DAILY. 06/19/17  Yes [place], Velna Hatchet, MD  omeprazole (PRILOSEC) 20 MG capsule TAKE 1 CAPSULE BY MOUTH EACH MORNING. 06/19/17  Yes [place], Velna Hatchet, MD  oxybutynin (DITROPAN) 5 MG tablet TAKE 1 TABLET BY MOUTH TWICE DAILY. 12/20/16  Yes [place], Velna Hatchet, MD  oxybutynin (DITROPAN) 5 MG tablet Take 5 mg by mouth 2 (two) times daily. 12/14/11  Yes [place], Velna Hatchet, MD  QUEtiapine (SEROQUEL) 25 MG tablet TAKE 1 TABLET BY MOUTH AT BEDTIME. 06/19/17  Yes [place], Velna Hatchet, MD  ranitidine (ZANTAC) 150 MG tablet TAKE 1 TABLET BY MOUTH AT BEDTIME. 06/19/17  Yes [place], Velna Hatchet, MD  traMADol (ULTRAM) 50 MG tablet Take 1 tablet (50 mg total) by mouth every 12 (twelve) hours as needed. 03/10/17  Yes [place], Velna Hatchet, MD  triamcinolone cream (KENALOG) 0.1 % Apply 1 application topically 2 (two) times daily. Skin irritation Patient taking differently: Apply 1 application topically 2 (two) times daily as needed. Skin irritation 02/16/16  Yes [place], Velna Hatchet, MD  oxybutynin (DITROPAN) 5 MG tablet Take 1 tablet (5 mg total) by mouth 2 (two) times daily. 12/14/11 04/04/12  Salley Scarlet, MD     Allergies:     Allergies  Allergen Reactions  . Remeron [Mirtazapine] Other (See Comments)    "feels Loopy"     Physical Exam:   Vitals  Blood pressure 127/68, pulse 63, temperature 98.1 F (36.7 C), temperature source Oral, resp. rate (!) 21, height 5\' 11"  (1.803 m), weight 56.7 kg (125 lb), SpO2 98 %.  1.  General: Appears in no acute distress  2. Psychiatric:  Intact judgement and  insight, awake alert, oriented x 3.  3. Neurologic: No focal neurological deficits, all cranial nerves intact.Strength 5/5 all 4 extremities, sensation intact all 4 extremities, plantars down going.  4. Eyes :  anicteric sclerae, moist conjunctivae with no lid lag. PERRLA.  5. ENMT:  Oropharynx clear  with moist mucous membranes and good dentition  6. Neck:  supple, no cervical lymphadenopathy appriciated, No thyromegaly  7. Respiratory : Normal respiratory effort, good air movement bilaterally,clear to  auscultation bilaterally  8. Cardiovascular : RRR, no gallops, rubs or murmurs, no leg edema  9. Gastrointestinal:  Positive bowel sounds, abdomen soft, non-tender to palpation,no hepatosplenomegaly, no rigidity or guarding  10. Skin:  No cyanosis, normal texture and turgor, no rash, lesions or ulcers  11.Musculoskeletal:  Good muscle tone,  joints appear normal , no effusions,  normal range of motion    Data Review:    CBC Recent Labs  Lab 08/25/17 1150 08/28/17 1838  WBC 9.0 15.0*  HGB 11.5* 12.0*  HCT 33.6* 35.9*  PLT 340 300  MCV 86.6 91.6  MCH 29.6 30.6  MCHC 34.2 33.4  RDW 12.4 12.9  LYMPHSABS 1,872 1.0  MONOABS  --  0.7  EOSABS 54 0.0  BASOSABS 18 0.0   ------------------------------------------------------------------------------------------------------------------  Chemistries  Recent Labs  Lab 08/25/17 1150 08/28/17 1838  NA 141 141  K 5.0 5.0  CL 105 106  CO2 28 24  GLUCOSE 108* 142*  BUN 40* 54*  CREATININE 1.50* 1.70*  CALCIUM 10.9* 11.5*  AST 23 26  ALT 27 27  ALKPHOS  --  63  BILITOT 0.5 0.6   ------------------------------------------------------------------------------------------------------------------  ------------------------------------------------------------------------------------------------------------------ GFR: Estimated Creatinine Clearance: 24.1 mL/min (A) (by C-G formula based on SCr of 1.7 mg/dL (H)). Liver Function Tests: Recent Labs  Lab 08/25/17 1150 08/28/17 1838  AST 23 26  ALT 27 27  ALKPHOS  --  63  BILITOT 0.5 0.6  PROT 6.8 7.8  ALBUMIN  --  3.2*   No results for input(s): LIPASE, AMYLASE in the last 168 hours. No results for input(s): AMMONIA in the last 168 hours. Coagulation  Profile: No results for input(s): INR, PROTIME in the last 168 hours. Cardiac Enzymes: No results for input(s): CKTOTAL, CKMB, CKMBINDEX, TROPONINI in the last 168 hours. BNP (last 3 results) No results for input(s): PROBNP in the last 8760 hours. HbA1C: No results for input(s): HGBA1C in the last 72 hours. CBG: No results for input(s): GLUCAP in the last 168 hours. Lipid Profile: No results for input(s): CHOL, HDL, LDLCALC, TRIG, CHOLHDL, LDLDIRECT in the last 72 hours. Thyroid Function Tests: No results for input(s): TSH, T4TOTAL, FREET4, T3FREE, THYROIDAB in the last 72 hours. Anemia Panel: No results for input(s): VITAMINB12, FOLATE, FERRITIN, TIBC, IRON, RETICCTPCT in the last 72 hours.  --------------------------------------------------------------------------------------------------------------- Urine analysis:    Component Value Date/Time   COLORURINE YELLOW 08/28/2017 2000   APPEARANCEUR HAZY (A) 08/28/2017 2000   LABSPEC 1.027 08/28/2017 2000   PHURINE 5.0 08/28/2017 2000   GLUCOSEU NEGATIVE 08/28/2017 2000   HGBUR NEGATIVE 08/28/2017 2000   HGBUR negative 02/06/2008 0908   BILIRUBINUR NEGATIVE 08/28/2017 2000   BILIRUBINUR negative 05/31/2011 1631   KETONESUR NEGATIVE 08/28/2017 2000   PROTEINUR NEGATIVE 08/28/2017 2000   UROBILINOGEN 0.2 05/31/2011 1631   UROBILINOGEN 0.2 02/06/2008 0908   NITRITE NEGATIVE 08/28/2017 2000   LEUKOCYTESUR NEGATIVE 08/28/2017 2000      Imaging Results:    Dg Chest 2 View  Result Date: 08/28/2017 CLINICAL DATA:  Cough EXAM: CHEST - 2 VIEW COMPARISON:  08/25/2017 chest radiograph. FINDINGS: Intact sternotomy wires. Stable cardiomediastinal silhouette with normal heart size. No pneumothorax. No pleural effusion. Patchy opacity at the right greater than left lung bases, slightly increased on the right. No pulmonary edema. IMPRESSION: Patchy opacity at the right greater than left lung bases, slightly increased on the right, suspect  multilobar pneumonia, potentially due to aspiration. Recommend follow-up chest radiographs to resolution. Electronically Signed   By: Delbert Phenix M.D.   On: 08/28/2017 20:37    My personal review of EKG: Rhythm NSR   Assessment & Plan:    Active Problems:   HCAP (healthcare-associated pneumonia)  1. Healthcare associated pneumonia-chest x-ray shows multilobar pneumonia, will start patient on vancomycin and Zosyn.  Follow blood culture results.  Will obtain strep urinary antigen. 2. Hypercalcemia-patient's corrected calcium was 12.0.  Unclear etiology.  Will obtain PTH, vitamin D, PTH RP,SPEP.  Start on gentle IV hydration with normal saline.  Follow serum calcium level in a.m. 3. CAD-stable, continue Lipitor, Coreg, Plavix    DVT Prophylaxis-   Lovenox  AM Labs Ordered, also please review Full Orders  Family Communication: Admission, patients condition and plan of care including tests being ordered have been discussed with the patient  who indicate understanding and agree with the plan and Code Status.  Code Status: DNR  Admission status: Inpatient  Time spent in minutes : 60 minutes   Meredeth IdeGagan S Kyoko Elsea M.D on 08/28/2017 at 9:38 PM  Between 7am to 7pm - Pager - [phone]. After 7pm go to [email or web address] - password [email or web address]  [facility] - Office  [phone]

## 2017-08-28 NOTE — ED Notes (Signed)
Patient to Radiology

## 2017-08-28 NOTE — ED Notes (Signed)
Report given to receiving RN.

## 2017-08-28 NOTE — Progress Notes (Signed)
Pharmacy Antibiotic Note  Alex StackCharles R Williamson is a 84 y.o. male admitted on 08/28/2017 with pneumonia.  Pharmacy has been consulted for Vancomycin and Zosyn dosing.  Initial doses ordered in ED.  Plan: Vancomycin 500mg  IV every 24 hours.  Goal trough 15-20 mcg/mL.  Zosyn 3.375gm IV every 8 hours. Follow-up micro data, labs, vitals.   Height: 5\' 11"  (180.3 cm) Weight: 125 lb (56.7 kg) IBW/kg (Calculated) : 75.3  Temp (24hrs), Avg:99.2 F (37.3 C), Min:98.1 F (36.7 C), Max:100.2 F (37.9 C)  Recent Labs  Lab 08/25/17 1150 08/28/17 1838  WBC 9.0 15.0*  CREATININE 1.50* 1.70*    Estimated Creatinine Clearance: 24.1 mL/min (A) (by C-G formula based on SCr of 1.7 mg/dL (H)).    Allergies  Allergen Reactions  . Remeron [Mirtazapine] Other (See Comments)    "feels Loopy"    Antimicrobials this admission: Vanc 6/24 >>   Zosyn 6/24 >>  Rocephin 6/24 x 1  Dose adjustments this admission: n/a   Microbiology results: 6/24 BCx: pending  UCx:    Sputum:    MRSA PCR:   Thank you for allowing pharmacy to be a part of this patient's care.  Mady GemmaHayes, Anilah Huck R 08/28/2017 9:39 PM

## 2017-08-28 NOTE — ED Provider Notes (Addendum)
[facility] EMERGENCY DEPARTMENT Provider Note   CSN: [id number] Arrival date & time: 08/28/17  1645     History   Chief Complaint Chief Complaint  Patient presents with  . Failure To Thrive    HPI Alex Williamson is a 84 y.o. male.  He is brought here from [facility] with concerns for dehydration.  Apparently had multiple dental extractions last month and has not been eating well since then.  Also sounds that he is not drinking very much.  He is brought by staff who is giving much of the history.  She states that normally he has been ambulatory without any assistance but would be more recently requiring assistance and has been more generalized weak.  There have also been multiple contacts at the facility with pneumonia so they also want checked out for pneumonia.  The patient himself denies any complaints other than he can eat.  When asked why he states because he does not have any teeth.  Level 5 caveat secondary to dementia.  The history is provided by the patient and a caregiver.  Weakness  Primary symptoms include no focal weakness. This is a new problem. The current episode started more than 1 week ago. The problem has been gradually worsening. There was no focality noted. There has been no fever. Pertinent negatives include no shortness of breath, no chest pain, no vomiting, no altered mental status, no confusion and no headaches. There were no medications administered prior to arrival.    Past Medical History:  Diagnosis Date  . Anxiety   . Arteriosclerotic cardiovascular disease (ASCVD) 1982   Acute MI in 1982; returned with non-ST segment elevation MI in 07/2010; emergency CABG-Dr. Laneta Simmers  . Benign prostate hyperplasia   . Cerebrovascular disease 2005   left parietal CVA  . Chronic kidney disease, stage 2, mildly decreased GFR   . Depression   . Dysphagia   . GERD (gastroesophageal reflux disease)   . Hyperlipidemia   . Hypertension   . Macular degeneration    +  cataract; legally blind  . Stroke [facility])    2nd stroke in May 2017  . Vascular dementia     Patient Active Problem List   Diagnosis Date Noted  . Osteoarthritis 03/10/2017  . CVA (cerebral vascular accident) (HCC) 07/10/2015  . Dry mouth 10/06/2011  . Encounter for screening colonoscopy 09/07/2011  . Hallucination 06/28/2011  . Ingrown toenail 06/28/2011  . Urinary incontinence 05/02/2011  . Constipation 05/02/2011  . Dysphagia 03/16/2011  . GAD (generalized anxiety disorder) 02/09/2011  . Depression 02/09/2011  . Abnormal sputum amount 12/19/2010  . GERD (gastroesophageal reflux disease)   . Cerebrovascular disease   . Macular degeneration   . Arteriosclerotic cardiovascular disease (ASCVD)   . Chronic kidney disease, stage 2, mildly decreased GFR   . Vascular dementia 02/06/2008  . Hyperlipidemia 11/01/2007  . Essential hypertension 11/01/2007  . BENIGN PROSTATIC HYPERTROPHY 11/01/2007    Past Surgical History:  Procedure Laterality Date  . CARDIAC SURGERY    . CORONARY ARTERY BYPASS GRAFT    . ESOPHAGOGASTRODUODENOSCOPY  09/20/11   schatzki's ring/hiatal hernia/antal erosion. bx showed gastritis but no h.pylori  . heart bypass  08/2010   [facility]  . HERNIA REPAIR    . MALONEY DILATION  09/20/2011   Procedure: Elease Hashimoto DILATION;  Surgeon: Corbin Ade, MD;  Location[facility] ENDO [facility];  Service: Endoscopy;  Laterality: N/A;  . SAVORY DILATION  09/20/2011   Procedure: SAVORY DILATION;  Surgeon:  Corbin Adeobert M Rourk, MD;  Location: [facility] ENDO SUITE;  Service: Endoscopy;  Laterality: N/A;  . TONSILLECTOMY          Home Medications    Prior to Admission medications   Medication Sig Start Date End Date Taking? Authorizing Provider  acetaminophen (TYLENOL) 500 MG tablet Take 1 tablet (500 mg total) by mouth 2 (two) times daily. FOR PAIN 03/10/17  Yes [place], Velna HatchetKawanta F, MD  amoxicillin-clavulanate (AUGMENTIN) 875-125 MG tablet Take 1 tablet by mouth 2 (two) times daily. 08/25/17  Yes  [place], Velna HatchetKawanta F, MD  atorvastatin (LIPITOR) 20 MG tablet TAKE 1 TABLET BY MOUTH AT BEDTIME. 06/19/17  Yes [place], Velna HatchetKawanta F, MD  carvedilol (COREG) 3.125 MG tablet TAKE 1 TABLET BY MOUTH TWICE DAILY. 06/19/17  Yes [place], Velna HatchetKawanta F, MD  clopidogrel (PLAVIX) 75 MG tablet TAKE 1 TABLET BY MOUTH AT BEDTIME. 06/19/17  Yes [place], Velna HatchetKawanta F, MD  dextromethorphan-guaiFENesin (ROBITUSSIN-DM) 10-100 MG/5ML liquid Take 5 mLs by mouth every 4 (four) hours as needed for cough.   Yes [provider]  Lactulose 20 GM/30ML SOLN Give 30ml po daily for constipation as needed Patient taking differently: Take 30 mLs by mouth daily as needed (for constipation).  08/20/14  Yes [place], Velna HatchetKawanta F, MD  Multiple Vitamin (DAILY VITES) tablet TAKE 1 TABLET BY MOUTH ONCE DAILY. 06/19/17  Yes [place], Velna HatchetKawanta F, MD  omeprazole (PRILOSEC) 20 MG capsule TAKE 1 CAPSULE BY MOUTH EACH MORNING. 06/19/17  Yes [place], Velna HatchetKawanta F, MD  oxybutynin (DITROPAN) 5 MG tablet TAKE 1 TABLET BY MOUTH TWICE DAILY. 12/20/16  Yes [place], Velna HatchetKawanta F, MD  oxybutynin (DITROPAN) 5 MG tablet Take 5 mg by mouth 2 (two) times daily. 12/14/11  Yes [place], Velna HatchetKawanta F, MD  QUEtiapine (SEROQUEL) 25 MG tablet TAKE 1 TABLET BY MOUTH AT BEDTIME. 06/19/17  Yes [place], Velna HatchetKawanta F, MD  ranitidine (ZANTAC) 150 MG tablet TAKE 1 TABLET BY MOUTH AT BEDTIME. 06/19/17  Yes [place], Velna HatchetKawanta F, MD  traMADol (ULTRAM) 50 MG tablet Take 1 tablet (50 mg total) by mouth every 12 (twelve) hours as needed. 03/10/17  Yes [place], Velna HatchetKawanta F, MD  triamcinolone cream (KENALOG) 0.1 % Apply 1 application topically 2 (two) times daily. Skin irritation Patient taking differently: Apply 1 application topically 2 (two) times daily as needed. Skin irritation 02/16/16  Yes [place], Velna HatchetKawanta F, MD  oxybutynin (DITROPAN) 5 MG tablet Take 1 tablet (5 mg total) by mouth 2 (two) times daily. 12/14/11 04/04/12  [place], Kawanta F, MD    Family History Family History  Problem Relation Age of Onset  .  Heart failure Brother     Social History Social History   Tobacco Use  . Smoking status: Never Smoker  . Smokeless tobacco: Never Used  Substance Use Topics  . Alcohol use: No  . Drug use: No     Allergies   Remeron [mirtazapine]   Review of Systems Review of Systems  Unable to perform ROS: Dementia  Respiratory: Negative for shortness of breath.   Cardiovascular: Negative for chest pain.  Gastrointestinal: Negative for vomiting.  Neurological: Positive for weakness. Negative for focal weakness and headaches.  Psychiatric/Behavioral: Negative for confusion.     Physical Exam Updated Vital Signs BP (!) 150/76   Pulse 75   Temp 98.1 F (36.7 C) (Oral)   Resp (!) 23   Ht 5\' 11"  (1.803 m)   Wt 56.7 kg (125 lb)   SpO2 93%   BMI 17.43 kg/m   Physical Exam  Constitutional: Vital signs are normal. He appears well-developed and well-nourished. He appears cachectic.  HENT:  Head: Normocephalic and atraumatic.  Right Ear: External ear normal.  Left Ear: External ear normal.  Nose: Nose normal.  Mouth/Throat: Mucous membranes are normal. Abnormal dentition (Multiple extractions on the lower no edema of the gums no lesions.). No uvula swelling. No oropharyngeal exudate or posterior oropharyngeal edema.  Eyes: Conjunctivae are normal. Right eye exhibits no discharge. Left eye exhibits no discharge. No scleral icterus.  Neck: Normal range of motion. Neck supple.  Cardiovascular: Normal rate, regular rhythm, normal heart sounds and intact distal pulses.  Pulmonary/Chest: Effort normal and breath sounds normal. He has no wheezes. He has no rales.  Abdominal: Soft. He exhibits no mass. There is no tenderness. There is no guarding.  Musculoskeletal: Normal range of motion. He exhibits no tenderness or deformity.  Neurological: He is alert. He has normal strength. He is disoriented (time). No sensory deficit. GCS eye subscore is 4. GCS verbal subscore is 5. GCS motor subscore is  6.  Skin: Skin is warm and dry. Capillary refill takes less than 2 seconds.  Psychiatric: He has a normal mood and affect.  Nursing note and vitals reviewed.    ED Treatments / Results  Labs (all labs ordered are listed, but only abnormal results are displayed) Labs Reviewed  CBC WITH DIFFERENTIAL/PLATELET - Abnormal; Notable for the following components:      Result Value   WBC 15.0 (*)    RBC 3.92 (*)    Hemoglobin 12.0 (*)    HCT 35.9 (*)    Neutro Abs 13.3 (*)    All other components within normal limits  COMPREHENSIVE METABOLIC PANEL - Abnormal; Notable for the following components:   Glucose, Bld 142 (*)    BUN 54 (*)    Creatinine, Ser 1.70 (*)    Calcium 11.5 (*)    Albumin 3.2 (*)    GFR calc non Af Amer 34 (*)    GFR calc Af Amer 40 (*)    All other components within normal limits    EKG EKG Interpretation  Date/Time:  Monday August 28 2017 19:00:28 EDT Ventricular Rate:  70 PR Interval:    QRS Duration: 86 QT Interval:  353 QTC Calculation: 381 R Axis:   51 Text Interpretation:  Sinus rhythm Borderline low voltage, extremity leads similar to prior ecg 12/18 Confirmed by Meridee Score [phone]) on 08/28/2017 7:24:25 PM   Radiology Dg Chest 2 View  Result Date: 08/28/2017 CLINICAL DATA:  Cough EXAM: CHEST - 2 VIEW COMPARISON:  08/25/2017 chest radiograph. FINDINGS: Intact sternotomy wires. Stable cardiomediastinal silhouette with normal heart size. No pneumothorax. No pleural effusion. Patchy opacity at the right greater than left lung bases, slightly increased on the right. No pulmonary edema. IMPRESSION: Patchy opacity at the right greater than left lung bases, slightly increased on the right, suspect multilobar pneumonia, potentially due to aspiration. Recommend follow-up chest radiographs to resolution. Electronically Signed   By: Delbert Phenix M.D.   On: 08/28/2017 20:37    Procedures .Critical Care Performed by: Terrilee Files, MD Authorized by:  Terrilee Files, MD   Critical care provider statement:    Critical care time (minutes):  30   Critical care time was exclusive of:  Separately billable procedures and treating other patients   Critical care was necessary to treat or prevent imminent or life-threatening deterioration of the following conditions:  Respiratory failure   Critical care was time  spent personally by me on the following activities:  Development of treatment plan with patient or surrogate, evaluation of patient's response to treatment, examination of patient, obtaining history from patient or surrogate, ordering and performing treatments and interventions, ordering and review of laboratory studies, ordering and review of radiographic studies, pulse oximetry, re-evaluation of patient's condition and review of old charts   I assumed direction of critical care for this patient from another provider in my specialty: no     (including critical care time)  Medications Ordered in ED Medications - No data to display   Initial Impression / Assessment and Plan / ED Course  I have reviewed the triage vital signs and the nursing notes.  Pertinent labs & imaging results that were available during my care of the patient were reviewed by me and considered in my medical decision making (see chart for details).  Clinical Course as of Sep 09 907  Mon Aug 28, 2017  2053 Patient brought in here by staff member for not taking any p.o. and coughing.  It sounds like multiple other clients at the facility with pneumonia.  Here the patient has an elevated white count and chest x-ray concerning for multi lobar pneumonia.  I ordered him ceftriaxone and Zithromax   [MB]  2115 discussed with hospitalist Dr. Sharl Ma who would rather the patient get Vancomycin and  Zosyn and will admit him to their service.   [MB]    Clinical Course User Index [MB] Terrilee Files, MD     Final Clinical Impressions(s) / ED Diagnoses   Final diagnoses:    HCAP (healthcare-associated pneumonia)  Failure to thrive in adult    ED Discharge Orders    None       Terrilee Files, MD 08/30/17 0950    Terrilee Files, MD 09/08/17 [phone]

## 2017-08-28 NOTE — ED Triage Notes (Signed)
Pt from [facility]. Tech reports that pt hasnt been eating well for one month. Reports that lower teeth were removed approx one month ago. Normally can ambulate independently but now has generalized weakness and requires 2 assist w ambulation

## 2017-08-29 ENCOUNTER — Other Ambulatory Visit: Payer: Self-pay

## 2017-08-29 DIAGNOSIS — E860 Dehydration: Secondary | ICD-10-CM

## 2017-08-29 DIAGNOSIS — I10 Essential (primary) hypertension: Secondary | ICD-10-CM

## 2017-08-29 DIAGNOSIS — N183 Chronic kidney disease, stage 3 unspecified: Secondary | ICD-10-CM

## 2017-08-29 DIAGNOSIS — J690 Pneumonitis due to inhalation of food and vomit: Secondary | ICD-10-CM

## 2017-08-29 LAB — COMPREHENSIVE METABOLIC PANEL
ALT: 22 U/L (ref 0–44)
ANION GAP: 8 (ref 5–15)
AST: 21 U/L (ref 15–41)
Albumin: 2.3 g/dL — ABNORMAL LOW (ref 3.5–5.0)
Alkaline Phosphatase: 52 U/L (ref 38–126)
BUN: 47 mg/dL — ABNORMAL HIGH (ref 8–23)
CHLORIDE: 111 mmol/L (ref 98–111)
CO2: 25 mmol/L (ref 22–32)
Calcium: 10.3 mg/dL (ref 8.9–10.3)
Creatinine, Ser: 1.61 mg/dL — ABNORMAL HIGH (ref 0.61–1.24)
GFR calc Af Amer: 42 mL/min — ABNORMAL LOW (ref >60)
GFR calc non Af Amer: 37 mL/min — ABNORMAL LOW (ref >60)
Glucose, Bld: 129 mg/dL — ABNORMAL HIGH (ref 70–99)
POTASSIUM: 3.9 mmol/L (ref 3.5–5.1)
SODIUM: 144 mmol/L (ref 135–145)
Total Bilirubin: 0.5 mg/dL (ref 0.3–1.2)
Total Protein: 6 g/dL — ABNORMAL LOW (ref 6.5–8.1)

## 2017-08-29 LAB — CBC
HCT: 30.5 % — ABNORMAL LOW (ref 39.0–52.0)
Hemoglobin: 9.9 g/dL — ABNORMAL LOW (ref 13.0–17.0)
MCH: 29.8 pg (ref 26.0–34.0)
MCHC: 32.5 g/dL (ref 30.0–36.0)
MCV: 91.9 fL (ref 78.0–100.0)
PLATELETS: 254 10*3/uL (ref 150–400)
RBC: 3.32 MIL/uL — AB (ref 4.22–5.81)
RDW: 13 % (ref 11.5–15.5)
WBC: 12.1 10*3/uL — ABNORMAL HIGH (ref 4.0–10.5)

## 2017-08-29 LAB — MRSA PCR SCREENING: MRSA by PCR: NEGATIVE

## 2017-08-29 LAB — STREP PNEUMONIAE URINARY ANTIGEN: STREP PNEUMO URINARY ANTIGEN: NEGATIVE

## 2017-08-29 MED ORDER — SODIUM CHLORIDE 0.9 % IV SOLN
3.0000 g | Freq: Two times a day (BID) | INTRAVENOUS | Status: DC
  Administered 2017-08-29: 3 g via INTRAVENOUS
  Filled 2017-08-29 (×4): qty 3

## 2017-08-29 MED ORDER — SODIUM CHLORIDE 0.9 % IV SOLN
INTRAVENOUS | Status: AC
  Filled 2017-08-29: qty 3

## 2017-08-29 MED ORDER — ENSURE ENLIVE PO LIQD
237.0000 mL | Freq: Two times a day (BID) | ORAL | Status: DC
  Administered 2017-08-29 – 2017-08-30 (×2): 237 mL via ORAL

## 2017-08-29 NOTE — Progress Notes (Signed)
Patient made n.p.o. with SLP evaluation in a.m. due to coughing and choking sensation with his feeding supplements.

## 2017-08-29 NOTE — Progress Notes (Signed)
Pt IV infiltrated, removed. Multiple RN's attempted to stick patient, no success. MD made aware. After assessment of patient, MD will decide if patient needs PICC or Midline access. Will continue to monitor.

## 2017-08-29 NOTE — Progress Notes (Signed)
Pharmacy Antibiotic Note  Alex Williamson is a 84 y.o. male admitted on 08/28/2017 with aspiration pneumonia. Pharmacy has been consulted for Unasyn dosing.  Plan: Unasyn 3g IV every 12 hours Follow-up micro data, labs, vitals.   Height: 5\' 11"  (180.3 cm) Weight: 122 lb 2.2 oz (55.4 kg) IBW/kg (Calculated) : 75.3  Temp (24hrs), Avg:98.7 F (37.1 C), Min:97.6 F (36.4 C), Max:100.3 F (37.9 C)  Recent Labs  Lab 08/25/17 1150 08/28/17 1838 08/29/17 0458  WBC 9.0 15.0* 12.1*  CREATININE 1.50* 1.70* 1.61*    Estimated Creatinine Clearance: 24.9 mL/min (A) (by C-G formula based on SCr of 1.61 mg/dL (H)).    Allergies  Allergen Reactions  . Remeron [Mirtazapine] Other (See Comments)    "feels Loopy"    Antimicrobials this admission: Unasyn 6/25>> Vanc 6/24 >> 6/25 Zosyn 6/24 >> 6/25 Rocephin 6/24 x 1  Dose adjustments this admission: n/a   Microbiology results: 6/24 BCx: ngtd  MRSA PCR: negative  Thank you for allowing pharmacy to be a part of this patient's care.  Tad MooreSteven C Lareen Mullings 08/29/2017 6:31 PM

## 2017-08-29 NOTE — Progress Notes (Signed)
Patient took meds whole with applesauce. Tolerated well. Patient stated that he drinks chocolate milk with every meal. Patient given Ensure in ice. Patient swallowed loudly, coughing after each swallow. Patient was sitting upright on edge of bed when this was occurring. Patient had coughed earlier before getting PO, but not as frequently. Patient coughing after every swallow of thin liquid. Patient was not able to tell this RN if food or drinks are altered at [facility]. HOB remains at 30 degrees. Sherryll BurgerShah, MD notified. Patient NPO now with SLP consult. [place], NT notified. Will pass on in RN shift report. Will continue to monitor.

## 2017-08-29 NOTE — Evaluation (Signed)
Clinical/Bedside Swallow Evaluation Patient Details  Name: Alex Williamson MRN: [id number] Date of Birth: 1927-07-28  Today's Date: 08/29/2017 Time: SLP Start Time (ACUTE ONLY): 1045 SLP Stop Time (ACUTE ONLY): 1122 SLP Time Calculation (min) (ACUTE ONLY): 37 min  Past Medical History:  Past Medical History:  Diagnosis Date  . Anxiety   . Arteriosclerotic cardiovascular disease (ASCVD) 1982   Acute MI in 1982; returned with non-ST segment elevation MI in 07/2010; emergency CABG-Dr. Laneta Simmers  . Benign prostate hyperplasia   . Cerebrovascular disease 2005   left parietal CVA  . Chronic kidney disease, stage 2, mildly decreased GFR   . Depression   . Dysphagia   . GERD (gastroesophageal reflux disease)   . Hyperlipidemia   . Hypertension   . Macular degeneration    + cataract; legally blind  . Stroke [facility])    2nd stroke in May 2017  . Vascular dementia    Past Surgical History:  Past Surgical History:  Procedure Laterality Date  . CARDIAC SURGERY    . CORONARY ARTERY BYPASS GRAFT    . ESOPHAGOGASTRODUODENOSCOPY  09/20/11   schatzki's ring/hiatal hernia/antal erosion. bx showed gastritis but no h.pylori  . heart bypass  08/2010   [facility]  . HERNIA REPAIR    . MALONEY DILATION  09/20/2011   Procedure: Elease Hashimoto DILATION;  Surgeon: Corbin Ade, MD;  Location[facility] END[facility];  Service: Endoscopy;  Laterality: N/A;  . SAVORY DILATION  09/20/2011   Procedure: SAVORY DILATION;  Surgeon: Corbin Ade, MD;  Location[facility] END[facility];  Service: Endoscopy;  Laterality: N/A;  . TONSILLECTOMY     HPI:  84 year old male with a history of stroke, CAD, hypertension, hyperlipidemia, vascular dementia presenting with failure to thrive type symptoms from his assisted living facility, [name].  The patient is a poor historian.  Apparently, the patient has had decreased oral intake since having multiple dental extractions approximately 1 month prior to this admission.  The staff noted the patient  to have increasing generalized weakness requiring more assistance.  The patient is also been complaining of a nonproductive cough.  He denies any chest pain, nausea, vomiting, diarrhea, abdominal pain. The patient himself denies any complaints other than he can eat.  When asked why he states because he does not have any teeth.  The patient was found to have WBC 15.0 with chest x-ray showing bibasilar opacities, right greater than left.  The patient was started on IV antibiotics for presumptive aspiration pneumonia.  Speech therapy was consulted to assist with management.   Assessment / Plan / Recommendation Clinical Impression  Clinical swallow evaluation completed at bedside. Pt with mild dysphonia/reduced vocal intensity which he states is following a stroke about a year ago. He has been a resident at [name] for ~1 year. He reports that he fell recently in the bathroom and no one found for him for three hours. He also reports that he has had difficulty swallowing liquids for "17 days". Pt recently had three lower teeth removed, however he states that he is not experiencing pain from this. Pt endorses difficulty initiating swallow and this appeared to be true as evidenced by prolonged oral holding, suspected delay in swallow initiation, and slight grimmace with swallow. Pt with audible signs of reduced airway protection characterized by delayed and immediate coughing following thins and occasionally with thin. Pt demonstrated good tolerance of puree and HTL so will initiate today and plan for MBSS tomorrow. Above d/w RN. PO meds crushed  in puree as able.   SLP Visit Diagnosis: Dysphagia, oropharyngeal phase (R13.12)    Aspiration Risk  Moderate aspiration risk;Risk for inadequate nutrition/hydration    Diet Recommendation Dysphagia 1 (Puree);Honey-thick liquid   Liquid Administration via: Cup;Spoon Medication Administration: Crushed with puree Supervision: Patient able to self feed;Intermittent  supervision to cue for compensatory strategies Compensations: Small sips/bites Postural Changes: Seated upright at 90 degrees;Remain upright for at least 30 minutes after po intake    Other  Recommendations Oral Care Recommendations: Oral care BID;Staff/trained caregiver to provide oral care Other Recommendations: Order thickener from pharmacy;Prohibited food (jello, ice cream, thin soups);Remove water pitcher;Clarify dietary restrictions   Follow up Recommendations Skilled Nursing facility      Frequency and Duration min 2x/week  1 week       Prognosis Prognosis for Safe Diet Advancement: Fair Barriers to Reach Goals: Severity of deficits      Swallow Study   General Date of Onset: 08/28/17 HPI: 84 year old male with a history of stroke, CAD, hypertension, hyperlipidemia, vascular dementia presenting with failure to thrive type symptoms from his assisted living facility, [name].  The patient is a poor historian.  Apparently, the patient has had decreased oral intake since having multiple dental extractions approximately 1 month prior to this admission.  The staff noted the patient to have increasing generalized weakness requiring more assistance.  The patient is also been complaining of a nonproductive cough.  He denies any chest pain, nausea, vomiting, diarrhea, abdominal pain. The patient himself denies any complaints other than he can eat.  When asked why he states because he does not have any teeth.  The patient was found to have WBC 15.0 with chest x-ray showing bibasilar opacities, right greater than left.  The patient was started on IV antibiotics for presumptive aspiration pneumonia.  Speech therapy was consulted to assist with management. Type of Study: Bedside Swallow Evaluation Previous Swallow Assessment: None on record Diet Prior to this Study: NPO Temperature Spikes Noted: No Respiratory Status: Room air History of Recent Intubation: No Behavior/Cognition:  Alert;Cooperative;Pleasant mood Oral Cavity Assessment: Excessive secretions Oral Care Completed by SLP: Yes Oral Cavity - Dentition: Dentures, top;Edentulous Vision: Functional for self-feeding Self-Feeding Abilities: Able to feed self Patient Positioning: Upright in bed Baseline Vocal Quality: Low vocal intensity(mild dysphonia) Volitional Cough: Strong;Congested Volitional Swallow: Able to elicit(difficult to initiate per Pt)    Oral/Motor/Sensory Function Overall Oral Motor/Sensory Function: Within functional limits   Ice Chips Ice chips: Impaired Presentation: Spoon Oral Phase Functional Implications: Prolonged oral transit;Oral holding Pharyngeal Phase Impairments: Suspected delayed Swallow;Cough - Delayed   Thin Liquid Thin Liquid: Impaired Presentation: Cup;Spoon;Self Fed Oral Phase Functional Implications: Prolonged oral transit Pharyngeal  Phase Impairments: Suspected delayed Swallow;Cough - Immediate;Cough - Delayed    Nectar Thick Nectar Thick Liquid: Impaired Presentation: Cup;Spoon Pharyngeal Phase Impairments: Suspected delayed Swallow;Cough - Delayed   Honey Thick Honey Thick Liquid: Within functional limits Presentation: Cup;Self fed;Spoon   Puree Puree: Within functional limits Presentation: Self Fed;Spoon   Solid   Thank you,  Havery MorosDabney Welcome Fults, CCC-SLP [phone]    Solid: Not tested        Izell Labat 08/29/2017,12:08 PM

## 2017-08-29 NOTE — Clinical Social Work Note (Signed)
Clinical Social Work Assessment  Patient Details  Name: Alex StackCharles R Williamson MRN: [id number] Date of Birth: 02/11/1928  Date of referral:  08/29/17               Reason for consult:  Other (Comment Required)(From [facility])                Permission sought to share information with:    Permission granted to share information::     Name::        Agency::  Tammy at [facility]  Relationship::     Contact Information:     Housing/Transportation Living arrangements for the past 2 months:  Assisted Living Facility Source of Information:  Facility Patient Interpreter Needed:  None Criminal Activity/Legal Involvement Pertinent to Current Situation/Hospitalization:  No - Comment as needed Significant Relationships:  Adult Children Lives with:  Facility Resident Do you feel safe going back to the place where you live?  Yes Need for family participation in patient care:  Yes (Comment)  Care giving concerns:  Facility resident. No concerns identified.    Social Worker assessment / plan:  Patinet ambulates with a cane and is unsteady sometimes. Patient is incontinent of both bowel and bladder. Staff assists patient with bathing, dressing and toileting. Patient is [facility] a [facility] diet at [facility]. He can return at discharge.   Employment status:  Retired Database administratornsurance information:  Managed Medicare PT Recommendations:    Information / Referral to community resources:     Patient/Family's Response to care:  Patient is a long term resident at the facility.    Patient/Family's Understanding of and Emotional Response to Diagnosis, Current Treatment, and Prognosis:  Tammy at the facility notified family of patient's status.   Emotional Assessment Appearance:  Appears stated age Attitude/Demeanor/Rapport:  Unable to Assess Affect (typically observed):    Orientation:  Oriented to Self, Oriented to Place Alcohol / Substance use:  Not Applicable Psych involvement (Current and /or in the  community):  No (Comment)  Discharge Needs  Concerns to be addressed:  Other (Comment Required(return to ALF) Readmission within the last 30 days:  No Current discharge risk:  None Barriers to Discharge:  No Barriers Identified   Annice NeedySettle, Luci Bellucci D, LCSW 08/29/2017, 5:19 PM

## 2017-08-29 NOTE — Progress Notes (Signed)
PROGRESS NOTE  Alex Williamson ZOX:[id number] DOB: September 22, 1927 DOA: 08/28/2017 PCP: Salley Scarlet, MD  Brief History:  84 year old male with a history of stroke, CAD, hypertension, hyperlipidemia, vascular dementia presenting with 2-week history of coughing, shortness of breath, and generalized weakness from his assisted living facility, [facility].  Apparently, the patient was confused at the time of arrival.  His caretakers at  [facility] relate recent decreased oral intake and generalized weakness to the point of difficulty ambulating.  This resulted in a mechanical fall in the bathroom approximately 3 days prior to this admission.  Apparently, the patient has had decreased oral intake since having multiple dental extractions approximately 1 month prior to this admission.     He denies any chest pain, nausea, vomiting, diarrhea, abdominal pain. The patient himself denies any complaints other than he can eat.  When asked why he states because he does not have any teeth.  The patient was found to have WBC 15.0 with chest x-ray showing bibasilar opacities, right greater than left.  The patient was started on IV antibiotics for presumptive aspiration pneumonia.  Speech therapy was consulted to assist with management.  Assessment/Plan: Aspiration Pneumonia -personally reviewed CXR--R-basilar opacity -Discontinue vancomycin/zosyn -start unasyn -Speech therapy evaluation-->Dys 1 with honey thickened liquids -MBS scheduled 6/26  CKD stage III -Baseline creatinine 1.3-1.5 -A.m. BMP  Dehydration -Continue IV fluids -Presenting creatinine 1.70  Hypercalcemia -Intact PTH -Continue IV fluids -Corrected calcium 12.1 at the time of admission -check SPEP and immunofixation in pt with globulin gap  Essential hypertension -Continue carvedilol  Hyperlipidemia -Continue statin  Coronary artery disease -No chest pain presently -Continue carvedilol, Plavix, statin   Disposition  Plan:  ALF  in 2-3 days  Family Communication:   No Family at bedside  Consultants:  none  Code Status:   DNR  DVT Prophylaxis:   Lovenox   Procedures: As Listed in Progress Note Above  Antibiotics: Vanco/zosyn 6/24>>>6/25 unasyn 6/25>>>    Subjective: Patient continues to complain of coughing and shortness of breath although it is a little improved.  He denies any chest pain, nausea, vomiting, diarrhea, abdominal pain.  He complains of generalized weakness.  Objective: Vitals:   08/28/17 2200 08/28/17 2213 08/28/17 2232 08/29/17 0551  BP: 108/61  119/64 101/63  Pulse: 63  61 62  Resp: (!) 21     Temp:  98.4 F (36.9 C) 98.9 F (37.2 C) 97.6 F (36.4 C)  TempSrc:  Oral Oral Oral  SpO2: 93%  99% 95%  Weight:   55.4 kg (122 lb 2.2 oz)   Height:        Intake/Output Summary (Last 24 hours) at 08/29/2017 1121 Last data filed at 08/29/2017 0602 Gross per 24 hour  Intake 862.5 ml  Output -  Net 862.5 ml   Weight change:  Exam:   General:  Pt is alert, follows commands appropriately, not in acute distress  HEENT: No icterus, No thrush, No neck mass, /AT  Cardiovascular: RRR, S1/S2, no rubs, no gallops  Respiratory: Bilateral scattered rales, right greater than left.  No wheezing.  Good air movement.  Abdomen: Soft/+BS, non tender, non distended, no guarding  Extremities: No edema, No lymphangitis, No petechiae, No rashes, no synovitis   Data Reviewed: I have personally reviewed following labs and imaging studies Basic Metabolic Panel: Recent Labs  Lab 08/25/17 1150 08/28/17 1838 08/29/17 0458  NA 141 141 144  K 5.0 5.0 3.9  CL 105 106 111  CO2 28 24 25   GLUCOSE 108* 142* 129*  BUN 40* 54* 47*  CREATININE 1.50* 1.70* 1.61*  CALCIUM 10.9* 11.5* 10.3   Liver Function Tests: Recent Labs  Lab 08/25/17 1150 08/28/17 1838 08/29/17 0458  AST 23 26 21   ALT 27 27 22   ALKPHOS  --  63 52  BILITOT 0.5 0.6 0.5  PROT 6.8 7.8 6.0*  ALBUMIN  --   3.2* 2.3*   No results for input(s): LIPASE, AMYLASE in the last 168 hours. No results for input(s): AMMONIA in the last 168 hours. Coagulation Profile: No results for input(s): INR, PROTIME in the last 168 hours. CBC: Recent Labs  Lab 08/25/17 1150 08/28/17 1838 08/29/17 0458  WBC 9.0 15.0* 12.1*  NEUTROABS 6,165 13.3*  --   HGB 11.5* 12.0* 9.9*  HCT 33.6* 35.9* 30.5*  MCV 86.6 91.6 91.9  PLT 340 300 254   Cardiac Enzymes: No results for input(s): CKTOTAL, CKMB, CKMBINDEX, TROPONINI in the last 168 hours. BNP: Invalid input(s): POCBNP CBG: No results for input(s): GLUCAP in the last 168 hours. HbA1C: No results for input(s): HGBA1C in the last 72 hours. Urine analysis:    Component Value Date/Time   COLORURINE YELLOW 08/28/2017 2000   APPEARANCEUR HAZY (A) 08/28/2017 2000   LABSPEC 1.027 08/28/2017 2000   PHURINE 5.0 08/28/2017 2000   GLUCOSEU NEGATIVE 08/28/2017 2000   HGBUR NEGATIVE 08/28/2017 2000   HGBUR negative 02/06/2008 0908   BILIRUBINUR NEGATIVE 08/28/2017 2000   BILIRUBINUR negative 05/31/2011 1631   KETONESUR NEGATIVE 08/28/2017 2000   PROTEINUR NEGATIVE 08/28/2017 2000   UROBILINOGEN 0.2 05/31/2011 1631   UROBILINOGEN 0.2 02/06/2008 0908   NITRITE NEGATIVE 08/28/2017 2000   LEUKOCYTESUR NEGATIVE 08/28/2017 2000   Sepsis Labs: @LABRCNTIP (procalcitonin:4,lacticidven:4) ) Recent Results (from the past 240 hour(s))  Culture, blood (routine x 2)     Status: None (Preliminary result)   Collection Time: 08/28/17  8:59 PM  Result Value Ref Range Status   Specimen Description BLOOD  Final   Special Requests NONE  Final   Culture   Final    NO GROWTH < 12 HOURS Performed at [facility], [address] [place], [place] [address]    Report Status PENDING  Incomplete  Culture, blood (routine x 2)     Status: None (Preliminary result)   Collection Time: 08/28/17  9:04 PM  Result Value Ref Range Status   Specimen Description BLOOD  Final   Special  Requests NONE  Final   Culture   Final    NO GROWTH < 12 HOURS Performed at [facility], [address] [place], [place] [address]    Report Status PENDING  Incomplete  MRSA PCR Screening     Status: None   Collection Time: 08/28/17 11:31 PM  Result Value Ref Range Status   MRSA by PCR NEGATIVE NEGATIVE Final    Comment:        The GeneXpert MRSA Assay (FDA approved for NASAL specimens only), is one component of a comprehensive MRSA colonization surveillance program. It is not intended to diagnose MRSA infection nor to guide or monitor treatment for MRSA infections. Performed at [facility], [address] [place], [place] [address]      Scheduled Meds: . atorvastatin  20 mg Oral QHS  . carvedilol  3.125 mg Oral BID WC  . clopidogrel  75 mg Oral QHS  . enoxaparin (LOVENOX) injection  30 mg Subcutaneous Q24H  . famotidine  20  mg Oral Daily  . feeding supplement (ENSURE ENLIVE)  237 mL Oral BID BM  . oxybutynin  5 mg Oral BID  . pantoprazole  40 mg Oral Daily  . QUEtiapine  25 mg Oral QHS   Continuous Infusions: . sodium chloride 75 mL/hr at 08/29/17 0702  . piperacillin-tazobactam (ZOSYN)  IV Stopped (08/29/17 1001)    Procedures/Studies: Dg Chest 2 View  Result Date: 08/28/2017 CLINICAL DATA:  Cough EXAM: CHEST - 2 VIEW COMPARISON:  08/25/2017 chest radiograph. FINDINGS: Intact sternotomy wires. Stable cardiomediastinal silhouette with normal heart size. No pneumothorax. No pleural effusion. Patchy opacity at the right greater than left lung bases, slightly increased on the right. No pulmonary edema. IMPRESSION: Patchy opacity at the right greater than left lung bases, slightly increased on the right, suspect multilobar pneumonia, potentially due to aspiration. Recommend follow-up chest radiographs to resolution. Electronically Signed   By: Delbert Phenix M.D.   On: 08/28/2017 20:37   Dg Chest 2 View  Result Date: 08/25/2017 CLINICAL DATA:  Cough.  Rule out aspiration  pneumonia EXAM: CHEST - 2 VIEW COMPARISON:  02/27/2017 FINDINGS: Right lower lobe infiltrate, consistent with pneumonia. Mild patchy airspace disease in the lingula also likely pneumonia. COPD with hyperinflation. CABG changes. Negative for heart failure. Minimal right pleural effusion IMPRESSION: COPD with right lower lobe pneumonia. Small area of lingular pneumonia. Electronically Signed   By: Marlan Palau M.D.   On: 08/25/2017 12:45    Catarina Hartshorn, DO  [facility] Pager [phone]  If 7PM-7AM, please contact night-coverage [email or web address] Password [email or web address] 08/29/2017, 11:21 AM   LOS: 1 day

## 2017-08-29 NOTE — Progress Notes (Signed)
Initial Nutrition Assessment  DOCUMENTATION CODES:   Severe malnutrition in context of chronic illness, Underweight  INTERVENTION:  Discontinue Ensure Enlive BID  Provide Magic cup TID with meals, each supplement provides 290 kcal and 9 grams of protein    NUTRITION DIAGNOSIS:   Severe Malnutrition related to chronic illness, dysphagia, poor appetite as evidenced by per patient/family report, percent weight loss, severe muscle and fat loss noted below.   GOAL:   Patient will meet greater than or equal to 90% of their needs(If feasible given his age and health status. )  MONITOR:   PO intake, Weight trends, Labs, Supplement acceptance    REASON FOR ASSESSMENT:   Malnutrition Screening Tool    ASSESSMENT: the patient presents from [facility] with hx of GERD, HTN, HLD, CKD-2, vascular dementia, macular degeneration, stroke and dysphagia.   Nursing observed pt coughing with liquids and his diet was held until Speech therapy evaluated swallow function. Now his diet has resumed and downgraded to pureed textures and honey thick liquids. Able to feed himself with cueing per ST. His intake is very poor. Lunch observed today 0% of meal but had consumed 100% of his magic cup.   He is underweight with a hx of wt loss (28 lbs) 19% in 6 months which is clinically signifcant. He weighed 150 lb in December of 2018. NFPE - Patient has mild muscle depletion to temples and dorsal, severe loss to clavicle, acromion patellar, quadriceps and calves. Moderate to severe subcutaneous fat loss orbital, buccal, triceps and thoracic regions.   Labs:  BMP Latest Ref Rng & Units 08/30/2017 08/29/2017 08/28/2017  Glucose 70 - 99 mg/dL 97 829(F) 621(H)  BUN 8 - 23 mg/dL 08(M) 57(Q) 46(N)  Creatinine 0.61 - 1.24 mg/dL 6.29(B) 2.84(X) 3.24(M)  BUN/Creat Ratio 6 - 22 (calc) - - -  Sodium 135 - 145 mmol/L 145 144 141  Potassium 3.5 - 5.1 mmol/L 3.4(L) 3.9 5.0  Chloride 98 - 111 mmol/L 112(H) 111 106  CO2 22  - 32 mmol/L 25 25 24   Calcium 8.9 - 10.3 mg/dL 01.0 27.2 11.5(H)    Meds reviewed: Pepcid, Protonix, Lipitor, Seroquel   NUTRITION - FOCUSED PHYSICAL EXAM:    Most Recent Value  Orbital Region  Moderate depletion  Upper Arm Region  Severe depletion  Thoracic and Lumbar Region  Severe depletion  Buccal Region  Moderate depletion  Temple Region  Mild depletion  Clavicle Bone Region  Severe depletion  Clavicle and Acromion Bone Region  Severe depletion  Patellar Region  Severe depletion  Anterior Thigh Region  Severe depletion  Posterior Calf Region  Severe depletion  Edema (RD Assessment)  None  Hair  Reviewed  Eyes  Reviewed  Mouth  Reviewed  Skin  Reviewed  Nails  Reviewed      Diet Order:   Diet Order           DIET - DYS 1 Room service appropriate? Yes; Fluid consistency: Honey Thick  Diet effective now          EDUCATION NEEDS:   Not appropriate for education at this timeSkin:  Skin Assessment: Reviewed RN Assessment  Last BM:  6/24   Height:   Ht Readings from Last 1 Encounters:  08/28/17 5\' 11"  (1.803 m)    Weight:   Wt Readings from Last 1 Encounters:  08/28/17 122 lb 2.2 oz (55.4 kg)    Ideal Body Weight:  78 kg  BMI:  Body mass index is 17.03 kg/m.  Estimated Nutritional Needs:   Kcal:  1700-1900  Protein:  65-70 gr  Fluid:  >1400 ml daily   Alex ShiversLynn Yeni Jiggetts MS,RD,CSG,LDN Office: [phone] Pager: [phone]

## 2017-08-29 NOTE — Progress Notes (Signed)
Spoke with staff from [facility], staff member to come insert PICC line this evening.

## 2017-08-30 ENCOUNTER — Encounter (HOSPITAL_COMMUNITY): Payer: Self-pay | Admitting: Primary Care

## 2017-08-30 ENCOUNTER — Ambulatory Visit: Payer: MEDICARE | Admitting: Family Medicine

## 2017-08-30 ENCOUNTER — Inpatient Hospital Stay (HOSPITAL_COMMUNITY): Payer: MEDICARE

## 2017-08-30 DIAGNOSIS — Z515 Encounter for palliative care: Secondary | ICD-10-CM

## 2017-08-30 DIAGNOSIS — R627 Adult failure to thrive: Secondary | ICD-10-CM

## 2017-08-30 DIAGNOSIS — Z7189 Other specified counseling: Secondary | ICD-10-CM

## 2017-08-30 DIAGNOSIS — E43 Unspecified severe protein-calorie malnutrition: Secondary | ICD-10-CM

## 2017-08-30 LAB — CBC
HEMATOCRIT: 29.9 % — AB (ref 39.0–52.0)
Hemoglobin: 9.5 g/dL — ABNORMAL LOW (ref 13.0–17.0)
MCH: 29.1 pg (ref 26.0–34.0)
MCHC: 31.8 g/dL (ref 30.0–36.0)
MCV: 91.4 fL (ref 78.0–100.0)
PLATELETS: 237 10*3/uL (ref 150–400)
RBC: 3.27 MIL/uL — ABNORMAL LOW (ref 4.22–5.81)
RDW: 13 % (ref 11.5–15.5)
WBC: 9.3 10*3/uL (ref 4.0–10.5)

## 2017-08-30 LAB — BASIC METABOLIC PANEL
ANION GAP: 8 (ref 5–15)
BUN: 32 mg/dL — AB (ref 8–23)
CHLORIDE: 112 mmol/L — AB (ref 98–111)
CO2: 25 mmol/L (ref 22–32)
Calcium: 10 mg/dL (ref 8.9–10.3)
Creatinine, Ser: 1.25 mg/dL — ABNORMAL HIGH (ref 0.61–1.24)
GFR calc non Af Amer: 50 mL/min — ABNORMAL LOW (ref >60)
GFR, EST AFRICAN AMERICAN: 57 mL/min — AB (ref >60)
GLUCOSE: 97 mg/dL (ref 70–99)
POTASSIUM: 3.4 mmol/L — AB (ref 3.5–5.1)
Sodium: 145 mmol/L (ref 135–145)

## 2017-08-30 MED ORDER — AMOXICILLIN-POT CLAVULANATE 875-125 MG PO TABS
1.0000 | ORAL_TABLET | Freq: Two times a day (BID) | ORAL | Status: AC
  Administered 2017-08-30 – 2017-09-06 (×16): 1 via ORAL
  Filled 2017-08-30 (×16): qty 1

## 2017-08-30 MED ORDER — ENOXAPARIN SODIUM 40 MG/0.4ML ~~LOC~~ SOLN
40.0000 mg | SUBCUTANEOUS | Status: DC
  Administered 2017-08-31 – 2017-09-05 (×6): 40 mg via SUBCUTANEOUS
  Filled 2017-08-30 (×7): qty 0.4

## 2017-08-30 MED ORDER — ADULT MULTIVITAMIN W/MINERALS CH
1.0000 | ORAL_TABLET | Freq: Every day | ORAL | Status: DC
  Administered 2017-08-30 – 2017-09-06 (×8): 1 via ORAL
  Filled 2017-08-30 (×9): qty 1

## 2017-08-30 NOTE — Progress Notes (Signed)
PROGRESS NOTE    Alex Williamson  XFG:[id number] DOB: 1927-04-11 DOA: 08/28/2017 PCP: Alycia Rossetti, MD     Brief Narrative:  Patient is an 84 year old man admitted from home on 6/24 with a 2-week history of cough, shortness of breath and generalized weakness from his assisted living facility.  He has a prior history of stroke, coronary artery disease, hypertension, hyperlipidemia and vascular dementia.  His caretakers relate recent decreased oral intake and generalized weakness to the point of difficulty ambulating.  This resulted in a mechanical fall approximately 3 days prior to admission.  The patient was found to have a WBC count of 15,000 with chest x-ray showing bibasilar opacities right greater than left.  He was started on antibiotics for presumed aspiration pneumonia and admission was requested.   Assessment & Plan:   Active Problems:   Essential hypertension   HCAP (healthcare-associated pneumonia)   Aspiration pneumonia of both lower lobes due to gastric secretions (HCC)   CKD (chronic kidney disease) stage 3, GFR 30-59 ml/min (HCC)   Hypercalcemia   Dehydration   Failure to thrive in adult   Palliative care by specialist   Goals of care, counseling/discussion   DNR (do not resuscitate) discussion   Protein-calorie malnutrition, severe   Aspiration pneumonia -This is recurrent. -Antibiotics were transitioned over to Unasyn on 6/25, as speech therapy has cleared him for diet we will further de-escalate antibiotics to Augmentin which he should take for 10 days. -I believe it is imperative that palliative care meet and discuss with patient as he has had a recurrent aspiration pneumonia and per speech therapy evaluation is at a very high risk for continued aspiration. -For now continue dysphagia 1 diet with honey thick liquids.  Chronic kidney disease stage III -Creatinine remains at his baseline of around 1.3-1.5.  Essential hypertension -Fair control, continue  carvedilol.  Hyperlipidemia -Continue statin  Coronary artery disease -Stable, no chest pain. -Continue carvedilol, Plavix, statin.  Hypercalcemia -Calcium is 10 today, corrected for albumin is 11.4 -Continue IV fluids at a rate of 75 cc an hour as patient does appear to be hypovolemic on exam. -SPEP and UPEP with immunofixation have been ordered, however even if he were to have multiple myeloma I doubt he would be a candidate for treatment given his advanced age and multiple comorbidities. -Continue to follow calcium levels.   DVT prophylaxis: Lovenox Code Status: DNR Family Communication: Patient only Disposition Plan: Back to ALF if they will accept him versus SNF  Consultants:   Palliative care  Procedures:   None  Antimicrobials:  Anti-infectives (From admission, onward)   Start     Dose/Rate Route Frequency Ordered Stop   08/30/17 1000  amoxicillin-clavulanate (AUGMENTIN) 875-125 MG per tablet 1 tablet     1 tablet Oral Every 12 hours 08/30/17 0949 09/07/17 0959   08/29/17 2200  vancomycin (VANCOCIN) 500 mg in sodium chloride 0.9 % 100 mL IVPB  Status:  Discontinued     500 mg 100 mL/hr over 60 Minutes Intravenous Every 24 hours 08/28/17 2143 08/29/17 0639   08/29/17 2200  Ampicillin-Sulbactam (UNASYN) 3 g in sodium chloride 0.9 % 100 mL IVPB  Status:  Discontinued     3 g 200 mL/hr over 30 Minutes Intravenous Every 12 hours 08/29/17 1830 08/30/17 0949   08/29/17 0600  piperacillin-tazobactam (ZOSYN) IVPB 3.375 g  Status:  Discontinued     3.375 g 12.5 mL/hr over 240 Minutes Intravenous Every 8 hours 08/28/17 2143 08/29/17 1803  08/28/17 2130  piperacillin-tazobactam (ZOSYN) IVPB 3.375 g     3.375 g 100 mL/hr over 30 Minutes Intravenous  Once 08/28/17 2115 08/28/17 2328   08/28/17 2130  vancomycin (VANCOCIN) IVPB 1000 mg/200 mL premix     1,000 mg 200 mL/hr over 60 Minutes Intravenous  Once 08/28/17 2115 08/29/17 0029   08/28/17 2100  cefTRIAXone (ROCEPHIN) 1 g  in sodium chloride 0.9 % 100 mL IVPB     1 g 200 mL/hr over 30 Minutes Intravenous  Once 08/28/17 2053 08/28/17 2151   08/28/17 2100  azithromycin (ZITHROMAX) 500 mg in sodium chloride 0.9 % 250 mL IVPB  Status:  Discontinued     500 mg 250 mL/hr over 60 Minutes Intravenous Every 24 hours 08/28/17 2053 08/28/17 2115       Subjective: Lying in bed, states he feels weak, not very interactive, appears withdrawn.  Objective: Vitals:   08/30/17 0636 08/30/17 1021 08/30/17 1443 08/30/17 1725  BP: 114/68 139/69 112/67 (!) 149/75  Pulse: 77  (!) 55 (!) 50  Resp: 18  16   Temp: 99.3 F (37.4 C)     TempSrc: Oral     SpO2: 92%  94% 94%  Weight:      Height:        Intake/Output Summary (Last 24 hours) at 08/30/2017 1806 Last data filed at 08/30/2017 1532 Gross per 24 hour  Intake 1781 ml  Output 575 ml  Net 1206 ml   Filed Weights   08/28/17 1801 08/28/17 2232  Weight: 56.7 kg (125 lb) 55.4 kg (122 lb 2.2 oz)    Examination:  General exam: Alert, awake, oriented x 2 to person and place, not to time Respiratory system: Normal respiratory effort, coarse bilateral breath sounds Cardiovascular system:RRR. No murmurs, rubs, gallops. Gastrointestinal system: Abdomen is nondistended, soft and nontender. No organomegaly or masses felt. Normal bowel sounds heard. Central nervous system: Alert and oriented. No focal neurological deficits. Extremities: No C/C/E, +pedal pulses Skin: No rashes, lesions or ulcers Psychiatry: Judgement and insight appear normal. Mood & affect appropriate.  Appears withdrawn    Data Reviewed: I have personally reviewed following labs and imaging studies  CBC: Recent Labs  Lab 08/25/17 1150 08/28/17 1838 08/29/17 0458 08/30/17 0444  WBC 9.0 15.0* 12.1* 9.3  NEUTROABS 6,165 13.3*  --   --   HGB 11.5* 12.0* 9.9* 9.5*  HCT 33.6* 35.9* 30.5* 29.9*  MCV 86.6 91.6 91.9 91.4  PLT 340 300 254 161   Basic Metabolic Panel: Recent Labs  Lab  08/25/17 1150 08/28/17 1838 08/29/17 0458 08/30/17 0444  NA 141 141 144 145  K 5.0 5.0 3.9 3.4*  CL 105 106 111 112*  CO2 '28 24 25 25  ' GLUCOSE 108* 142* 129* 97  BUN 40* 54* 47* 32*  CREATININE 1.50* 1.70* 1.61* 1.25*  CALCIUM 10.9* 11.5* 10.3 10.0   GFR: Estimated Creatinine Clearance: 32 mL/min (A) (by C-G formula based on SCr of 1.25 mg/dL (H)). Liver Function Tests: Recent Labs  Lab 08/25/17 1150 08/28/17 1838 08/29/17 0458  AST '23 26 21  ' ALT '27 27 22  ' ALKPHOS  --  63 52  BILITOT 0.5 0.6 0.5  PROT 6.8 7.8 6.0*  ALBUMIN  --  3.2* 2.3*   No results for input(s): LIPASE, AMYLASE in the last 168 hours. No results for input(s): AMMONIA in the last 168 hours. Coagulation Profile: No results for input(s): INR, PROTIME in the last 168 hours. Cardiac Enzymes: No results for input(s):  CKTOTAL, CKMB, CKMBINDEX, TROPONINI in the last 168 hours. BNP (last 3 results) No results for input(s): PROBNP in the last 8760 hours. HbA1C: No results for input(s): HGBA1C in the last 72 hours. CBG: No results for input(s): GLUCAP in the last 168 hours. Lipid Profile: No results for input(s): CHOL, HDL, LDLCALC, TRIG, CHOLHDL, LDLDIRECT in the last 72 hours. Thyroid Function Tests: No results for input(s): TSH, T4TOTAL, FREET4, T3FREE, THYROIDAB in the last 72 hours. Anemia Panel: No results for input(s): VITAMINB12, FOLATE, FERRITIN, TIBC, IRON, RETICCTPCT in the last 72 hours. Urine analysis:    Component Value Date/Time   COLORURINE YELLOW 08/28/2017 2000   APPEARANCEUR HAZY (A) 08/28/2017 2000   LABSPEC 1.027 08/28/2017 2000   PHURINE 5.0 08/28/2017 2000   GLUCOSEU NEGATIVE 08/28/2017 2000   HGBUR NEGATIVE 08/28/2017 2000   HGBUR negative 02/06/2008 0908   BILIRUBINUR NEGATIVE 08/28/2017 2000   BILIRUBINUR negative 05/31/2011 1631   KETONESUR NEGATIVE 08/28/2017 2000   PROTEINUR NEGATIVE 08/28/2017 2000   UROBILINOGEN 0.2 05/31/2011 1631   UROBILINOGEN 0.2 02/06/2008 0908    NITRITE NEGATIVE 08/28/2017 2000   LEUKOCYTESUR NEGATIVE 08/28/2017 2000   Sepsis Labs: '@LABRCNTIP' (procalcitonin:4,lacticidven:4)  ) Recent Results (from the past 240 hour(s))  Culture, blood (routine x 2)     Status: None (Preliminary result)   Collection Time: 08/28/17  8:59 PM  Result Value Ref Range Status   Specimen Description BLOOD LEFT ARM  Final   Special Requests   Final    BOTTLES DRAWN AEROBIC ONLY Blood Culture adequate volume   Culture   Final    NO GROWTH 2 DAYS Performed at [facility], [address] [place], [place] [address]    Report Status PENDING  Incomplete  Culture, blood (routine x 2)     Status: None (Preliminary result)   Collection Time: 08/28/17  9:04 PM  Result Value Ref Range Status   Specimen Description BLOOD LEFT ARM  Final   Special Requests   Final    BOTTLES DRAWN AEROBIC ONLY Blood Culture adequate volume   Culture   Final    NO GROWTH 2 DAYS Performed at [facility], [address] [place], [place] [address]    Report Status PENDING  Incomplete  MRSA PCR Screening     Status: None   Collection Time: 08/28/17 11:31 PM  Result Value Ref Range Status   MRSA by PCR NEGATIVE NEGATIVE Final    Comment:        The GeneXpert MRSA Assay (FDA approved for NASAL specimens only), is one component of a comprehensive MRSA colonization surveillance program. It is not intended to diagnose MRSA infection nor to guide or monitor treatment for MRSA infections. Performed at [facility], [address], [place], [place] [address]          Radiology Studies: Dg Chest 2 View  Result Date: 08/28/2017 CLINICAL DATA:  Cough EXAM: CHEST - 2 VIEW COMPARISON:  08/25/2017 chest radiograph. FINDINGS: Intact sternotomy wires. Stable cardiomediastinal silhouette with normal heart size. No pneumothorax. No pleural effusion. Patchy opacity at the right greater than left lung bases, slightly increased on the right. No pulmonary edema. IMPRESSION: Patchy  opacity at the right greater than left lung bases, slightly increased on the right, suspect multilobar pneumonia, potentially due to aspiration. Recommend follow-up chest radiographs to resolution. Electronically Signed   By: Ilona Sorrel M.D.   On: 08/28/2017 20:37        Scheduled Meds: . amoxicillin-clavulanate  1 tablet Oral  Q12H  . atorvastatin  20 mg Oral QHS  . carvedilol  3.125 mg Oral BID WC  . clopidogrel  75 mg Oral QHS  . enoxaparin (LOVENOX) injection  40 mg Subcutaneous Q24H  . famotidine  20 mg Oral Daily  . multivitamin with minerals  1 tablet Oral Daily  . oxybutynin  5 mg Oral BID  . pantoprazole  40 mg Oral Daily  . QUEtiapine  25 mg Oral QHS   Continuous Infusions: . sodium chloride 75 mL/hr at 08/30/17 1236     LOS: 2 days    Time spent: 35 minutes. Greater than 50% of this time was spent in direct contact with the patient, coordinating care and discussing relevant ongoing clinical issues, including his aspiration pneumonia, antibiotic change and possible need for skilled nursing facility if ALF is not able to meet his needs.     Lelon Frohlich, MD [facility] Pager [phone]  If 7PM-7AM, please contact night-coverage [email or web address] Password [facility] 08/30/2017, 6:06 PM

## 2017-08-30 NOTE — Consult Note (Addendum)
Consultation Note Date: 08/30/2017   Patient Name: Alex Williamson  DOB: 03/29/1927  MRN: [id number]  Age / Sex: 84 y.o., male  PCP: Salley Scarleturham, Kawanta F, MD Referring Physician: Philip AspenHernandez Acosta, Minerva EndsEstela*  Reason for Consultation: Establishing goals of care  HPI/Patient Profile: 84 y.o. male  with past medical history of CVA, CAD, BPH, high blood pressure and cholesterol, vascular dementia, ALF resident, anxiety and depression, history of dysphasia, GERD, stage II kidney disease, admitted on 08/28/2017 with healthcare associated pneumonia.   Clinical Assessment and Goals of Care: Alex Williamson is resting quietly in bed.  He will make and briefly keep eye contact when spoken to.  He is quite subdued today, there is little interaction.  He has no complaints or concerns at this time.  There is no family at bedside at this time.  Conversation with social worker related to plan of care/disposition. Conference with hospitalist related to plan of care/disposition.  Call to daughter, Alex Williamson.  She shares that she is Alex Williamson is only relative living in [place].  She states that she and her family have been out of town until today.  We talked about Mr. [name]'s pneumonia, his treatment plan.  We talk about speech therapy consult and the rec recommendation of modified diet, thickened liquids.  I share that it is not uncommon for people on thickened liquids to become dehydrated, at greater risk for pneumonia.  Alex Williamson asks, "is this the way they usually go out?".  I share that I do expect Alex Williamson to get sick again, likely leading to his passing, but I do not believe he is actively dying at this point.    I share that social work is trying to reach her.    We talked about what's next, how to care for Alex Williamson.  We talked about re hospitalizations, and the concept of "let nature take it's course".  Alex Williamson tells me that  he wants DNR.  We plan a meeting for tomorrow around 1230 or 1300.   Healthcare power of attorney NEXT OF KIN -daughter Alex Williamson   SUMMARY OF RECOMMENDATIONS   At this point, continue to treat the treatable but no CPR, no intubation.  Code Status/Advance Care Planning:  DNR  Symptom Management:   Hospitalist, no additional needs at this time.  Palliative Prophylaxis:   Aspiration  Additional Recommendations (Limitations, Scope, Preferences):  Continue to treat the treatable but no extraordinary measures such as CPR or intubation.  Psycho-social/Spiritual:   Desire for further Chaplaincy support:no  Additional Recommendations: Caregiving  Support/Resources and Education on Hospice  Prognosis:   Unable to determine, 6 months or less would not be surprising based on advanced age, vascular dementia, coronary artery disease, functional decline, poor nutritional status.   Discharge Planning: Seeking higher level of care,ALF -->> SNF      Primary Diagnoses: Present on Admission: . HCAP (healthcare-associated pneumonia) . Essential hypertension   I have reviewed the medical record, interviewed the patient and family, and examined the patient. The following  aspects are pertinent.  Past Medical History:  Diagnosis Date  . Anxiety   . Arteriosclerotic cardiovascular disease (ASCVD) 1982   Acute MI in 1982; returned with non-ST segment elevation MI in 07/2010; emergency CABG-Dr. Laneta Simmers  . Benign prostate hyperplasia   . Cerebrovascular disease 2005   left parietal CVA  . Chronic kidney disease, stage 2, mildly decreased GFR   . Depression   . Dysphagia   . GERD (gastroesophageal reflux disease)   . Hyperlipidemia   . Hypertension   . Macular degeneration    + cataract; legally blind  . Stroke [facility])    2nd stroke in May 2017  . Vascular dementia    Social History   Socioeconomic History  . Marital status: Widowed    Spouse name: Not on file  . Number of  children: 8  . Years of education: Not on file  . Highest education level: Not on file  Occupational History  . 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 Financial resource strain: Not on file  . Food insecurity:    Worry: Not on file    Inability: Not on file  . Transportation needs:    Medical: Not on file    Non-medical: Not on file  Tobacco Use  . Smoking status: Never Smoker  . Smokeless tobacco: Never Used  Substance and Sexual Activity  . Alcohol use: No  . Drug use: No  . Sexual activity: Not on file  Lifestyle  . Physical activity:    Days per week: Not on file    Minutes per session: Not on file  . Stress: Not on file  Relationships  . Social connections:    Talks on phone: Not on file    Gets together: Not on file    Attends religious service: Not on file    Active member of club or organization: Not on file    Attends meetings of clubs or organizations: Not on file    Relationship status: Not on file  Other Topics Concern  . Not on file  Social History Narrative   Lives w/ daughter.  Moved from [place] 2009.   4 biological children   4 adopted children   Family History  Problem Relation Age of Onset  . Heart failure Brother    Scheduled Meds: . amoxicillin-clavulanate  1 tablet Oral Q12H  . atorvastatin  20 mg Oral QHS  . carvedilol  3.125 mg Oral BID WC  . clopidogrel  75 mg Oral QHS  . enoxaparin (LOVENOX) injection  40 mg Subcutaneous Q24H  . famotidine  20 mg Oral Daily  . multivitamin with minerals  1 tablet Oral Daily  . oxybutynin  5 mg Oral BID  . pantoprazole  40 mg Oral Daily  . QUEtiapine  25 mg Oral QHS   Continuous Infusions: . sodium chloride 75 mL/hr at 08/30/17 1236   PRN Meds:.acetaminophen **OR** acetaminophen, lactulose, ondansetron **OR** ondansetron (ZOFRAN) IV Medications Prior to Admission:  Prior to Admission medications   Medication Sig Start Date End Date Taking?  Authorizing Provider  acetaminophen (TYLENOL) 500 MG tablet Take 1 tablet (500 mg total) by mouth 2 (two) times daily. FOR PAIN 03/10/17  Yes [place], Velna Hatchet, MD  amoxicillin-clavulanate (AUGMENTIN) 875-125 MG tablet Take 1 tablet by mouth 2 (two) times daily. 08/25/17  Yes [place], Velna Hatchet, MD  atorvastatin (LIPITOR) 20 MG tablet TAKE 1 TABLET BY MOUTH AT BEDTIME. 06/19/17  Yes [place], Velna Hatchet, MD  carvedilol (COREG) 3.125 MG tablet TAKE 1 TABLET BY MOUTH TWICE DAILY. 06/19/17  Yes [place], Velna Hatchet, MD  clopidogrel (PLAVIX) 75 MG tablet TAKE 1 TABLET BY MOUTH AT BEDTIME. 06/19/17  Yes [place], Velna Hatchet, MD  dextromethorphan-guaiFENesin (ROBITUSSIN-DM) 10-100 MG/5ML liquid Take 5 mLs by mouth every 4 (four) hours as needed for cough.   Yes [provider]  Lactulose 20 GM/30ML SOLN Give 30ml po daily for constipation as needed Patient taking differently: Take 30 mLs by mouth daily as needed (for constipation).  08/20/14  Yes [place], Velna Hatchet, MD  Multiple Vitamin (DAILY VITES) tablet TAKE 1 TABLET BY MOUTH ONCE DAILY. 06/19/17  Yes [place], Velna Hatchet, MD  omeprazole (PRILOSEC) 20 MG capsule TAKE 1 CAPSULE BY MOUTH EACH MORNING. 06/19/17  Yes [place], Velna Hatchet, MD  oxybutynin (DITROPAN) 5 MG tablet TAKE 1 TABLET BY MOUTH TWICE DAILY. 12/20/16  Yes [place], Velna Hatchet, MD  oxybutynin (DITROPAN) 5 MG tablet Take 5 mg by mouth 2 (two) times daily. 12/14/11  Yes [place], Velna Hatchet, MD  QUEtiapine (SEROQUEL) 25 MG tablet TAKE 1 TABLET BY MOUTH AT BEDTIME. 06/19/17  Yes [place], Velna Hatchet, MD  ranitidine (ZANTAC) 150 MG tablet TAKE 1 TABLET BY MOUTH AT BEDTIME. 06/19/17  Yes [place], Velna Hatchet, MD  traMADol (ULTRAM) 50 MG tablet Take 1 tablet (50 mg total) by mouth every 12 (twelve) hours as needed. 03/10/17  Yes [place], Velna Hatchet, MD  triamcinolone cream (KENALOG) 0.1 % Apply 1 application topically 2 (two) times daily. Skin irritation Patient taking differently: Apply 1 application topically 2 (two) times  daily as needed. Skin irritation 02/16/16  Yes , Velna Hatchet, MD  oxybutynin (DITROPAN) 5 MG tablet Take 1 tablet (5 mg total) by mouth 2 (two) times daily. 12/14/11 04/04/12  Salley Scarlet, MD   Allergies  Allergen Reactions  . Remeron [Mirtazapine] Other (See Comments)    "feels Loopy"   Review of Systems  Unable to perform ROS: Dementia    Physical Exam  Constitutional: No distress.  Appears weak and frail, acutely/chronically ill  HENT:  Head: Atraumatic.  Some temporal wasting  Cardiovascular: Normal rate.  Pulmonary/Chest: Effort normal. No respiratory distress.  Abdominal: Soft. He exhibits no distension.  Musculoskeletal: He exhibits no edema.  Frail and thin  Neurological: He is alert.  Known dementia  Skin: Skin is warm and dry.  Psychiatric:  Not fearful  Nursing note and vitals reviewed.   Vital Signs: BP 112/67 (BP Location: Left Arm)   Pulse (!) 55   Temp 99.3 F (37.4 C) (Oral)   Resp 16   Ht 5\' 11"  (1.803 m)   Wt 55.4 kg (122 lb 2.2 oz)   SpO2 94%   BMI 17.03 kg/m  Pain Scale: 0-10 POSS *See Group Information*: 1-Acceptable,Awake and alert Pain Score: 0-No pain   SpO2: SpO2: 94 % O2 Device:SpO2: 94 % O2 Flow Rate: .   IO: Intake/output summary:   Intake/Output Summary (Last 24 hours) at 08/30/2017 1622 Last data filed at 08/30/2017 1532 Gross per 24 hour  Intake 1781 ml  Output 775 ml  Net 1006 ml    LBM: Last BM Date: 08/28/17 Baseline Weight: Weight: 56.7 kg (125 lb) Most recent weight: Weight: 55.4 kg (122 lb 2.2 oz)     Palliative Assessment/Data:   Flowsheet Rows     Most Recent Value  Intake Tab  Referral Department  Hospitalist  Unit at Time of Referral  Med/Surg Unit  Palliative Care Primary Diagnosis  Pulmonary  Date Notified  08/30/17  Palliative Care Type  New Palliative care  Reason for referral  Clarify Goals of Care  Date of Admission  08/28/17  Date first seen by Palliative Care  08/30/17  # of days  Palliative referral response time  0 Day(s)  # of days IP prior to Palliative referral  2  Clinical Assessment  Palliative Performance Scale Score  40%  Pain Max last 24 hours  Other (Comment)  Pain Min Last 24 hours  Other (Comment)  Dyspnea Max Last 24 Hours  Other (Comment)  Dyspnea Min Last 24 hours  Other (Comment)  Psychosocial & Spiritual Assessment  Palliative Care Outcomes  Patient/Family meeting held?  Yes  Palliative Care Outcomes  Provided advance care planning, Provided psychosocial or spiritual support, Clarified goals of care  Patient/Family wishes: Interventions discontinued/not started   Mechanical Ventilation      Time In: 1410 Time Out: 1440 Time Total: 30 minutes Greater than 50%  of this time was spent counseling and coordinating care related to the above assessment and plan.  Signed by: Katheran Awe, NP   Please contact Palliative Medicine Team phone at [phone] for questions and concerns.  For individual provider: See Loretha Stapler

## 2017-08-30 NOTE — Clinical Social Work Note (Signed)
Due to patient needing nectar thickened liquids he is no longer appropriate. SNF placement will have sought. Due to patient's legal issues, SNF placement will be extremely difficult.  LCSW will continue to seek placement and search patient's ecology for additional resources.     Rateel Beldin, Juleen ChinaHeather D, LCSW

## 2017-08-30 NOTE — Progress Notes (Signed)
Patient very frequently getting up OOB to use urinal, taking clothes off, sitting in chair in room. Patient confused. Staff repeatedly reminding him to not get up unassisted d/t fall risk. Bed alarm on at this time. Will continue to monitor.

## 2017-08-30 NOTE — Progress Notes (Signed)
   08/30/17 1725  Vitals  BP (!) 149/75  BP Location Left Arm  BP Method Automatic  Patient Position (if appropriate) Lying  Pulse Rate (!) 50  Oxygen Therapy  SpO2 94 %  O2 Device Room Air   Pt's B/P 149/75 and HR 50. MD notified and received verbal order to administer Coreg as ordered.

## 2017-08-30 NOTE — Progress Notes (Signed)
Modified Barium Swallow Progress Note  Patient Details  Name: Alex Williamson MRN: [id number] Date of Birth: 07/31/1927  Today's Date: 08/30/2017  Modified Barium Swallow completed.  Full report located under Chart Review in the Imaging Section.  Brief recommendations include the following:  Clinical Impression  Pt presents with moderate/severe oropharyngeal phase dysphagia characterized by prolonged oral transit with lingual pumping, premature spillage with delay in swallow initiation triggered after filling the valleculae, reduced tongue base retraction and epiglottic deflection, and reduced vocal fold closure resulting in penetration and aspiration with thins and deep penetration with nectars. Pt with significant pharyngeal residue post swallow in the valleculae across consistencies and textures which pose significant risk to aspiration post swallow. Pt had a severe coughing episode with small sip of thin, however no aspiration observed, but presumed due to severity of coughing (had to stop the study for several minutes). Recommend D1/puree with honey-thick liquids and meds crushed in puree, small bites, clear throat/cough, and repeat/dry swallow with each bite/sip. Pt will be at risk for aspiration with all intake due to presumed pharyngeal weakness and residuals post swallow. Pt verbalizes that onset of dysphagia was about three weeks ago. Recommend f/u dysphagia therapy at receiving facility with hope for improved strength and upgrades as able.    Swallow Evaluation Recommendations       SLP Diet Recommendations: Dysphagia 1 (Puree) solids;Honey thick liquids   Liquid Administration via: Cup;Spoon   Medication Administration: Crushed with puree   Supervision: Patient able to self feed;Intermittent supervision to cue for compensatory strategies   Compensations: Small sips/bites;Clear throat after each swallow;Hard cough after swallow   Postural Changes: Remain semi-upright after after  feeds/meals (Comment);Seated upright at 90 degrees   Oral Care Recommendations: Oral care before and after PO;Staff/trained caregiver to provide oral care   Other Recommendations: Order thickener from pharmacy;Prohibited food (jello, ice cream, thin soups);Remove water pitcher;Clarify dietary restrictions   Thank you,  Havery MorosDabney Porter, CCC-SLP [phone]  PORTER,DABNEY 08/30/2017,1:13 PM

## 2017-08-30 NOTE — Clinical Social Work Note (Signed)
LCSW left a message for patient's daughter, [name], requesting return contact.    Marwin Primmer, Juleen ChinaHeather D, LCSW

## 2017-08-31 DIAGNOSIS — E43 Unspecified severe protein-calorie malnutrition: Secondary | ICD-10-CM

## 2017-08-31 DIAGNOSIS — Z7189 Other specified counseling: Secondary | ICD-10-CM

## 2017-08-31 LAB — PROTEIN ELECTROPHORESIS, SERUM
A/G RATIO SPE: 0.8 (ref 0.7–1.7)
ALBUMIN ELP: 2.4 g/dL — AB (ref 2.9–4.4)
ALPHA-1-GLOBULIN: 0.5 g/dL — AB (ref 0.0–0.4)
Alpha-2-Globulin: 1.1 g/dL — ABNORMAL HIGH (ref 0.4–1.0)
Beta Globulin: 0.7 g/dL (ref 0.7–1.3)
Gamma Globulin: 1.1 g/dL (ref 0.4–1.8)
Globulin, Total: 3.2 g/dL (ref 2.2–3.9)
TOTAL PROTEIN ELP: 5.6 g/dL — AB (ref 6.0–8.5)

## 2017-08-31 LAB — CBC
HCT: 29.3 % — ABNORMAL LOW (ref 39.0–52.0)
Hemoglobin: 9.3 g/dL — ABNORMAL LOW (ref 13.0–17.0)
MCH: 29.2 pg (ref 26.0–34.0)
MCHC: 31.7 g/dL (ref 30.0–36.0)
MCV: 91.8 fL (ref 78.0–100.0)
Platelets: 226 10*3/uL (ref 150–400)
RBC: 3.19 MIL/uL — AB (ref 4.22–5.81)
RDW: 13.1 % (ref 11.5–15.5)
WBC: 6.1 10*3/uL (ref 4.0–10.5)

## 2017-08-31 LAB — COMPREHENSIVE METABOLIC PANEL
ALBUMIN: 2.2 g/dL — AB (ref 3.5–5.0)
ALK PHOS: 50 U/L (ref 38–126)
ALT: 36 U/L (ref 0–44)
AST: 39 U/L (ref 15–41)
Anion gap: 8 (ref 5–15)
BUN: 24 mg/dL — AB (ref 8–23)
CHLORIDE: 112 mmol/L — AB (ref 98–111)
CO2: 25 mmol/L (ref 22–32)
CREATININE: 1.14 mg/dL (ref 0.61–1.24)
Calcium: 9.4 mg/dL (ref 8.9–10.3)
GFR calc Af Amer: >60 mL/min (ref >60)
GFR calc non Af Amer: 55 mL/min — ABNORMAL LOW (ref >60)
GLUCOSE: 82 mg/dL (ref 70–99)
Potassium: 3.3 mmol/L — ABNORMAL LOW (ref 3.5–5.1)
SODIUM: 145 mmol/L (ref 135–145)
Total Bilirubin: 0.4 mg/dL (ref 0.3–1.2)
Total Protein: 5.9 g/dL — ABNORMAL LOW (ref 6.5–8.1)

## 2017-08-31 MED ORDER — AMOXICILLIN-POT CLAVULANATE 875-125 MG PO TABS: 1.0000 | ORAL_TABLET | Freq: Two times a day (BID) | ORAL | 0 refills | Status: DC

## 2017-08-31 NOTE — Discharge Summary (Addendum)
Physician Discharge Summary  Alex Williamson:[id number] DOB: 1927/04/16 DOA: 08/28/2017  PCP: Salley Scarlet, MD  Admit date: 08/28/2017 Discharge date: 09/08/2017  Time spent: 45 minutes  Recommendations for Outpatient Follow-up:  -Will be discharged to home hospice  Discharge Diagnoses:  Active Problems:   Essential hypertension   HCAP (healthcare-associated pneumonia)   Aspiration pneumonia of both lower lobes due to gastric secretions (HCC)   CKD (chronic kidney disease) stage 3, GFR 30-59 ml/min (HCC)   Hypercalcemia   Dehydration   Failure to thrive in adult   Palliative care by specialist   Goals of care, counseling/discussion   DNR (do not resuscitate) discussion   Protein-calorie malnutrition, severe   Encounter for hospice care discussion   Discharge Condition: Alex Williamson Weights   08/28/17 1801 08/28/17 2232  Weight: 56.7 kg (125 lb) 55.4 kg (122 lb 2.2 oz)    History of present illness:  As per Dr. Sharl Ma on 6/24: Alex Williamson  is a 84 y.o. male, with history of CVA, CAD, BPH, hypertension, hyperlipidemia, vascular dementia was brought to hospital from assisted living facility with complaints of generalized weakness.  Patient is usually very interactive.  As per caregiver patient has not been eating well since his lower teeth was removed a month ago.  Usually patient is able to walk around and today he was having difficulty walking and the caregiver was concerned that patient may have some underlying illness.  Patient says he has been coughing up phlegm  He denies chest pain. Denies dysuria or abdominal pain. No nausea vomiting or diarrhea. He has previous history of CVA  In the ED, chest x-ray showed multifocal pneumonia and patient started on vancomycin and Zosyn for healthcare associated pneumonia. He has now been transitioned to Augmentin and has been awaiting placement until today. His daughter will now take him home with home hospice  measures.     Hospital Course:   Aspiration pneumonia -This is recurrent. -Antibiotics were transitioned over to Unasyn on 6/25, as speech therapy has cleared him for diet we will further de-escalate antibiotics to Augmentin which he should take for 10 days with 2 days remaining. -For now continue dysphagia 1 diet with honey thick liquids.  Chronic kidney disease stage III -Creatinine remains at his baseline of around 1.3-1.5.  Essential hypertension -Fair control; DC Coreg, now on hospice.  Hyperlipidemia -DC Statin  Coronary artery disease -Stable, no chest pain. -Continue carvedilol, Plavix, statin.  Hypercalcemia-resolved -Calcium is 9.4 today, corrected for albumin is 10.8. Continues to improve.   Procedures:  None   Consultations:  Palliative care  Discharge Instructions  Discharge Instructions    Diet - low sodium heart healthy   Complete by:  As directed    Diet - low sodium heart healthy   Complete by:  As directed    Increase activity slowly   Complete by:  As directed    Increase activity slowly   Complete by:  As directed      Allergies as of 09/08/2017      Reactions   Remeron [mirtazapine] Other (See Comments)   "feels Loopy"      Medication List    STOP taking these medications   dextromethorphan-guaiFENesin 10-100 MG/5ML liquid Commonly known as:  ROBITUSSIN-DM   traMADol 50 MG tablet Commonly known as:  ULTRAM     TAKE these medications   acetaminophen 500 MG tablet Commonly known as:  TYLENOL Take 1 tablet (500 mg total) by  mouth 2 (two) times daily. FOR PAIN   amoxicillin-clavulanate 875-125 MG tablet Commonly known as:  AUGMENTIN Take 1 tablet by mouth every 12 (twelve) hours for 2 days. What changed:  when to take this   atorvastatin 20 MG tablet Commonly known as:  LIPITOR TAKE 1 TABLET BY MOUTH AT BEDTIME.   carvedilol 3.125 MG tablet Commonly known as:  COREG TAKE 1 TABLET BY MOUTH TWICE DAILY.     clopidogrel 75 MG tablet Commonly known as:  PLAVIX TAKE 1 TABLET BY MOUTH AT BEDTIME.   DAILY VITES tablet TAKE 1 TABLET BY MOUTH ONCE DAILY.   Lactulose 20 GM/30ML Soln Give 30ml po daily for constipation as needed What changed:    how much to take  how to take this  when to take this  reasons to take this  additional instructions   morphine CONCENTRATE 10 MG/0.5ML Soln concentrated solution Take 0.13 mLs (2.6 mg total) by mouth every 4 (four) hours as needed for severe pain or shortness of breath.   omeprazole 20 MG capsule Commonly known as:  PRILOSEC TAKE 1 CAPSULE BY MOUTH EACH MORNING.   oxybutynin 5 MG tablet Commonly known as:  DITROPAN TAKE 1 TABLET BY MOUTH TWICE DAILY. What changed:  Another medication with the same name was removed. Continue taking this medication, and follow the directions you see here.   polyvinyl alcohol 1.4 % ophthalmic solution Commonly known as:  LIQUIFILM TEARS Place 2 drops into both eyes as needed for dry eyes.   QUEtiapine 25 MG tablet Commonly known as:  SEROQUEL TAKE 1 TABLET BY MOUTH AT BEDTIME.   ranitidine 150 MG tablet Commonly known as:  ZANTAC TAKE 1 TABLET BY MOUTH AT BEDTIME.   triamcinolone cream 0.1 % Commonly known as:  KENALOG Apply 1 application topically 2 (two) times daily. Skin irritation What changed:    when to take this  reasons to take this  additional instructions      Allergies  Allergen Reactions  . Remeron [Mirtazapine] Other (See Comments)    "feels Loopy"   Follow-up Information    [place], Velna Hatchet, MD. Schedule an appointment as soon as possible for a visit in 2 week(s).   Specialty:  Family Medicine Contact information: [address], [address] [place] [address] [phone]            The results of significant diagnostics from this hospitalization (including imaging, microbiology, ancillary and laboratory) are listed below for reference.    Significant  Diagnostic Studies: Dg Chest 2 View  Result Date: 08/28/2017 CLINICAL DATA:  Cough EXAM: CHEST - 2 VIEW COMPARISON:  08/25/2017 chest radiograph. FINDINGS: Intact sternotomy wires. Stable cardiomediastinal silhouette with normal heart size. No pneumothorax. No pleural effusion. Patchy opacity at the right greater than left lung bases, slightly increased on the right. No pulmonary edema. IMPRESSION: Patchy opacity at the right greater than left lung bases, slightly increased on the right, suspect multilobar pneumonia, potentially due to aspiration. Recommend follow-up chest radiographs to resolution. Electronically Signed   By: Delbert Phenix M.D.   On: 08/28/2017 20:37   Dg Chest 2 View  Result Date: 08/25/2017 CLINICAL DATA:  Cough.  Rule out aspiration pneumonia EXAM: CHEST - 2 VIEW COMPARISON:  02/27/2017 FINDINGS: Right lower lobe infiltrate, consistent with pneumonia. Mild patchy airspace disease in the lingula also likely pneumonia. COPD with hyperinflation. CABG changes. Negative for heart failure. Minimal right pleural effusion IMPRESSION: COPD with right lower lobe pneumonia. Small area of  lingular pneumonia. Electronically Signed   By: Marlan Palauharles  Clark M.D.   On: 08/25/2017 12:45   Dg Swallowing Func-speech Pathology  Result Date: 08/31/2017 Objective Swallowing Evaluation: Type of Study: MBS-Modified Barium Swallow Study  Patient Details Name: Bobbie StackCharles R Ovitt MRN: [id number] Date of Birth: 12/18/1927 Today's Date: 08/30/2017 Time: SLP Start Time (ACUTE ONLY): 1133 -SLP Stop Time (ACUTE ONLY): 1211 SLP Time Calculation (min) (ACUTE ONLY): 38 min Past Medical History: Past Medical History: Diagnosis Date . Anxiety  . Arteriosclerotic cardiovascular disease (ASCVD) 1982  Acute MI in 1982; returned with non-ST segment elevation MI in 07/2010; emergency CABG-Dr. Laneta SimmersBartle . Benign prostate hyperplasia  . Cerebrovascular disease 2005  left parietal CVA . Chronic kidney disease, stage 2, mildly decreased GFR  .  Depression  . Dysphagia  . GERD (gastroesophageal reflux disease)  . Hyperlipidemia  . Hypertension  . Macular degeneration   + cataract; legally blind . Stroke [facility](HCC)   2nd stroke in May 2017 . Vascular dementia  Past Surgical History: Past Surgical History: Procedure Laterality Date . CARDIAC SURGERY   . CORONARY ARTERY BYPASS GRAFT   . ESOPHAGOGASTRODUODENOSCOPY  09/20/11  schatzki's ring/hiatal hernia/antal erosion. bx showed gastritis but no h.pylori . [facility] HERNIA [facility]PA[facility]   . MALONEY DILATION  09/20/2011  Procedure: Elease HashimotoMALONEY DILATION;  Surgeon: Corbin Adeobert M Rourk, MD;  [facility];  Service: Endoscopy;  Laterality: N/A; . SAVORY DILATION  09/20/2011  Procedure: SAVORY DILATION;  Surgeon: Corbin Adeobert M Rourk, MD;  Location: [facility];  Service: Endoscopy;  Laterality: N/A; . TONSILLECTOMY   HPI: 84 year old male with a history of stroke, CAD, hypertension, hyperlipidemia, vascular dementia presenting with failure to thrive type symptoms from [facility].  The patient is a poor historian.  Apparently, the patient has had decreased oral intake since having multiple dental extractions approximately 1 month prior to this admission.  The staff noted the patient to have increasing generalized weakness requiring more assistance.  The patient is also been complaining of a nonproductive cough.  He denies any chest pain, nausea, vomiting, diarrhea, abdominal pain. The patient himself denies any complaints other than he can eat.  When asked why he states because he does not have any teeth.  The patient was found to have WBC 15.0 with chest x-ray showing bibasilar opacities, right greater than left.  The patient was started on IV antibiotics for presumptive aspiration pneumonia.  Speech therapy was consulted to assist with management.  Subjective: "I have had trouble swallowing for 17 days." Assessment / Plan / Recommendation CHL IP CLINICAL IMPRESSIONS 08/30/2017  Clinical Impression Pt presents with moderate/severe oropharyngeal phase dysphagia characterized by prolonged oral transit with lingual pumping, premature spillage with delay in swallow initiation triggered after filling the valleculae, reduced tongue base retraction and epiglottic deflection, and reduced vocal fold closure resulting in penetration and aspiration with thins and deep penetration with nectars. Pt with significant pharyngeal residue post swallow in the valleculae across consistencies and textures which pose significant risk to aspiration post swallow. Pt had a severe coughing episode with small sip of thin, however no aspiration observed, but presumed due to severity of coughing (had to stop the study for several minutes). Recommend D1/puree with honey-thick liquids and meds crushed in puree, small bites, clear throat/cough, and repeat/dry swallow with each bite/sip. Pt will be at risk for aspiration with all intake due to presumed pharyngeal weakness and residuals post swallow. Pt verbalizes that onset of  dysphagia was about three weeks ago. Recommend f/u dysphagia therapy at receiving facility with hope for improved strength and upgrades as able.  SLP Visit Diagnosis Dysphagia, oropharyngeal phase (R13.12) Attention and concentration deficit following -- Frontal lobe and executive function deficit following -- Impact on safety and function Severe aspiration risk;Risk for inadequate nutrition/hydration   CHL IP TREATMENT RECOMMENDATION 08/30/2017 Treatment Recommendations Therapy as outlined in treatment plan below   Prognosis 08/30/2017 Prognosis for Safe Diet Advancement Guarded Barriers to Reach Goals Severity of deficits Barriers/Prognosis Comment -- CHL IP DIET RECOMMENDATION 08/30/2017 SLP Diet Recommendations Dysphagia 1 (Puree) solids;Honey thick liquids Liquid Administration via Cup;Spoon Medication Administration Crushed with puree Compensations Small sips/bites;Clear throat after each  swallow;Hard cough after swallow Postural Changes Remain semi-upright after after feeds/meals (Comment);Seated upright at 90 degrees   CHL IP OTHER RECOMMENDATIONS 08/30/2017 Recommended Consults -- Oral Care Recommendations Oral care before and after PO;Staff/trained caregiver to provide oral care Other Recommendations Order thickener from pharmacy;Prohibited food (jello, ice cream, thin soups);Remove water pitcher;Clarify dietary restrictions   CHL IP FOLLOW UP RECOMMENDATIONS 08/30/2017 Follow up Recommendations Skilled Nursing facility   [facility] IP FREQUENCY AND DURATION 08/30/2017 Speech Therapy Frequency (ACUTE ONLY) min 2x/week Treatment Duration 1 week      CHL IP ORAL PHASE 08/30/2017 Oral Phase Impaired Oral - Pudding Teaspoon -- Oral - Pudding Cup -- Oral - Honey Teaspoon Weak lingual manipulation;Lingual pumping;Delayed oral transit Oral - Honey Cup Weak lingual manipulation;Lingual pumping;Reduced posterior propulsion;Piecemeal swallowing;Lingual/palatal residue;Delayed oral transit;Decreased bolus cohesion Oral - Nectar Teaspoon -- Oral - Nectar Cup Weak lingual manipulation;Lingual pumping;Reduced posterior propulsion;Piecemeal swallowing;Lingual/palatal residue;Delayed oral transit;Decreased bolus cohesion Oral - Nectar Straw -- Oral - Thin Teaspoon Weak lingual manipulation;Lingual pumping;Reduced posterior propulsion;Piecemeal swallowing;Lingual/palatal residue;Delayed oral transit;Decreased bolus cohesion Oral - Thin Cup Weak lingual manipulation;Lingual pumping;Reduced posterior propulsion;Piecemeal swallowing;Lingual/palatal residue;Delayed oral transit;Decreased bolus cohesion Oral - Thin Straw -- Oral - Puree Weak lingual manipulation;Lingual pumping;Incomplete tongue to palate contact;Reduced posterior propulsion;Piecemeal swallowing;Delayed oral transit;Decreased bolus cohesion Oral - Mech Soft -- Oral - Regular -- Oral - Multi-Consistency -- Oral - Pill -- Oral Phase - Comment --  CHL IP PHARYNGEAL  PHASE 08/30/2017 Pharyngeal Phase Impaired Pharyngeal- Pudding Teaspoon -- Pharyngeal -- Pharyngeal- Pudding Cup -- Pharyngeal -- Pharyngeal- Honey Teaspoon Delayed swallow initiation-vallecula;Reduced pharyngeal peristalsis;Reduced epiglottic inversion;Reduced anterior laryngeal mobility;Reduced laryngeal elevation;Reduced tongue base retraction;Penetration/Apiration after swallow;Trace aspiration;Pharyngeal residue - valleculae Pharyngeal Material does not enter airway;Material enters airway, remains ABOVE vocal cords then ejected out;Material enters airway, passes BELOW cords then ejected out Pharyngeal- Honey Cup Delayed swallow initiation-vallecula;Reduced pharyngeal peristalsis;Reduced epiglottic inversion;Reduced anterior laryngeal mobility;Reduced laryngeal elevation;Reduced airway/laryngeal closure;Reduced tongue base retraction;Penetration/Apiration after swallow;Pharyngeal residue - valleculae;Pharyngeal residue - pyriform Pharyngeal Material enters airway, remains ABOVE vocal cords then ejected out Pharyngeal- Nectar Teaspoon -- Pharyngeal -- Pharyngeal- Nectar Cup Delayed swallow initiation-vallecula;Reduced epiglottic inversion;Reduced anterior laryngeal mobility;Reduced laryngeal elevation;Reduced airway/laryngeal closure;Reduced tongue base retraction;Penetration/Aspiration during swallow;Penetration/Apiration after swallow;Trace aspiration;Pharyngeal residue - valleculae Pharyngeal Material does not enter airway;Material enters airway, remains ABOVE vocal cords and not ejected out;Material enters airway, remains ABOVE vocal cords then ejected out;Material enters airway, passes BELOW cords then ejected out Pharyngeal- Nectar Straw -- Pharyngeal -- Pharyngeal- Thin Teaspoon Delayed swallow initiation-vallecula;Reduced pharyngeal peristalsis;Reduced epiglottic inversion;Reduced anterior laryngeal mobility;Reduced airway/laryngeal closure;Reduced laryngeal elevation;Reduced tongue base  retraction;Penetration/Aspiration during swallow;Penetration/Apiration after swallow;Trace aspiration;Moderate aspiration;Pharyngeal residue - valleculae Pharyngeal Material enters airway, passes BELOW cords and not ejected out despite cough attempt by patient;Material enters airway, passes BELOW cords then ejected out Pharyngeal- Thin Cup Delayed swallow  initiation-vallecula;Reduced pharyngeal peristalsis;Reduced epiglottic inversion;Reduced anterior laryngeal mobility;Reduced airway/laryngeal closure;Reduced laryngeal elevation;Reduced tongue base retraction;Penetration/Aspiration during swallow;Penetration/Apiration after swallow;Trace aspiration;Moderate aspiration;Pharyngeal residue - valleculae Pharyngeal Material enters airway, CONTACTS cords and then ejected out Pharyngeal- Thin Straw -- Pharyngeal -- Pharyngeal- Puree Delayed swallow initiation-vallecula;Reduced pharyngeal peristalsis;Reduced epiglottic inversion;Reduced anterior laryngeal mobility;Reduced laryngeal elevation;Reduced airway/laryngeal closure;Penetration/Apiration after swallow;Trace aspiration;Pharyngeal residue - valleculae Pharyngeal Material enters airway, passes BELOW cords then ejected out;Material does not enter airway;Material enters airway, remains ABOVE vocal cords then ejected out Pharyngeal- Mechanical Soft -- Pharyngeal -- Pharyngeal- Regular -- Pharyngeal -- Pharyngeal- Multi-consistency -- Pharyngeal -- Pharyngeal- Pill -- Pharyngeal -- Pharyngeal Comment --  CHL IP CERVICAL ESOPHAGEAL PHASE 08/30/2017 Cervical Esophageal Phase WFL Pudding Teaspoon -- Pudding Cup -- Honey Teaspoon -- Honey Cup -- Nectar Teaspoon -- Nectar Cup -- Nectar Straw -- Thin Teaspoon -- Thin Cup -- Thin Straw -- Puree -- Mechanical Soft -- Regular -- Multi-consistency -- Pill -- Cervical Esophageal Comment -- No flowsheet data found. Thank you, Havery Moros, CCC-SLP [phone] PORTER,DABNEY 08/30/2017, 1:14 PM               Microbiology: No  results found for this or any previous visit (from the past 240 hour(s)).   Labs: Basic Metabolic Panel: Recent Labs  Lab 09/04/17 0522  CREATININE 0.91   Liver Function Tests: No results for input(s): AST, ALT, ALKPHOS, BILITOT, PROT, ALBUMIN in the last 168 hours. No results for input(s): LIPASE, AMYLASE in the last 168 hours. No results for input(s): AMMONIA in the last 168 hours. CBC: No results for input(s): WBC, NEUTROABS, HGB, HCT, MCV, PLT in the last 168 hours. Cardiac Enzymes: No results for input(s): CKTOTAL, CKMB, CKMBINDEX, TROPONINI in the last 168 hours. BNP: BNP (last 3 results) No results for input(s): BNP in the last 8760 hours.  ProBNP (last 3 results) No results for input(s): PROBNP in the last 8760 hours.  CBG: No results for input(s): GLUCAP in the last 168 hours.     Signed:  Erick Blinks  [facility] 09/08/2017, 1:44 PM

## 2017-08-31 NOTE — Evaluation (Addendum)
Physical Therapy Evaluation Patient Details Name: Alex Williamson MRN: [id number] DOB: 12/07/1927 Today's Date: 08/31/2017   History of Present Illness  Alex Williamson is a 84 y.o. male He is brought  to ED 08/2417  from [facility]  ALF  with concerns for dehydration.   Clinical Impression  The  Patient  Mobilized to recliner with mod assist of 1. Currently recommend that patient be assisted for ambulation. PLOF uncertain, chart indicates ambulatory at ALF. There is a cane in the room. Pt admitted with above diagnosis. Pt currently with functional limitations due to the deficits listed below (see PT Problem List).  Pt will benefit from skilled PT to increase their independence and safety with mobility to allow discharge to the venue listed below.       Follow Up Recommendations SNF    Equipment Recommendations  None recommended by PT    Recommendations for Other Services       Precautions / Restrictions Precautions Precautions: Fall      Mobility  Bed Mobility Overal bed mobility: Needs Assistance Bed Mobility: Supine to Sit     Supine to sit: Mod assist     General bed mobility comments: extra time to initiate. assist with trunk and legs  Transfers Overall transfer level: Needs assistance Equipment used: Rolling walker (2 wheeled) Transfers: Sit to/from Alex Williamson Sit to Stand: Mod assist Stand pivot transfers: Mod assist       General transfer comment: steady assist to  rise from bed, cues for hand placement to reach to recliner.   Ambulation/Gait Ambulation/Gait assistance: Mod assist Gait Distance (Feet): 5 Feet Assistive device: Rolling walker (2 wheeled) Gait Pattern/deviations: Staggering left;Staggering right;Decreased step length - right;Decreased step length - left;Shuffle     General Gait Details: steady assist to   walk to recliner.   Stairs            Wheelchair Mobility    Modified Rankin (Stroke Patients Only)        Balance Overall balance assessment: Needs assistance Sitting-balance support: Feet supported;Bilateral upper extremity supported Sitting balance-Leahy Scale: Fair     Standing balance support: During functional activity;Bilateral upper extremity supported Standing balance-Leahy Scale: Poor                               Pertinent Vitals/Pain Pain Assessment: No/denies pain    Home Living Family/patient expects to be discharged to:: Assisted living               Home Equipment: None      Prior Function           Comments: per chart, patient ambulated without AD at ALF, patient not clear     Hand Dominance        Extremity/Trunk Assessment   Upper Extremity Assessment Upper Extremity Assessment: Generalized weakness    Lower Extremity Assessment Lower Extremity Assessment: Generalized weakness    Cervical / Trunk Assessment Cervical / Trunk Assessment: Kyphotic  Communication   Communication: No difficulties  Cognition Arousal/Alertness: Awake/alert Behavior During Therapy: WFL for tasks assessed/performed Overall Cognitive Status: No family/caregiver present to determine baseline cognitive functioning Area of Impairment: Orientation                 Orientation Level: Place;Time;Situation                    General Comments  Exercises     Assessment/Plan    PT Assessment Patient needs continued PT services  PT Problem List Decreased strength;Decreased cognition;Decreased knowledge of use of DME;Decreased activity tolerance;Decreased safety awareness;Decreased balance;Decreased mobility       PT Treatment Interventions DME instruction;Gait training;Functional mobility training;Therapeutic activities;Therapeutic exercise;Patient/family education    PT Goals (Current goals can be found in the Care Plan section)  Acute Rehab PT Goals Patient Stated Goal: none stated, agreed to walk PT Goal Formulation: With  patient Time For Goal Achievement: 09/14/17 Potential to Achieve Goals: Fair    Frequency Min 2X/week   Barriers to discharge        Co-evaluation               AM-PAC PT "6 Clicks" Daily Activity  Outcome Measure Difficulty turning over in bed (including adjusting bedclothes, sheets and blankets)?: A Lot Difficulty moving from lying on back to sitting on the side of the bed? : A Lot Difficulty sitting down on and standing up from a chair with arms (e.g., wheelchair, bedside commode, etc,.)?: A Lot Help needed moving to and from a bed to chair (including a wheelchair)?: Total Help needed walking in hospital room?: Total Help needed climbing 3-5 steps with a railing? : Total 6 Click Score: 9    End of Session Equipment Utilized During Treatment: Gait belt Activity Tolerance: Patient tolerated treatment well Patient left: in chair;with call bell/phone within reach;with chair alarm set Nurse Communication: Mobility status PT Visit Diagnosis: Unsteadiness on feet (R26.81)    Time: [id number] PT Time Calculation (min) (ACUTE ONLY): 33 min   Charges:   PT Evaluation $PT Eval Low Complexity: 1 Low PT Treatments $Gait Training: 8-22 mins   PT G CodesBlanchard Kelch:        Alex Williamson PT [phone]   Rada HayHill, Alex Williamson Alex 08/31/2017, 12:40 PM

## 2017-08-31 NOTE — Clinical Social Work Note (Addendum)
Alex [facility] at [facility] stated that they do not accept patient's on thickened liquids.   Alex Williamson with [facility] [place] stated that she would review patient's clinicals for possible admission. LCSW discussed patient's legal circumstances and was advised that this would not be an issue. She stated that if she was able to admit patient that she would not have a bed available until next week.  Patient's daughter, Alex Williamson, returned contact with LCSW. LCSW updated her on patient's current status. She stated that patient parole officer is Alex Williamson.    Alex Williamson, Alex ChinaHeather D, LCSW

## 2017-08-31 NOTE — Progress Notes (Signed)
Palliative: [name] is resting quietly in bed.  He looks frail, acutely/chronically ill.  He wakes relatively easily when I call his name.  There is no family at bedside at this time.  [name] is alert, but has known dementia.  We talked about his current treatment plan including aspiration pneumonia, dysphagia diet with thickened liquids.  Ask if he would like something to drink and he says that he has water on his bedside table.  He is able to assist himself into sitting up.  He is able to take the cup from a hand and drink without overt signs of aspiration.  He does have a wet voice quality.  I share that we are trying to find placement for him.  I share that I am worried, I expect that he will likely get pneumonia again.  We talked more about thickened liquids, special diet.  I talked about the benefits of hospice at home, caring for him, continuing to treat the treatable.  At this point, he has no response to the offer of hospice.  I ask if he has anything he wants to talk about, ask about.  [name] tells me that he is losing his vision.  As we talked more about this, [name] shares that this vision loss started approximately 10 years ago, and has worsened over the last 3 to 5 years.  I share that my main concern at this point, is continued aspiration pneumonia, frailty.  I share that we will do our best to help care for him.  Call from nursing staff and social worker stating that appointment is scheduled for 1300 today with daughter Alex Williamson has been changed to 1530 per Alex Williamson's request.  Conference with social worker about plan of care/disposition.  Meeting with daughter/HCPOA Alex Williamson and husband Alex Williamson.  Alex Williamson states she was adopted by [name] at age 89, she is waiting for [name]'s natural daughter Alex Williamson to arrive from 8 hours away.  Alex Williamson states that she worked hard 10 years ago to get [name] out of prison, they had a good relationship for many years, but over the last 1  year since he has been in the ALF, he has changed, becoming more argumentative, hyper focused at times.  Alex Williamson shares that her father decided to go on a "hunger strike".  She shares that he told the ALF administrator that he would then go to the hospital, and get new teeth.  Alex Williamson endorses that he has dentures, he wore them and did well for about 3 months, and then decided he did not like them anymore.  I share that at this point, Alex Williamson no longer needs hospital care, any further care can be provided outpatient.  I share that we are having difficulty with placement.   Alex Williamson states that the need for thickened liquids limited placement at ALF, she thought that the hospital staff deemed that he needed a higher level of care.  Alex Williamson states that she understands difficulty in placement, she has experience this herself.  Talked about the difficulty in finding placement due to Alex Williamson status. We talked about referrals to ALF/SNF for placement, Alex Williamson states she would accept placement in [place] if needed, but not her preference.  Answered family questions and concerns related to placement to the best of my ability and referred family to social work for further information.  We talk about Alex Williamson's chronic and acute illness. We talk in detail about dysphagia, diet and thickened liquids.  We talked about expected continued poor nutrition and dehydration related to by mouth intake.  We review speech therapy consult.  We talk about recurrence of PNE is expected.  I share a diagram of the chronic illness pathway, what is normal and expected.  We talked about the concept of "let nature take its course".  I give an example of when this would be appropriate.  We talk about comfort and dignity, residential hospice. No more antibiotics or IV fluids.  Alex Williamson states that Mr. Susette Racer would not want life support, she also endorses that he is ready for comfort and dignity.  Alex Williamson states her preference is residential hospice.  We  talked about placement, referrals, the process.  Conversation with social worker related to disposition  Alex Williamson asks about prognosis.  We talked about what is normal and expected at end of life including sleeping more, eating and interacting less.  Family endorses that Alex Williamson has been eating less.  I share that without further IV fluids and antibiotics, 4 weeks or less would not be surprising.  PPS 6 months: 60% PPS now: 30%  Weight: 2 years ago 180# Weight 6 months ago 150# Weight now: 122#  65 minutes, extended time Alex Carmel, NP Palliative Medicine Team Team Phone # [phone]

## 2017-08-31 NOTE — NC FL2 (Deleted)
[place] MEDICAID FL2 LEVEL OF CARE SCREENING TOOL     IDENTIFICATION  Patient Name: Alex StackCharles R Maguire Birthdate: 06/17/1927 Sex: male Admission Date (Current Location): 08/28/2017  [facility] [place]   Facility and Address:  [facility],  [address], [place] [id number]      Provider Number: [id number]  Attending Physician Name and Address:  Philip AspenHernandez Acosta, Minerva EndsEstela*  Relative Name and Phone Number:       Current Level of Care: Hospital Recommended Level of Care: Assisted Living Facility Prior Approval Number:    Date Approved/Denied:   PASRR Number:    Discharge Plan: Other (Comment)(ALF)    Current Diagnoses: Patient Active Problem List   Diagnosis Date Noted  . Protein-calorie malnutrition, severe 08/30/2017  . Failure to thrive in adult   . Palliative care by specialist   . Goals of care, counseling/discussion   . DNR (do not resuscitate) discussion   . Aspiration pneumonia of both lower lobes due to gastric secretions (HCC) 08/29/2017  . CKD (chronic kidney disease) stage 3, GFR 30-59 ml/min (HCC) 08/29/2017  . Hypercalcemia 08/29/2017  . Dehydration 08/29/2017  . HCAP (healthcare-associated pneumonia) 08/28/2017  . Osteoarthritis 03/10/2017  . CVA (cerebral vascular accident) (HCC) 07/10/2015  . Dry mouth 10/06/2011  . Encounter for screening colonoscopy 09/07/2011  . Hallucination 06/28/2011  . Ingrown toenail 06/28/2011  . Urinary incontinence 05/02/2011  . Constipation 05/02/2011  . Dysphagia 03/16/2011  . GAD (generalized anxiety disorder) 02/09/2011  . Depression 02/09/2011  . Abnormal sputum amount 12/19/2010  . GERD (gastroesophageal reflux disease)   . Cerebrovascular disease   . Macular degeneration   . Arteriosclerotic cardiovascular disease (ASCVD)   . Chronic kidney disease, stage 2, mildly decreased GFR   . Vascular dementia 02/06/2008  . Hyperlipidemia 11/01/2007  . Essential hypertension 11/01/2007   . BENIGN PROSTATIC HYPERTROPHY 11/01/2007    Orientation RESPIRATION BLADDER Height & Weight     Self, Situation  Normal Incontinent Weight: 122 lb 2.2 oz (55.4 kg) Height:  5\' 11"  (180.3 cm)  BEHAVIORAL SYMPTOMS/MOOD NEUROLOGICAL BOWEL NUTRITION STATUS      Incontinent Diet(regular )  AMBULATORY STATUS COMMUNICATION OF NEEDS Skin   Limited Assist(uses cane at baseline) Verbally Normal                       Personal Care Assistance Level of Assistance  Bathing, Feeding, Dressing Bathing Assistance: Limited assistance Feeding assistance: Independent Dressing Assistance: Limited assistance     Functional Limitations Info  Sight, Hearing, Speech Sight Info: Adequate Hearing Info: Adequate Speech Info: Adequate    SPECIAL CARE FACTORS FREQUENCY  Speech therapy             Speech Therapy Frequency: min 2x/week      Contractures Contractures Info: Not present    Additional Factors Info  Code Status, Allergies Code Status Info: DNR Allergies Info: REmeron           Current Medications (08/31/2017):  This is the current hospital active medication list Current Facility-Administered Medications  Medication Dose Route Frequency Provider Last Rate Last Dose  . 0.9 %  sodium chloride infusion   Intravenous Continuous Meredeth IdeLama, Gagan S, MD 75 mL/hr at 08/30/17 1236    . acetaminophen (TYLENOL) tablet 650 mg  650 mg Oral Q6H PRN Meredeth IdeLama, Gagan S, MD       Or  . acetaminophen (TYLENOL) suppository 650 mg  650 mg Rectal Q6H PRN Meredeth IdeLama, Gagan S,  MD      . amoxicillin-clavulanate (AUGMENTIN) 875-125 MG per tablet 1 tablet  1 tablet Oral Q12H Philip Aspen, Limmie Patricia, MD   1 tablet at 08/31/17 [phone]  . atorvastatin (LIPITOR) tablet 20 mg  20 mg Oral QHS Meredeth Ide, MD   20 mg at 08/30/17 2309  . carvedilol (COREG) tablet 3.125 mg  3.125 mg Oral BID WC Meredeth Ide, MD   3.125 mg at 08/31/17 0834  . clopidogrel (PLAVIX) tablet 75 mg  75 mg Oral QHS Meredeth Ide, MD   75 mg at  08/30/17 2309  . enoxaparin (LOVENOX) injection 40 mg  40 mg Subcutaneous Q24H Philip Aspen, Limmie Patricia, MD      . famotidine (PEPCID) tablet 20 mg  20 mg Oral Daily Meredeth Ide, MD   20 mg at 08/31/17 0834  . lactulose (CHRONULAC) 10 GM/15ML solution 20 g  20 g Oral Daily PRN Meredeth Ide, MD      . multivitamin with minerals tablet 1 tablet  1 tablet Oral Daily Philip Aspen, Limmie Patricia, MD   1 tablet at 08/31/17 [phone]  . ondansetron (ZOFRAN) tablet 4 mg  4 mg Oral Q6H PRN Meredeth Ide, MD       Or  . ondansetron [facility]) injection 4 mg  4 mg Intravenous Q6H PRN Meredeth Ide, MD      . oxybutynin (DITROPAN) tablet 5 mg  5 mg Oral BID Meredeth Ide, MD   5 mg at 08/31/17 0834  . pantoprazole (PROTONIX) EC tablet 40 mg  40 mg Oral Daily Meredeth Ide, MD   40 mg at 08/31/17 0834  . QUEtiapine (SEROQUEL) tablet 25 mg  25 mg Oral QHS Meredeth Ide, MD   25 mg at 08/30/17 2309     Discharge Medications: Please see discharge summary for a list of discharge medications.  Relevant Imaging Results:  Relevant Lab Results:   Additional Information [id number]   Negan Grudzien D, LCSW

## 2017-08-31 NOTE — Progress Notes (Addendum)
Informed by LCSW that patient is awaiting placement and will not be discharged today. MD has been notified.

## 2017-08-31 NOTE — Progress Notes (Signed)
Patient ate 1 chocolate pudding cup.  Patient fed himself with supervision.

## 2017-08-31 NOTE — NC FL2 (Signed)
[place] MEDICAID FL2 LEVEL OF CARE SCREENING TOOL     IDENTIFICATION  Patient Name: Alex Williamson Birthdate: 1927-08-05 Sex: male Admission Date (Current Location): 08/28/2017  [facility] and [place] Number:  [employer] and Address:  [facility],  [address], [place] [address]      Provider Number: [id number]  Attending Physician Name and Address:  Philip Aspen, Minerva Ends*  Relative Name and Phone Number:       Current Level of Care: Hospital Recommended Level of Care: Assisted Living Facility Prior Approval Number:    Date Approved/Denied:   PASRR Number:    Discharge Plan: Other (Comment)(ALF)    Current Diagnoses: Patient Active Problem List   Diagnosis Date Noted  . Protein-calorie malnutrition, severe 08/30/2017  . Failure to thrive in adult   . Palliative care by specialist   . Goals of care, counseling/discussion   . DNR (do not resuscitate) discussion   . Aspiration pneumonia of both lower lobes due to gastric secretions (HCC) 08/29/2017  . CKD (chronic kidney disease) stage 3, GFR 30-59 ml/min (HCC) 08/29/2017  . Hypercalcemia 08/29/2017  . Dehydration 08/29/2017  . HCAP (healthcare-associated pneumonia) 08/28/2017  . Osteoarthritis 03/10/2017  . CVA (cerebral vascular accident) (HCC) 07/10/2015  . Dry mouth 10/06/2011  . Encounter for screening colonoscopy 09/07/2011  . Hallucination 06/28/2011  . Ingrown toenail 06/28/2011  . Urinary incontinence 05/02/2011  . Constipation 05/02/2011  . Dysphagia 03/16/2011  . GAD (generalized anxiety disorder) 02/09/2011  . Depression 02/09/2011  . Abnormal sputum amount 12/19/2010  . GERD (gastroesophageal reflux disease)   . Cerebrovascular disease   . Macular degeneration   . Arteriosclerotic cardiovascular disease (ASCVD)   . Chronic kidney disease, stage 2, mildly decreased GFR   . Vascular dementia 02/06/2008  . Hyperlipidemia 11/01/2007  . Essential hypertension 11/01/2007   . BENIGN PROSTATIC HYPERTROPHY 11/01/2007    Orientation RESPIRATION BLADDER Height & Weight     Self, Situation  Normal Incontinent Weight: 122 lb 2.2 oz (55.4 kg) Height:  5\' 11"  (180.3 cm)  BEHAVIORAL SYMPTOMS/MOOD NEUROLOGICAL BOWEL NUTRITION STATUS      Incontinent (Dys 1. Fluid consistency: Honey Thick. PO medications whole in puree as able. Please provide magic cup on his trays)  AMBULATORY STATUS COMMUNICATION OF NEEDS Skin   Limited Assist(uses cane at baseline) Verbally Normal                       Personal Care Assistance Level of Assistance  Bathing, Feeding, Dressing Bathing Assistance: Limited assistance Feeding assistance: Independent Dressing Assistance: Limited assistance     Functional Limitations Info  Sight, Hearing, Speech Sight Info: Adequate Hearing Info: Adequate Speech Info: Adequate    SPECIAL CARE FACTORS FREQUENCY  Speech therapy             Speech Therapy Frequency: min 2x/week      Contractures Contractures Info: Not present    Additional Factors Info  Code Status, Allergies Code Status Info: DNR Allergies Info: REmeron           Current Medications (08/31/2017):  This is the current hospital active medication list Current Facility-Administered Medications  Medication Dose Route Frequency Provider Last Rate Last Dose  . 0.9 %  sodium chloride infusion   Intravenous Continuous Meredeth Ide, MD 75 mL/hr at 08/30/17 1236    . acetaminophen (TYLENOL) tablet 650 mg  650 mg Oral Q6H PRN Meredeth Ide, MD  Or  . acetaminophen (TYLENOL) suppository 650 mg  650 mg Rectal Q6H PRN Meredeth IdeLama, Gagan S, MD      . amoxicillin-clavulanate (AUGMENTIN) 875-125 MG per tablet 1 tablet  1 tablet Oral Q12H Philip AspenHernandez Acosta, Limmie PatriciaEstela Y, MD   1 tablet at 08/31/17 [phone]  . atorvastatin (LIPITOR) tablet 20 mg  20 mg Oral QHS Meredeth IdeLama, Gagan S, MD   20 mg at 08/30/17 2309  . carvedilol (COREG) tablet 3.125 mg  3.125 mg Oral BID WC Meredeth IdeLama, Gagan S, MD   3.125  mg at 08/31/17 0834  . clopidogrel (PLAVIX) tablet 75 mg  75 mg Oral QHS Meredeth IdeLama, Gagan S, MD   75 mg at 08/30/17 2309  . enoxaparin (LOVENOX) injection 40 mg  40 mg Subcutaneous Q24H Philip AspenHernandez Acosta, Limmie PatriciaEstela Y, MD      . famotidine (PEPCID) tablet 20 mg  20 mg Oral Daily Meredeth IdeLama, Gagan S, MD   20 mg at 08/31/17 0834  . lactulose (CHRONULAC) 10 GM/15ML solution 20 g  20 g Oral Daily PRN Meredeth IdeLama, Gagan S, MD      . multivitamin with minerals tablet 1 tablet  1 tablet Oral Daily Philip AspenHernandez Acosta, Limmie PatriciaEstela Y, MD   1 tablet at 08/31/17 [phone]  . ondansetron (ZOFRAN) tablet 4 mg  4 mg Oral Q6H PRN Meredeth IdeLama, Gagan S, MD       Or  . ondansetron [facility](ZOFRAN) injection 4 mg  4 mg Intravenous Q6H PRN Meredeth IdeLama, Gagan S, MD      . oxybutynin (DITROPAN) tablet 5 mg  5 mg Oral BID Meredeth IdeLama, Gagan S, MD   5 mg at 08/31/17 0834  . pantoprazole (PROTONIX) EC tablet 40 mg  40 mg Oral Daily Meredeth IdeLama, Gagan S, MD   40 mg at 08/31/17 0834  . QUEtiapine (SEROQUEL) tablet 25 mg  25 mg Oral QHS Meredeth IdeLama, Gagan S, MD   25 mg at 08/30/17 2309     Discharge Medications: Please see discharge summary for a list of discharge medications.  Relevant Imaging Results:  Relevant Lab Results:   Additional Information [id number]   Kaleigha Chamberlin D, LCSW

## 2017-09-01 LAB — COMPREHENSIVE METABOLIC PANEL
ALBUMIN: 2 g/dL — AB (ref 3.5–5.0)
ALK PHOS: 50 U/L (ref 38–126)
ALT: 54 U/L — ABNORMAL HIGH (ref 0–44)
ANION GAP: 6 (ref 5–15)
AST: 57 U/L — ABNORMAL HIGH (ref 15–41)
BILIRUBIN TOTAL: 0.3 mg/dL (ref 0.3–1.2)
BUN: 23 mg/dL (ref 8–23)
CALCIUM: 9.1 mg/dL (ref 8.9–10.3)
CO2: 24 mmol/L (ref 22–32)
Chloride: 115 mmol/L — ABNORMAL HIGH (ref 98–111)
Creatinine, Ser: 0.96 mg/dL (ref 0.61–1.24)
GFR calc Af Amer: >60 mL/min (ref >60)
GFR calc non Af Amer: >60 mL/min (ref >60)
Glucose, Bld: 104 mg/dL — ABNORMAL HIGH (ref 70–99)
Potassium: 3.3 mmol/L — ABNORMAL LOW (ref 3.5–5.1)
SODIUM: 145 mmol/L (ref 135–145)
TOTAL PROTEIN: 5.5 g/dL — AB (ref 6.5–8.1)

## 2017-09-01 LAB — IMMUNOFIXATION ELECTROPHORESIS
IgA: 308 mg/dL (ref 61–437)
IgG (Immunoglobin G), Serum: 1133 mg/dL (ref 700–1600)
IgM (Immunoglobulin M), Srm: 96 mg/dL (ref 15–143)
Total Protein ELP: 5.6 g/dL — ABNORMAL LOW (ref 6.0–8.5)

## 2017-09-01 MED ORDER — ALBUTEROL SULFATE (2.5 MG/3ML) 0.083% IN NEBU
INHALATION_SOLUTION | RESPIRATORY_TRACT | Status: AC
  Administered 2017-09-01: 2.5 mg via RESPIRATORY_TRACT
  Filled 2017-09-01: qty 3

## 2017-09-01 MED ORDER — ALBUTEROL SULFATE (2.5 MG/3ML) 0.083% IN NEBU
2.5000 mg | INHALATION_SOLUTION | Freq: Four times a day (QID) | RESPIRATORY_TRACT | Status: DC | PRN
  Administered 2017-09-01 – 2017-09-04 (×3): 2.5 mg via RESPIRATORY_TRACT
  Filled 2017-09-01 (×2): qty 3

## 2017-09-01 NOTE — Clinical Social Work Note (Addendum)
Beth [facility] patient. She stated that patient was oriented x3, sitting on the side of the bed eating. She stated that patient did not appear appropriate for the facility currently. She stated that they will reassess patient on Monday. Beth stated that for patient to come to the facility, he would have to be bed bound due to his legal history/issues.   LCSW left a message for Rober MinionLisa Prescott with [facility] to address referral sent on 08/31/17.    Judeth Gilles, Juleen ChinaHeather D, LCSW

## 2017-09-01 NOTE — Progress Notes (Signed)
Palliative:  [name] has, unfortunately, not been accepted at residential hospice.  Call to his daughter, Alex Williamson, to discuss disposition issues and plan of care.  Left detailed voicemail message.   Conference with hospitalist related to plan of care/disposition.   Conference with social worker related to plan of care/disposition. No return call from daughter Alex Williamson.  Continue discussions with social work related to disposition. 25 minutes Lillia Carmelasha Dove, NP Palliative Medicine Team Team Phone # [phone]

## 2017-09-01 NOTE — Progress Notes (Signed)
Patient seen and examined, lying in bed, appears more withdrawn.  After I saw him yesterday and we discharged him to skilled nursing, he had palliative care conference and his daughter preferred residential hospice placement.  Hospice has assessed him today and does not believe that he is appropriate for their facility.  Patient will remain hospitalized pending final disposition, however remains medically clear for discharge.  No changes to medications as of today.  Peggye PittEstela Hernandez, MD [facility] Pager: [phone]

## 2017-09-01 NOTE — Clinical Social Work Note (Signed)
Patient Information   Patient Name Alex Williamson, Alex Williamson ([id number]) Sex Male DOB [birth year] SSN [id number]   Room Bed  A308 A308-01  Patient Demographics   Address [address] [name] [address] Phone [phone] [facility]) [phone] (Mobile)  Patient Ethnicity & Race   Ethnic Group Patient Race  Not Hispanic or Latino White or Caucasian  Emergency Contact(s)   Name Relation Home Work Mobile  Costine,[name] Daughter [phone]    Maurilio Lovely Other [phone]  [phone]  March Rummage [phone]    Documents on File    Status Date Received Description  Documents for the Patient  EMR Medication Summary Not Received    EMR Immunization Summary Not Received    EMR Problem Summary Not Received    EMR Patient Summary Not Received    [place] HIPAA NOTICE OF PRIVACY - Scanned Received 07/26/10 [facility]  [place] E-Signature HIPAA Notice of Privacy Received 07/26/10   [place] E-Signature HIPAA Notice of Privacy Spanish Not Received    Driver's License Received 07/15/13   Insurance Card Received 09/16/13 BCBS COMPCOVERAGE/09/16/13/RJA/BHR  Advance Directives/Living Will/HCPOA/POA Not Received    [place] HIPAA NOTICE OF PRIVACY - Scanned Not Received    Insurance Card Not Received    Release of Information Not Received    Financial Application Not Received    Release of Information Not Received    AMB Correspondence Not Received  08/12 handi plac NCDMV   Release of Information Not Received    AMB Correspondence Not Received  06/12-06/10 Office Note- Knowl  AMB Correspondence Not Received  09/12 Office Note Suszanne Conners, Su  AMB HH/NH/Hospice Not Received  11/12 Face to Face [facility]  AMB Correspondence Not Received  05/12 Card [facility] CA/TH Surg  AMB HH/NH/Hospice Not Received  11/12 Order [facility]  AMB HH/NH/Hospice Not Received  12/12 Order [facility]   AMB HH/NH/Hospice Not Received  11/12 ORDER [facility]  AMB HH/NH/Hospice Not Received   11/12 OfficeNote [facility]  AMB HH/NH/Hospice Not Received  01/13 Order [facility]  AMB Correspondence Not Received  06/12 Office note [facility]   AMB HH/NH/Hospice Not Received  11/12 POC [facility]   AMB HH/NH/Hospice Not Received  01/13 Order [facility]  AMB HH/NH/Hospice Not Received  03/13 order [facility]  AMB HH/NH/Hospice Not Received  01/13 Episode Summary [facility]  AMB HH/NH/Hospice Not Received  03/13 ORDER [facility]  AMB HH/NH/Hospice Not Received  04/13 order [facility]  AMB HH/NH/Hospice Not Received  64/33 Cert/POC [facility]  AMB Correspondence Not Received  MMSE [place] MD, K   AMB HH/NH/Hospice Not Received  29/51 cert/poc [facility]  AMB HH/NH/Hospice Not Received  07/13 Order [facility]  Insurance Card Received 10/15/15 RAILROAD RETIREMENT/MEDICARE INSURANCE/09/16/13/RJA/BHR  Insurance Card Not Received    AMB HH/NH/Hospice Not Received  88/41 Cert/POC [facility]  AMB Patient Logs/Info Not Received    AMB HH/NH/Hospice Not Received  66/06 Cert/POC [facility]  AMB Correspondence Not Received  08/13 Office Note Teoh MD, S  AMB HH/NH/Hospice Not Received  episode sum report [facility]   AMB HH/NH/Hospice Not Received  30/16 cert/poc [facility]  AMB Correspondence Not Received  11/13 Summary Report [place] MD  AMB HH/NH/Hospice Not Received  01/14 Order [facility]  AMB Intake Forms/Questionnaires Not Received  11/13 Episode Summ Rpt [facility]  AMB HH/NH/Hospice Not Received  01/14 POC [facility]  AMB HH/NH/Hospice Not Received  01/09 Cert/POCT [facility]  AMB HH/NH/Hospice Not Received  01/14 order [facility]  Advance Directives/Living Will/HCPOA/POA Not Received    AMB Correspondence Not Received  01/14 Letter [facility]   AMB Correspondence Not Received  02/14 Order [place] MD, K   AMB HH/NH/Hospice Not Received  01/14 Order [facility]  AMB HH/NH/Hospice Not Received  Phys Order [facility]  AMB HH/NH/Hospice  Not Received  02/14 Order [facility]   AMB HH/NH/Hospice Not Received  03/14 DC [facility] [place]  AMB HH/NH/Hospice Not Received  03/14 Report [facility]   Advanced Beneficiary Notice (ABN) Not Received    Release of Information Not Received    Insurance Card Not Received    Insurance Card Not Received    Release of Information Received 10/01/12 dpr lbpulm  AMB Correspondence  12/24/12 10/14 pulm LB  HIM ROI Authorization  05/07/13   Release of Information  05/16/13   HIM ROI Authorization  07/22/13   [place] E-Signature HIPAA Notice of Privacy Signed 09/16/13 BILL OF RIGHTS SIGNED 09/16/13/RJA/BHR  Driver's License Received 10/15/15 DRIVERS GEXBMWU/1/32/44/WNU/UVOICENSE/09/16/13/RJA/BHR  Power of Attorney     Power of [employer]tt[employer]     Power of Attorney     AMB Correspondence  11/15/13 [facility] [place]  E-Signature AOB Spanish Not Received    AMB Correspondence  10/24/13 NOTICE OF DENIAL [facility] [facility]  AMB Correspondence  03/20/14 SOAP NOTE Philomena DohenyWOOI TEOH MD, SU  Other Photo ID  08/28/17   AMB Provider Completed Forms  09/09/14 [facility].  HIM ROI Authorization (Expired) 09/19/14 ED visit notes, labs, x-rays.  Release of Information  09/19/14   Insurance Card   medicaid  HIM ROI Authorization  01/19/15   HIM ROI Authorization  05/11/15   [place] E-Signature HIPAA Notice of Privacy Signed 10/15/15 10/15/15/PATIENT RIGHTS SIGNED  Release of Information Received 10/15/15 [name] COSTINE RELEASE/10/15/15  AMB Provider Completed Forms  01/29/16 FL2  AMB Correspondence  01/25/16 PHYS ORDER SHEET [facility]  AMB Provider Completed Forms  02/03/16 REPORT OF HEALTH SERVICES [place] DEPT OF [facility] RESOURC  AMB Correspondence  02/18/16 FL2 [facility]  [place] HIPAA NOTICE OF PRIVACY - Scanned  02/25/16   Release of Information  02/25/16   AMB Correspondence  04/05/16 1/18 PHYSICIAN ORDER SHEET [facility]  AMB Correspondence  03/21/16 ORDER  [facility]  AMB Provider Completed Forms  03/28/16 PHARMACIST COMMUNICATION TO PHYS [facility]  AMB Correspondence  06/07/16 3/18-4/18 PHYSICIANS ORDER SHEET [facility]  AMB HH/NH/Hospice  06/23/16 03/18-04/18 ORDER SHEET [facility]  AMB Correspondence  06/23/16 [facility]  AMB Provider Completed Forms  08/22/16 FL2  AMB Provider Completed Forms  08/10/16 COMMUNITY REFERRAL FORM [facility]  AMB Correspondence  08/16/16 ORDER [facility]  Insurance Card Received 03/10/17 [employer] 2018  AMB HH/NH/Hospice  09/23/16 07/18 PHYS ORDER SHEET [facility]  AMB HH/NH/Hospice  08/15/16 HOME HEALTH CERTIFICATION& PLAN OF CARE [facility]  AMB HH/NH/Hospice  09/05/16 ORDER [facility] [place]  AMB HH/NH/Hospice  10/26/16 ORDER [facility]  AMB Provider Completed Forms  08/10/16 FL2 [facility]  AMB HH/NH/Hospice  09/05/16 ORDER [facility]  AMB HH/NH/Hospice  08/15/16 HH CERTIFICATION/POC [facility]  AMB Correspondence Received 01/05/17 10/18 PHYSICIANS ORDER SHEET [facility]  AMB Correspondence Received 01/05/17 AUTHORIZATION CONTROLLED SUBSTANCE [facility]  AMB Correspondence Received 01/09/17 [facility]  AMB HH/NH/Hospice Received 10/26/16 ORDER [facility]  AMB Provider Completed Forms Received  03/15/17 [facility]  AMB HH/NH/Hospice Received 03/13/17 COMMUNITY REFERRAL FORM [facility]  AMB HH/NH/Hospice Received 03/21/17 CERTIFICATION & POC [facility] [place]  AMB HH/NH/Hospice Received 03/21/17 ORDER [facility] [place]  AMB HH/NH/Hospice Received 03/02/17 PHYSICIANS ORDER SHEET [facility]  AMB HH/NH/Hospice Received 03/17/17 CLIENT COORDINATION NOTE REPORT [facility]  AMB HH/NH/Hospice Received 03/13/17 DIET ORDER [facility]  AMB HH/NH/Hospice Received 03/16/17 CERTIFICATION/POC  [facility]  AMB HH/NH/Hospice Received 04/05/17 ORDER [facility]  AMB HH/NH/Hospice Received 04/26/17 ORDER [facility]  AMB HH/NH/Hospice Received 04/26/17 ORDER [facility]  AMB HH/NH/Hospice Received 03/16/17 EPISODE DETAIL REPORT [facility]  AMB HH/NH/Hospice Received 08/25/17 ORDERS [facility]  AMB Correspondence Received 08/25/17 LETTER [facility]  AMB HH/NH/Hospice Received 08/25/17 ORDERS [facility]  AMB Correspondence (Deleted) 11/27/10 05/12 Office Visit- [facility] CA/T  AMB HH/NH/Hospice (Deleted) 02/20/11 11/12 POC [facility]  AMB Patient Logs/Info (Deleted) 02/21/11 pt daughter writing  AMB HH/NH/Hospice (Deleted) 03/26/11 01/13 Order [facility]  AMB HH/NH/Hospice (Deleted) 16/10/96 04/54 Cert/POC/POT, [facility]   AMB Intake Forms/Questionnaires (Deleted) 07/05/11   AMB HH/NH/Hospice Not Received (Deleted)  09/81 Cert/POCT [facility]  AMB HH/NH/Hospice (Deleted) 05/17/12 03/14 Report [facility]  AMB HH/NH/Hospice (Deleted) 09/09/14 AP[facility]IC[facility] MEDICAL IN[facility].  AMB Correspondence (Deleted) 03/29/16 PHYSICIAN ORDER [facility]  AMB HH/NH/Hospice Received (Deleted) 03/17/17 01/19 DIET ORDER [facility]  Documents for the Encounter  AOB (Assignment of Insurance Benefits) Received 08/28/17 Assignment of Benefits  E-signature AOB     MEDICARE RIGHTS Received 08/28/17 Medicare Rights  E-signature Medicare Rights     Cardiac Monitoring Strip Shift Summary Received 08/28/17   ED Patient Billing Extract   ED PB Billing Extract  EKG Received 08/29/17   Study Attachment for Report   External Report  Admission Information   Attending Provider Admitting Provider Admission Type Admission Date/Time  Philip Aspen, Limmie Patricia, MD Meredeth Ide, MD Emergency 08/28/17 1847  Discharge Date Hospital Service Auth/Cert Status Service Area   Internal Medicine  Incomplete [facility]  Unit Room/Bed Admission Status   AP-DEPT 300 A308/A308-01 Admission (Confirmed)   Admission   Complaint  dehyrated,can't ate  Hospital Account   Name Acct ID Class Status Primary Coverage  [place], [place] [id number] Inpatient Open MEDICARE RAILROAD - MEDICARE RAILROAD      Guarantor Account (for Hospital Account [id number])   Name Relation to Pt Service Area Active? Acct Type  [name] Self [facility] Yes Personal/Family  Address Phone    [address] [place], [place] [address] [phone](H)        Coverage Information (for Hospital Account [id number])   1. MEDICARE RAILROAD/MEDICARE RAILROAD   F/O Payor/Plan Precert #  MEDICARE RAILROAD/MEDICARE RAILROAD   Subscriber Subscriber #  Nijee, [place] [id number]  Address Phone  [address] [address] [place], [place] [address]   2. MEDICAID [place]/MEDICAID OF [place]   F/O Payor/Plan Precert #  MEDICAID [place]/MEDICAID OF [place]   Subscriber Subscriber #  Rishon, [place] [id number]  Address Phone  [address] [address] [place], [place] [address] [phone]

## 2017-09-01 NOTE — Clinical Social Work Note (Signed)
Per Palliative Care NP request, [facility]  Patient's status was discussed with Cassandra at hospice.     Maninder Deboer, Juleen ChinaHeather D, LCSW

## 2017-09-01 NOTE — Care Management Important Message (Signed)
Important Message  Patient Details  Name: Alex Williamson MRN: [id number] Date of Birth: 01/16/1928   Medicare Important Message Given:  Yes    [name], Matalie Romberger Smith 09/01/2017, 11:49 AM

## 2017-09-02 LAB — CULTURE, BLOOD (ROUTINE X 2)
CULTURE: NO GROWTH
Culture: NO GROWTH
Special Requests: ADEQUATE
Special Requests: ADEQUATE

## 2017-09-02 NOTE — Progress Notes (Signed)
Patient seen and examined, database reviewed.  Appears generally unchanged but withdrawn.  Vital signs as follows:  Vitals:   09/02/17 1334 09/02/17 1600  BP: 134/75   Pulse: (!) 55   Resp: 16   Temp:  98.4 F (36.9 C)  SpO2: 98%    Discussed with daughter and son at bedside.  Patient was discharged on 6/27, has remained due to lack of bed availability.  There have been no significant changes over the past 2 days.  Patient will remain hospitalized pending final disposition, however remains medically clear for discharge.  No changes to medications as of today.  Peggye PittEstela Hernandez, MD [facility] Pager: [phone]

## 2017-09-03 NOTE — Progress Notes (Signed)
Patient seen and examined, database reviewed.  Discussed with son and daughter at bedside.  He appears generally unchanged but withdrawn.  Vital signs as follows: Vitals:   09/02/17 2101 09/03/17 0654  BP: 117/70 (!) 148/80  Pulse: (!) 58 (!) 52  Resp: 17 17  Temp: 98.7 F (37.1 C) (!) 97.3 F (36.3 C)  SpO2: 97% 97%   Patient was discharged on 6/27, remains hospitalized due to lack of bed availability.  Still unclear if he will be accepted to residential hospice or if we will need to pursue SNF.  No changes to medications as of today.  Peggye PittEstela Hernandez, MD [facility] Pager: [phone]

## 2017-09-03 NOTE — Progress Notes (Signed)
Held PM dose of Coreg due to BP 100/56 and HR 60. Pt asymptomatic. Dr. Ardyth HarpsHernandez paged and made aware. Will continue to monitor.

## 2017-09-04 LAB — CREATININE, SERUM
CREATININE: 0.91 mg/dL (ref 0.61–1.24)
GFR calc Af Amer: >60 mL/min (ref >60)
GFR calc non Af Amer: >60 mL/min (ref >60)

## 2017-09-04 NOTE — Progress Notes (Signed)
Pt's BP 108/74 and HR 55. Notified Dr. Ardyth HarpsHernandez and held evening dose of Coreg.

## 2017-09-04 NOTE — Clinical Social Work Note (Signed)
[facility] states that patient's referral was received and is being reviewed. She stated that she will contact LCSW tomorrow regarding bed availability.    Malisa Ruggiero, Juleen ChinaHeather D, LCSW

## 2017-09-04 NOTE — Progress Notes (Signed)
Palliative: Mr. Argentina DonovanHardee is sitting up in bed feeding himself chocolate ice cream.  Present today at bedside are his daughter [name], daughter from [place], daughter from [place] and her husband.   Of note, daughter from [place] began holding her cell phone in my direction.  I ask if she is recording, she states that she was.  I shared that it is inappropriate to record others without their consent.  She tells me that the recording is for her brother, so they get facts right.  She then asks if she may record, I consent. I share a diagram of the chronic illness pathway, what is normal and expected.  We talked about recurrent aspiration, dehydration, anticipated continued illness and decline.  We talk about disposition issues, residential hospice referral that will be coming later today and also referral to ALF.  I answered patient and family questions related to disposition to the best of my abilities, advised that SW will continue conversation, and have referred to SW.   Family states that if Mr. Argentina DonovanHardee is not accepted to residential hospice, then he be transferred to ALF with hospice.  Prognosis discussed with daughters with permission outside of room.  We also discussed the concept of let nature take its course. Conference with social worker related to plan of care, disposition. 45 minutes, extended time Lillia Carmelasha Aleyna Cueva, NP Palliative Medicine Team Team Phone # [phone]

## 2017-09-04 NOTE — Care Management Important Message (Signed)
Important Message  Patient Details  Name: Alex StackCharles R Williamson MRN: [id number] Date of Birth: 07/26/1927   Medicare Important Message Given:  Yes    [name], [name] 09/04/2017, 11:57 AM

## 2017-09-04 NOTE — Clinical Social Work Note (Signed)
[facility] clinical[facility]     Sehaj Kolden, Juleen ChinaHeather D, LCSW

## 2017-09-04 NOTE — Clinical Social Work Note (Signed)
Patient was reassessed by Hospice. They can only accept patient if he is non-ambulatory.    LCSW will continue to seek additional placement options.     Samiyyah Moffa, Juleen ChinaHeather D, LCSW

## 2017-09-04 NOTE — Progress Notes (Signed)
Patient seen and examined, database reviewed.  Discussed with 3 daughters and 1 son at bedside, updated on plan of care and all questions answered.  Patient is lying in bed, remains awake and alert, has no complaints.  Vitals are as follow:  Vitals:   09/03/17 2109 09/04/17 0606  BP: 127/71 (!) 154/76  Pulse: (!) 55 (!) 51  Resp: 16 16  Temp: 98.4 F (36.9 C) 97.9 F (36.6 C)  SpO2: 95% 96%   His evening dose of Coreg was held due to a blood pressure of 100/56, patient was asymptomatic.  Relative hypotension has resolved, I believe it is okay to resume his Coreg today.  Patient remains stable for discharge today, disposition remains unclear if he will be going to residential hospice versus SNF.  Peggye PittEstela Hernandez, MD [facility] Pager: [phone]

## 2017-09-05 MED ORDER — RESOURCE THICKENUP CLEAR PO POWD
ORAL | Status: DC | PRN
  Filled 2017-09-05: qty 125

## 2017-09-05 NOTE — Clinical Social Work Note (Signed)
CSW following. Spoke with Misty [facility]Lisa at [facility] this AM regarding pt's referral to their facility. Per Misty StanleyLisa, they do not have any male beds at this time and won't be able to accept pt unless something opens up.   Discussed pt with Palliative Care APNP who asks if [facility] will accept pt back if he is on hospice care and does not have order for thickened liquids. Per Tammy and RN Supervisor, Lupita LeashDonna, they do not take hospice patients at the facility. Per Tammy, pt needs a higher level of care now than they can provide.   Updated MD. SNF placement has been complicated due to pt's legal history. Will continue to try and find placement for pt.

## 2017-09-05 NOTE — Progress Notes (Signed)
Patient seen and examined, database reviewed.  Discussed with daughter at bedside, all questions answered.  It appears patient's disposition remains an issue.  Hospice has refused and the assisted living facility that we were told would take him has apparently declined due to lack of bed availability.  Patient remains stable.  Vital signs are as follow:  Vitals:   09/05/17 0556 09/05/17 1435  BP: 126/82 113/73  Pulse: (!) 57 60  Resp: 16 16  Temp: 97.7 F (36.5 C) 98.5 F (36.9 C)  SpO2: 91% 96%   Patient remains medically stable for discharge once disposition has been secured.  No changes to discharge summary dictated 6/27.  Peggye PittEstela Hernandez, MD [facility] Pager: [phone]

## 2017-09-06 DIAGNOSIS — J690 Pneumonitis due to inhalation of food and vomit: Principal | ICD-10-CM

## 2017-09-06 MED ORDER — MORPHINE SULFATE (CONCENTRATE) 10 MG/0.5ML PO SOLN: 2.6000 mg | ORAL | Status: DC | PRN

## 2017-09-06 MED ORDER — POLYVINYL ALCOHOL 1.4 % OP SOLN: 2.0000 [drp] | OPHTHALMIC | Status: DC | PRN

## 2017-09-06 NOTE — Progress Notes (Signed)
PROGRESS NOTE    Alex Williamson  WUJ:[id number] DOB: 07/20/1927 DOA: 08/28/2017 PCP: Salley Scarleturham, Kawanta F, MD   Brief Narrative:   [name] a89 y.o.male,with history of CVA, CAD, BPH, hypertension, hyperlipidemia, vascular dementia was brought to hospital from assisted living facility with complaints of generalized weakness. Patient is usually very interactive. As per caregiver patient has not been eating well since his lower teeth was removed a month ago. Usually patient is able to walk around and today he was having difficulty walking and the caregiver was concerned that patient may have some underlying illness. Patient says he has been coughing up phlegm and was admitted for aspiration pneumonia.    Assessment & Plan:   Active Problems:   Essential hypertension   HCAP (healthcare-associated pneumonia)   Aspiration pneumonia of both lower lobes due to gastric secretions (HCC)   CKD (chronic kidney disease) stage 3, GFR 30-59 ml/min (HCC)   Hypercalcemia   Dehydration   Failure to thrive in adult   Palliative care by specialist   Goals of care, counseling/discussion   DNR (do not resuscitate) discussion   Protein-calorie malnutrition, severe   Encounter for hospice care discussion   1. Aspiration pneumonia-recurrent.  Patient is on day 7/10 of Augmentin.  Palliative care following with patient and has discontinued statin with recommendations to continue carvedilol as well as PPI for comfort measures.  Continues on dysphagia 1 diet with honey thickened liquids.  Awaiting placement. 2. Essential hypertension.  Controlled.  Continue carvedilol. 3. Dyslipidemia.  Discontinue statin. 4. CAD.  Stable, continue carvedilol. 5. CKD stage III.  Creatinine has been at baseline.   DVT prophylaxis: None Code Status: DNR Family Communication: None Disposition Plan: Awaiting placement   Consultants:   Palliative Care  Procedures:   None  Antimicrobials:    Augmentin day 7/10.   Subjective: Patient seen and evaluated today with no new acute complaints or concerns. No acute concerns or events noted overnight.  Objective: Vitals:   09/05/17 1435 09/05/17 2128 09/06/17 0501 09/06/17 1522  BP: 113/73 122/73 (!) 99/57 101/67  Pulse: 60 60 60 67  Resp: 16 16 16 17   Temp: 98.5 F (36.9 C) 98.3 F (36.8 C) 97.8 F (36.6 C)   TempSrc: Oral Oral Oral   SpO2: 96% 96% 93% 99%  Weight:      Height:        Intake/Output Summary (Last 24 hours) at 09/06/2017 1602 Last data filed at 09/06/2017 1326 Gross per 24 hour  Intake 480 ml  Output -  Net 480 ml   Filed Weights   08/28/17 1801 08/28/17 2232  Weight: 56.7 kg (125 lb) 55.4 kg (122 lb 2.2 oz)    Examination:  General exam: Appears calm and comfortable  Respiratory system: Clear to auscultation. Respiratory effort normal. Cardiovascular system: S1 & S2 heard, RRR. No JVD, murmurs, rubs, gallops or clicks. No pedal edema. Gastrointestinal system: Abdomen is nondistended, soft and nontender. No organomegaly or masses felt. Normal bowel sounds heard. Central nervous system: Alert and oriented. No focal neurological deficits. Extremities: Symmetric 5 x 5 power. Skin: No rashes, lesions or ulcers    Data Reviewed: I have personally reviewed following labs and imaging studies  CBC: Recent Labs  Lab 08/31/17 0459  WBC 6.1  HGB 9.3*  HCT 29.3*  MCV 91.8  PLT 226   Basic Metabolic Panel: Recent Labs  Lab 08/31/17 0459 09/01/17 0717 09/04/17 0522  NA 145 145  --   K 3.3* 3.3*  --  CL 112* 115*  --   CO2 25 24  --   GLUCOSE 82 104*  --   BUN 24* 23  --   CREATININE 1.14 0.96 0.91  CALCIUM 9.4 9.1  --    GFR: Estimated Creatinine Clearance: 44 mL/min (by C-G formula based on SCr of 0.91 mg/dL). Liver Function Tests: Recent Labs  Lab 08/31/17 0459 09/01/17 0717  AST 39 57*  ALT 36 54*  ALKPHOS 50 50  BILITOT 0.4 0.3  PROT 5.9* 5.5*  ALBUMIN 2.2* 2.0*   No  results for input(s): LIPASE, AMYLASE in the last 168 hours. No results for input(s): AMMONIA in the last 168 hours. Coagulation Profile: No results for input(s): INR, PROTIME in the last 168 hours. Cardiac Enzymes: No results for input(s): CKTOTAL, CKMB, CKMBINDEX, TROPONINI in the last 168 hours. BNP (last 3 results) No results for input(s): PROBNP in the last 8760 hours. HbA1C: No results for input(s): HGBA1C in the last 72 hours. CBG: No results for input(s): GLUCAP in the last 168 hours. Lipid Profile: No results for input(s): CHOL, HDL, LDLCALC, TRIG, CHOLHDL, LDLDIRECT in the last 72 hours. Thyroid Function Tests: No results for input(s): TSH, T4TOTAL, FREET4, T3FREE, THYROIDAB in the last 72 hours. Anemia Panel: No results for input(s): VITAMINB12, FOLATE, FERRITIN, TIBC, IRON, RETICCTPCT in the last 72 hours. Sepsis Labs: No results for input(s): PROCALCITON, LATICACIDVEN in the last 168 hours.  Recent Results (from the past 240 hour(s))  Culture, blood (routine x 2)     Status: None   Collection Time: 08/28/17  8:59 PM  Result Value Ref Range Status   Specimen Description BLOOD LEFT ARM  Final   Special Requests   Final    BOTTLES DRAWN AEROBIC ONLY Blood Culture adequate volume   Culture   Final    NO GROWTH 5 DAYS Performed at [facility], [address] [place], [place] [address]    Report Status 09/02/2017 FINAL  Final  Culture, blood (routine x 2)     Status: None   Collection Time: 08/28/17  9:04 PM  Result Value Ref Range Status   Specimen Description BLOOD LEFT ARM  Final   Special Requests   Final    BOTTLES DRAWN AEROBIC ONLY Blood Culture adequate volume   Culture   Final    NO GROWTH 5 DAYS Performed at [facility], [address] [place], [place] [address]    Report Status 09/02/2017 FINAL  Final  MRSA PCR Screening     Status: None   Collection Time: 08/28/17 11:31 PM  Result Value Ref Range Status   MRSA by PCR NEGATIVE NEGATIVE Final     Comment:        The GeneXpert MRSA Assay (FDA approved for NASAL specimens only), is one component of a comprehensive MRSA colonization surveillance program. It is not intended to diagnose MRSA infection nor to guide or monitor treatment for MRSA infections. Performed at [facility], [address] [place], [place] [address]          Radiology Studies: No results found.      Scheduled Meds: . amoxicillin-clavulanate  1 tablet Oral Q12H  . carvedilol  3.125 mg Oral BID WC  . oxybutynin  5 mg Oral BID  . pantoprazole  40 mg Oral Daily  . QUEtiapine  25 mg Oral QHS   Continuous Infusions:   LOS: 9 days    Time spent: 30 minutes    Sherolyn Trettin Hoover Brunette, DO [facility]  Pager [phone]  If 7PM-7AM, please contact night-coverage [email or web address] Password [email or web address] 09/06/2017, 4:02 PM

## 2017-09-06 NOTE — Care Management Important Message (Signed)
Important Message  Patient Details  Name: Alex Williamson MRN: [id number] Date of Birth: 04/20/1927   Medicare Important Message Given:  Yes    [name], Shalyn Koral Smith 09/06/2017, 11:56 AM

## 2017-09-06 NOTE — Progress Notes (Addendum)
Palliative: Daughter Alex Williamson is leaving the family room and stops to ask about disposition plan.  I share that social worker continues to work toward placement, Child psychotherapistsocial worker will keep her updated on developments. Conversation with [name] in his room.  He is sitting up in the [place] chair at the window, daughter Alex Williamson is at his side with a granddaughter on video phone.  We talked about unburdening [name] from medications and treatments that are changing what is happening, or are painful.  [name] readily agrees.  Orders updated.   We briefly talked about disposition, that we are working to find him a safe place, where he can be cared for in the way that looks right to him.  [name] shares that he wishes that he could go to his daughter Alex Williamson's home.  He shares that she did a great job in getting him out of prison, and caring for him for about 10 years. Conference with social worker related to disposition plan. Conference with hospitalist related to symptom management, disposition plan. 40 minutes Alex Carmelasha Avamarie Crossley, NP Palliative Medicine Team Team Phone # [phone]

## 2017-09-06 NOTE — Progress Notes (Signed)
BP low - coreg dose held this evening.  MD made aware via text page

## 2017-09-06 NOTE — Clinical Social Work Note (Addendum)
LCSW discussed with patient and daughter, [name], issues concerning difficulty locating placement. Discussed that [facility] not [facility] and other facilities will not accept him on the ALF level because of his need for thickened liquids and his legal status and SNF level facilities will not accept due to his legal status. [name] stated that she would be willing to take patient home with her but her address is not an approved address due to patient's legal issues. LCSW stressed the importance of patient's family assisting with making a plan for patient.   LCSW spoke with Seward MethNicole Wood, [facility] Probation/Parole officer [phone]. She stated that she would speak with the family about making a plan for patient.    [name] contacted LCSW and stated that patient's daughter, [name], in [place], stated that she would begin working on making plans to take patient to her home. She stated that she would contact Hospice about providing care in her home.   LCSW will follow up with [name] regarding her ability to assist in patient's discharge plan.   LCSW left a message for [facility] at [facility] requesting return contact. [phone].

## 2017-09-06 NOTE — Progress Notes (Signed)
Nutrition Follow-up  DOCUMENTATION CODES:   Severe malnutrition in context of chronic illness, Underweight  INTERVENTION:   - Continue Magic cup TID with meals, each supplement provides 290 kcal and 9 grams of protein  - Continue MVI with minerals  - Encourage PO intake  NUTRITION DIAGNOSIS:   Severe Malnutrition related to chronic illness, dysphagia, poor appetite as evidenced by per patient/family report, moderate fat depletion, severe fat depletion, mild muscle depletion, severe muscle depletion, percent weight loss (19% weight loss in the past 6 months).  Ongoing, being addressed with oral nutrition supplements  GOAL:   Patient will meet greater than or equal to 90% of their needs (if feasible given his age and health status)  Unmet at this time, being addressed with appropriate diet and oral nutrition supplements  MONITOR:   PO intake, Weight trends, Labs, Supplement acceptance  REASON FOR ASSESSMENT:   Malnutrition Screening Tool    ASSESSMENT:   84 year old male who [facility] with hx of GERD, HTN, HLD, CKD-2, vascular dementia, macular degeneration, stroke, and dysphagia.  6/26 - MBS with recommendations for Dysphagia 1 diet and honey thick liquids  Discussed pt during interdisciplinary progression. Pt continues to await placement.  Noted pt with discharge summary in chart from 08/31/17. Per MD note yesterday, no changes since discharge summary.  Spoke with pt and daughter from [place] at time of visit. Per pt's daughter, pt is eating bites of the pureed food on his meal trays and always eating the dessert. When asked if he likes the [employer] Cup, pt shook his head. Pt's daughter states pt is eating the [employer] Cup.  Pt's daughter asked RD to make sure that pt is not sent anything that is chocolate flavored as it "makes him itchy." RD entered note into Health Touch software.  RD attempted to order additional oral nutrition supplements with meal trays.  However, Hormel shake is nectar-thick and pt requires honey-thick liquids.  Meal Completion: 0-50% since 6/30  Medications reviewed and include: 20 mg Pepcid daily, MVI with minerals daily, 40 mg Protonix daily  Labs reviewed (from 6/28): potassium 3.3 (L)  Diet Order:   Diet Order           Diet - low sodium heart healthy        DIET - DYS 1 Room service appropriate? Yes; Fluid consistency: Honey Thick  Diet effective now          EDUCATION NEEDS:   Not appropriate for education at this time  Skin:  Skin Assessment: Reviewed RN Assessment  Last BM:  09/03/17  Height:   Ht Readings from Last 1 Encounters:  08/28/17 5\' 11"  (1.803 m)    Weight:   Wt Readings from Last 1 Encounters:  08/28/17 122 lb 2.2 oz (55.4 kg)    Ideal Body Weight:  78 kg  BMI:  Body mass index is 17.03 kg/m.  Estimated Nutritional Needs:   Kcal:  1700-1900 kcal/day  Protein:  65-70 grams/day  Fluid:  >1400 ml daily    Earma ReadingKate Jablonski Shaundrea Carrigg, MS, RD, LDN Pager: [phone] Weekend/After Hours: [phone]

## 2017-09-07 NOTE — Progress Notes (Signed)
PROGRESS NOTE    Alex Williamson  WJX:[id number] DOB: 10/29/1927 DOA: 08/28/2017 PCP: Salley Scarlet, MD   Brief Narrative:   Alex Williamson a89 y.o.male,with history of CVA, CAD, BPH, hypertension, hyperlipidemia, vascular dementia was brought to hospital from assisted living facility with complaints of generalized weakness. Patient is usually very interactive. As per caregiver patient has not been eating well since his lower teeth was removed a month ago. Usually patient is able to walk around and today he was having difficulty walking and the caregiver was concerned that patient may have some underlying illness. Patient says he has been coughing up phlegm and was admitted for aspiration pneumonia.    Assessment & Plan:   Active Problems:   Essential hypertension   HCAP (healthcare-associated pneumonia)   Aspiration pneumonia of both lower lobes due to gastric secretions (HCC)   CKD (chronic kidney disease) stage 3, GFR 30-59 ml/min (HCC)   Hypercalcemia   Dehydration   Failure to thrive in adult   Palliative care by specialist   Goals of care, counseling/discussion   DNR (do not resuscitate) discussion   Protein-calorie malnutrition, severe   Encounter for hospice care discussion   1. Aspiration pneumonia-recurrent.  Patient is on day 8/10 of Augmentin.  Palliative care following with patient and has discontinued statin with recommendations to continue carvedilol as well as PPI for comfort measures.  Continues on dysphagia 1 diet with honey thickened liquids.  Likely plans for discharge to home with daughter and with hospital bed hopefully by a.m. 2. Essential hypertension.  Controlled.  Continue carvedilol. 3. Dyslipidemia.  Discontinue statin. 4. CAD.  Stable, continue carvedilol. 5. CKD stage III.  Creatinine has been at baseline.   DVT prophylaxis: None Code Status: DNR Family Communication: None Disposition Plan: Plans are to go home with his daughter once  arrangements have been made.   Consultants:   Palliative Care  Procedures:   None  Antimicrobials:   Augmentin day 8/10.   Subjective: Patient seen and evaluated today with no new acute complaints or concerns. No acute concerns or events noted overnight.  Objective: Vitals:   09/06/17 1628 09/06/17 2056 09/06/17 2235 09/07/17 0701  BP: 95/63  105/70 119/81  Pulse: 63  67 65  Resp: 18  13 15   Temp: 97.6 F (36.4 C)  98.9 F (37.2 C) 98 F (36.7 C)  TempSrc:   Oral Oral  SpO2: 96% 93% 95% 93%  Weight:      Height:        Intake/Output Summary (Last 24 hours) at 09/07/2017 1245 Last data filed at 09/07/2017 1159 Gross per 24 hour  Intake 600 ml  Output -  Net 600 ml   Filed Weights   08/28/17 1801 08/28/17 2232  Weight: 56.7 kg (125 lb) 55.4 kg (122 lb 2.2 oz)    Examination:  General exam: Appears calm and comfortable  Respiratory system: Clear to auscultation. Respiratory effort normal. Cardiovascular system: S1 & S2 heard, RRR. No JVD, murmurs, rubs, gallops or clicks. No pedal edema. Gastrointestinal system: Abdomen is nondistended, soft and nontender. No organomegaly or masses felt. Normal bowel sounds heard. Central nervous system: Alert and oriented. No focal neurological deficits. Extremities: Symmetric 5 x 5 power. Skin: No rashes, lesions or ulcers    Data Reviewed: I have personally reviewed following labs and imaging studies  CBC: No results for input(s): WBC, NEUTROABS, HGB, HCT, MCV, PLT in the last 168 hours. Basic Metabolic Panel: Recent Labs  Lab 09/01/17 0717 09/04/17  0522  NA 145  --   K 3.3*  --   CL 115*  --   CO2 24  --   GLUCOSE 104*  --   BUN 23  --   CREATININE 0.96 0.91  CALCIUM 9.1  --    GFR: Estimated Creatinine Clearance: 44 mL/min (by C-G formula based on SCr of 0.91 mg/dL). Liver Function Tests: Recent Labs  Lab 09/01/17 0717  AST 57*  ALT 54*  ALKPHOS 50  BILITOT 0.3  PROT 5.5*  ALBUMIN 2.0*   No  results for input(s): LIPASE, AMYLASE in the last 168 hours. No results for input(s): AMMONIA in the last 168 hours. Coagulation Profile: No results for input(s): INR, PROTIME in the last 168 hours. Cardiac Enzymes: No results for input(s): CKTOTAL, CKMB, CKMBINDEX, TROPONINI in the last 168 hours. BNP (last 3 results) No results for input(s): PROBNP in the last 8760 hours. HbA1C: No results for input(s): HGBA1C in the last 72 hours. CBG: No results for input(s): GLUCAP in the last 168 hours. Lipid Profile: No results for input(s): CHOL, HDL, LDLCALC, TRIG, CHOLHDL, LDLDIRECT in the last 72 hours. Thyroid Function Tests: No results for input(s): TSH, T4TOTAL, FREET4, T3FREE, THYROIDAB in the last 72 hours. Anemia Panel: No results for input(s): VITAMINB12, FOLATE, FERRITIN, TIBC, IRON, RETICCTPCT in the last 72 hours. Sepsis Labs: No results for input(s): PROCALCITON, LATICACIDVEN in the last 168 hours.  Recent Results (from the past 240 hour(s))  Culture, blood (routine x 2)     Status: None   Collection Time: 08/28/17  8:59 PM  Result Value Ref Range Status   Specimen Description BLOOD LEFT ARM  Final   Special Requests   Final    BOTTLES DRAWN AEROBIC ONLY Blood Culture adequate volume   Culture   Final    NO GROWTH 5 DAYS Performed at [facility], [address] [place], [place] [address]    Report Status 09/02/2017 FINAL  Final  Culture, blood (routine x 2)     Status: None   Collection Time: 08/28/17  9:04 PM  Result Value Ref Range Status   Specimen Description BLOOD LEFT ARM  Final   Special Requests   Final    BOTTLES DRAWN AEROBIC ONLY Blood Culture adequate volume   Culture   Final    NO GROWTH 5 DAYS Performed at [facility], [address] [place], [place] [address]    Report Status 09/02/2017 FINAL  Final  MRSA PCR Screening     Status: None   Collection Time: 08/28/17 11:31 PM  Result Value Ref Range Status   MRSA by PCR NEGATIVE NEGATIVE Final     Comment:        The GeneXpert MRSA Assay (FDA approved for NASAL specimens only), is one component of a comprehensive MRSA colonization surveillance program. It is not intended to diagnose MRSA infection nor to guide or monitor treatment for MRSA infections. Performed at [facility], [address] [place], [place] [address]          Radiology Studies: No results found.      Scheduled Meds: . oxybutynin  5 mg Oral BID  . pantoprazole  40 mg Oral Daily  . QUEtiapine  25 mg Oral QHS   Continuous Infusions:   LOS: 10 days    Time spent: 30 minutes    Amayrany Cafaro Hoover BrunetteD Nell Gales, DO [facility] Pager [phone]  If 7PM-7AM, please contact night-coverage [email or web address] Password [email or web address] 09/07/2017, 12:45 PM

## 2017-09-07 NOTE — Clinical Social Work Note (Signed)
CSW following. Pt's daughter, Alex Williamson, left a voicemail message stating that plan is for pt's other daughter, Alex Williamson, to take pt home with her. Alex Williamson indicated in her message that they were working on getting pt a hospital bed in the next couple days so that they could take pt home.  Updated RN CM who will follow up with pt's family to offer assistance with DME and possible hospice at home needs for pt.   LCSW will continue to be available if needed.

## 2017-09-07 NOTE — Progress Notes (Signed)
Sitting in chair by window.  Daughter, [name], present and says that she is taking patient home with her and has left message with Herbert SetaHeather.

## 2017-09-07 NOTE — Care Management (Signed)
CM left VM with daughter [name] offering assistance with hospice referral and getting hospital bed. CM left number for return call. CM will not make hospice referral or bed referral until the daughter is contacted to determine agency preference.

## 2017-09-08 ENCOUNTER — Telehealth: Payer: Self-pay | Admitting: Family Medicine

## 2017-09-08 MED ORDER — AMOXICILLIN-POT CLAVULANATE 875-125 MG PO TABS: 1.0000 | ORAL_TABLET | Freq: Two times a day (BID) | ORAL | 0 refills | Status: AC

## 2017-09-08 MED ORDER — MORPHINE SULFATE (CONCENTRATE) 10 MG/0.5ML PO SOLN: 2.6000 mg | ORAL | 0 refills | Status: DC | PRN

## 2017-09-08 MED ORDER — POLYVINYL ALCOHOL 1.4 % OP SOLN: 2.0000 [drp] | OPHTHALMIC | 0 refills | Status: AC | PRN

## 2017-09-08 NOTE — Care Management (Signed)
Per bedside RN, family has spoken to Hospice and daughters will transport patient home today.

## 2017-09-08 NOTE — Care Management (Signed)
Notified by CSW that [facility].  Patient will be going to [address], [place], [place].  Contact number for Hospice to call is [phone]. CM will send referral. Patient is ready for DC today.

## 2017-09-08 NOTE — Progress Notes (Signed)
Patient is to be discharged home and in stable condition. Patient and family given discharge instructions and verbalized understanding. All questions and concerns addressed and answered. Patient to be escorted out by staff via wheelchair.  Quita SkyeMorgan P Dishmon, RN

## 2017-09-08 NOTE — Telephone Encounter (Signed)
Larita FifeLynn from hospice calling to get orders for hospice care on this patient  [phone]

## 2017-09-08 NOTE — Care Management (Addendum)
Patient Information  SS# [id number] Patient Name Alex Williamson, Alex Williamson ([id number]) Sex Male DOB 1927/07/10  Room Bed  A308 A308-01  Patient Demographics   Address [address] [place] [address] Phone [phone] [facility]) [phone] (Mobile)  Patient Ethnicity & Race   Ethnic Group Patient Race  Not Hispanic or Latino White or Caucasian  Emergency Contact(s)   Name Relation Home Work Mobile  Costine,[name] Daughter [phone]    Jamesetta Orleans [phone]  [phone]  March Rummage [phone]    Documents on File    Status Date Received Description  Documents for the Patient  EMR Medication Summary Not Received    EMR Immunization Summary Not Received    EMR Problem Summary Not Received    EMR Patient Summary Not Received    [place] HIPAA NOTICE OF PRIVACY - Scanned Received 07/26/10 [facility]  [place] E-Signature HIPAA Notice of Privacy Received 07/26/10   [place] E-Signature HIPAA Notice of Privacy Spanish Not Received    Driver's License Received 07/15/13   Insurance Card Received 09/16/13 BCBS COMPCOVERAGE/09/16/13/RJA/BHR  Advance Directives/Living Will/HCPOA/POA Not Received    [place] HIPAA NOTICE OF PRIVACY - Scanned Not Received    Insurance Card Not Received    Release of Information Not Received    Financial Application Not Received    Release of Information Not Received    AMB Correspondence Not Received  08/12 handi plac NCDMV   Release of Information Not Received    AMB Correspondence Not Received  06/12-06/10 Office Note- Knowl  AMB Correspondence Not Received  09/12 Office Note Suszanne Conners, Su  AMB HH/NH/Hospice Not Received  11/12 Face to Face [facility]  AMB Correspondence Not Received  05/12 Card [facility] CA/TH Surg  AMB HH/NH/Hospice Not Received  11/12 Order [facility]  AMB HH/NH/Hospice Not Received  12/12 Order [facility]   AMB HH/NH/Hospice Not Received  11/12 ORDER [facility]  AMB HH/NH/Hospice Not Received   11/12 OfficeNote [facility]  AMB HH/NH/Hospice Not Received  01/13 Order [facility]  AMB Correspondence Not Received  06/12 Office note [facility]   AMB HH/NH/Hospice Not Received  11/12 POC [facility]   AMB HH/NH/Hospice Not Received  01/13 Order [facility]  AMB HH/NH/Hospice Not Received  03/13 order [facility]  AMB HH/NH/Hospice Not Received  01/13 Episode Summary [facility]  AMB HH/NH/Hospice Not Received  03/13 ORDER [facility]  AMB HH/NH/Hospice Not Received  04/13 order [facility]  AMB HH/NH/Hospice Not Received  16/01 Cert/POC [facility]  AMB Correspondence Not Received  MMSE Geary MD, K   AMB HH/NH/Hospice Not Received  09/32 cert/poc [facility]  AMB HH/NH/Hospice Not Received  07/13 Order [facility]  Insurance Card Received 10/15/15 RAILROAD RETIREMENT/MEDICARE INSURANCE/09/16/13/RJA/BHR  Insurance Card Not Received    AMB HH/NH/Hospice Not Received  35/57 Cert/POC [facility]  AMB Patient Logs/Info Not Received    AMB HH/NH/Hospice Not Received  32/20 Cert/POC [facility]  AMB Correspondence Not Received  08/13 Office Note Teoh MD, S  AMB HH/NH/Hospice Not Received  episode sum report [facility]   AMB HH/NH/Hospice Not Received  25/42 cert/poc [facility]  AMB Correspondence Not Received  11/13 Summary Report [place] MD  AMB HH/NH/Hospice Not Received  01/14 Order [facility]  AMB Intake Forms/Questionnaires Not Received  11/13 Episode Summ Rpt [facility]  AMB HH/NH/Hospice Not Received  01/14 POC [facility]  AMB HH/NH/Hospice Not Received  70/62 Cert/POCT [facility]  AMB HH/NH/Hospice Not Received  01/14 order [facility]  Advance Directives/Living Will/HCPOA/POA Not Received    AMB Correspondence Not Received  01/14 Letter [facility]   AMB Correspondence Not Received  02/14 Order [place] MD, K   AMB HH/NH/Hospice Not Received  01/14 Order [facility]  AMB HH/NH/Hospice Not Received  Phys Order [facility]  AMB HH/NH/Hospice  Not Received  02/14 Order [facility] [facility]   AMB HH/NH/Hospice Not Received  03/14 DC [facility] [place]  AMB HH/NH/Hospice Not Received  03/14 Report [facility]   Advanced Beneficiary Notice (ABN) Not Received    Release of Information Not Received    Insurance Card Not Received    Insurance Card Not Received    Release of Information Received 10/01/12 dpr lbpulm  AMB Correspondence  12/24/12 10/14 pulm LB  HIM ROI Authorization  05/07/13   Release of Information  05/16/13   HIM ROI Authorization  07/22/13   [place] E-Signature HIPAA Notice of Privacy Signed 09/16/13 BILL OF RIGHTS SIGNED 09/16/13/RJA/BHR  Driver's License Received 10/15/15 DRIVERS ZOXWRUE/4/54/09/WJX/BJYICENSE/09/16/13/RJA/BHR  Power of Attorney     Power of [employer]ttorney     Power of Attorney     AMB Correspondence  11/15/13 [facility] [place]  E-Signature AOB Spanish Not Received    AMB Correspondence  10/24/13 NOTICE OF DENIAL [facility] [facility]  AMB Correspondence  03/20/14 SOAP NOTE Philomena DohenyWOOI TEOH MD, SU  Other Photo ID  08/28/17   AMB Provider Completed Forms  09/09/14 [facility].  HIM ROI Authorization (Expired) 09/19/14 ED visit notes, labs, x-rays.  Release of Information  09/19/14   Insurance Card   medicaid  HIM ROI Authorization  01/19/15   HIM ROI Authorization  05/11/15   [place] E-Signature HIPAA Notice of Privacy Signed 10/15/15 10/15/15/PATIENT RIGHTS SIGNED  Release of Information Received 10/15/15 [name] COSTINE RELEASE/10/15/15  AMB Provider Completed Forms  01/29/16 FL2  AMB Correspondence  01/25/16 PHYS ORDER SHEET [facility]  AMB Provider Completed Forms  02/03/16 REPORT OF HEALTH SERVICES [place] DEPT OF HUMAN RESOURC  AMB Correspondence  02/18/16 FL2 [facility]  [place] HIPAA NOTICE OF PRIVACY - Scanned  02/25/16   Release of Information  02/25/16   AMB Correspondence  04/05/16 1/18 PHYSICIAN ORDER SHEET [facility]  AMB Correspondence  03/21/16 ORDER  [facility]  AMB Provider Completed Forms  03/28/16 PHARMACIST COMMUNICATION TO PHYS [facility]  AMB Correspondence  06/07/16 3/18-4/18 PHYSICIANS ORDER SHEET [facility]  AMB HH/NH/Hospice  06/23/16 03/18-04/18 ORDER SHEET [facility]  AMB Correspondence  06/23/16 [facility]  AMB Provider Completed Forms  08/22/16 FL2  AMB Provider Completed Forms  08/10/16 COMMUNITY REFERRAL FORM [facility]  AMB Correspondence  08/16/16 ORDER [facility]  Insurance Card Received 03/10/17 [employer] 2018  AMB HH/NH/Hospice  09/23/16 07/18 PHYS ORDER SHEET [facility]  AMB HH/NH/Hospice  08/15/16 HOME HEALTH CERTIFICATION& PLAN OF CARE [facility]  AMB HH/NH/Hospice  09/05/16 ORDER [facility] [place]  AMB HH/NH/Hospice  10/26/16 ORDER [facility]  AMB Provider Completed Forms  08/10/16 FL2 [facility]  AMB HH/NH/Hospice  09/05/16 ORDER [facility]  AMB HH/NH/Hospice  08/15/16 HH CERTIFICATION/POC [facility]  AMB Correspondence Received 01/05/17 10/18 PHYSICIANS ORDER SHEET [facility]  AMB Correspondence Received 01/05/17 AUTHORIZATION CONTROLLED SUBSTANCE [facility]  AMB Correspondence Received 01/09/17 [facility]  AMB HH/NH/Hospice Received 10/26/16 ORDER [facility]  AMB Provider Completed Forms Received 03/15/17 [facility] LONG TERM CARE  SERVICES  AMB HH/NH/Hospice Received 03/13/17 COMMUNITY REFERRAL FORM [facility]  AMB HH/NH/Hospice Received 03/21/17 CERTIFICATION & POC [facility] [place]  AMB HH/NH/Hospice Received 03/21/17 ORDER [facility] [place]  AMB HH/NH/Hospice Received 03/02/17 PHYSICIANS ORDER SHEET [facility]  AMB HH/NH/Hospice Received 03/17/17 CLIENT COORDINATION NOTE REPORT [facility]  AMB HH/NH/Hospice Received 03/13/17 DIET ORDER [facility]  AMB HH/NH/Hospice Received 03/16/17 CERTIFICATION/POC  [facility]  AMB HH/NH/Hospice Received 04/05/17 ORDER [facility]  AMB HH/NH/Hospice Received 04/26/17 ORDER [facility]  AMB HH/NH/Hospice Received 04/26/17 ORDER [facility]  AMB HH/NH/Hospice Received 03/16/17 EPISODE DETAIL REPORT [facility]  AMB HH/NH/Hospice Received 08/25/17 ORDERS [facility]  AMB Correspondence Received 08/25/17 LETTER [facility]  AMB HH/NH/Hospice Received 08/25/17 ORDERS [facility]  AMB Correspondence (Deleted) 11/27/10 05/12 Office Visit- [facility] CA/T  AMB HH/NH/Hospice (Deleted) 02/20/11 11/12 POC [facility]  AMB Patient Logs/Info (Deleted) 02/21/11 pt daughter writing  AMB HH/NH/Hospice (Deleted) 03/26/11 01/13 Order [facility]  AMB HH/NH/Hospice (Deleted) 09/81/19 14/78 Cert/POC/POT, [facility]   AMB Intake Forms/Questionnaires (Deleted) 07/05/11   AMB HH/NH/Hospice Not Received (Deleted)  29/56 Cert/POCT [facility]  AMB HH/NH/Hospice (Deleted) 05/17/12 03/14 Report [facility]  AMB HH/NH/Hospice (Deleted) 09/09/14 [facility] IN[facility].  AMB Correspondence (Deleted) 03/29/16 PHYSICIAN ORDER [facility]  AMB HH/NH/Hospice Received (Deleted) 03/17/17 01/19 DIET ORDER [facility]  Documents for the Encounter  AOB (Assignment of Insurance Benefits) Received 08/28/17 Assignment of Benefits  E-signature AOB     MEDICARE RIGHTS Received 08/28/17 Medicare Rights  E-signature Medicare Rights     Cardiac Monitoring Strip Shift Summary Received 08/28/17   ED Patient Billing Extract   ED PB Billing Extract  EKG Received 08/29/17   Study Attachment for Report   External Report  Admission Information   Attending Provider Admitting Provider Admission Type Admission Date/Time  Erick Blinks, DO Meredeth Ide, MD Emergency 08/28/17 1847  Discharge Date Hospital Service Auth/Cert Status Service Area   Internal Medicine Incomplete [facility]  Unit Room/Bed Admission Status   AP-DEPT 300 A308/A308-01 Admission (Confirmed)   Admission   Complaint  dehyrated,can't ate  Hospital Account   Name Acct ID Class Status Primary Coverage  Alex Williamson, Alex Williamson [id number] Inpatient Open MEDICARE RAILROAD - MEDICARE RAILROAD      Guarantor Account (for Hospital Account [id number])   Name Relation to Pt Service Area Active? Acct Type  Alex Williamson Self [facility] Yes Personal/Family  Address Phone    [address] [place], [place] [address] [phone](H)        Coverage Information (for Hospital Account [id number])   1. MEDICARE RAILROAD/MEDICARE RAILROAD   F/O Payor/Plan Precert #  MEDICARE RAILROAD/MEDICARE RAILROAD   Subscriber Subscriber #  Alex Williamson, Alex Williamson [id number]  Address Phone  [address] [address] [place], [place] [address]   2. MEDICAID [place]/MEDICAID [place]   F/O Payor/Plan Precert #  MEDICAID [place]/MEDICAID OF [place]   Subscriber Subscriber #  Alex Williamson, Alex Williamson [id number]  Address Phone  [address] [address] [place], [place] [address]

## 2017-09-11 MED ORDER — MORPHINE SULFATE (CONCENTRATE) 10 MG/0.5ML PO SOLN: 5.0000 mg | ORAL | 0 refills | Status: AC | PRN

## 2017-09-11 MED ORDER — LACTULOSE 20 GM/30ML PO SOLN: ORAL | 6 refills | Status: AC

## 2017-09-11 MED ORDER — RANITIDINE HCL 150 MG/10ML PO SYRP: 150.0000 mg | ORAL_SOLUTION | Freq: Two times a day (BID) | ORAL | 0 refills | Status: AC

## 2017-09-11 NOTE — Telephone Encounter (Signed)
Call [facility] to [facility], Librarian, academicexecutive director [facility]. LMTRC.

## 2017-09-11 NOTE — Telephone Encounter (Signed)
Received return call from DandridgeRose, Hospice SN [facility].   States that patient was D/C'ed home from hospital since his level of care has worsened and he is no longer appropriate for ALF. States that hospital attempted to assist with placement in SNF, but no local SNF was appropriate for patient d/t parolee status. Of note, patient still has ankle monitor in place.   Patient was admitted to Hospice services with Dx of protein calorie malnutrition. States that patient is not eating much as he is frequently aspirating. States that he has completed ABTx for PNA, but with frequent aspiration, he will most likely need frequent treatment for aspiration PNA.  Reports that the following medication changes will me made via Hospice Director: Change: Morphine Sulfate to increase to 0.1725mL from 0.4113mL PO Q4 hrs PRN- SOB Zantac tablet to liquid form Laculose decreased to 15mL PO QD PRN since patient refuses 30mL Add: [facility] Cough Syrup as needed for cough D/C: Coreg- states that patient stopped medication himself D/T hypotension Plavix Lipitor Prilosec MVI  MD to be made aware.

## 2017-09-12 NOTE — Telephone Encounter (Signed)
Spoke with patient daughter [name], hospice has come out, SW coming today to talk more about help at home and placement as she does not feel equipped to take of him Advised her to have SW call me after the visit  Agree with changes made

## 2017-09-22 ENCOUNTER — Telehealth: Payer: Self-pay | Admitting: *Deleted

## 2017-09-22 ENCOUNTER — Ambulatory Visit: Payer: MEDICARE | Admitting: Family Medicine

## 2017-09-22 NOTE — Telephone Encounter (Signed)
Received call from [name], [name] with Hospice.  Reports that patient passed away on August 17, 2017 @ 6:38am at home.   MD to be made aware.

## 2017-09-22 NOTE — Telephone Encounter (Signed)
noted 

## 2017-10-05 DEATH — deceased

## 2019-09-27 IMAGING — DX DG CHEST 2V
2 series · 2 of 2 positions shown · non-contrast
Comparison: 08/25/2017 chest radiograph.

CLINICAL DATA: Cough

EXAM:
CHEST - 2 VIEW

[chest lat]
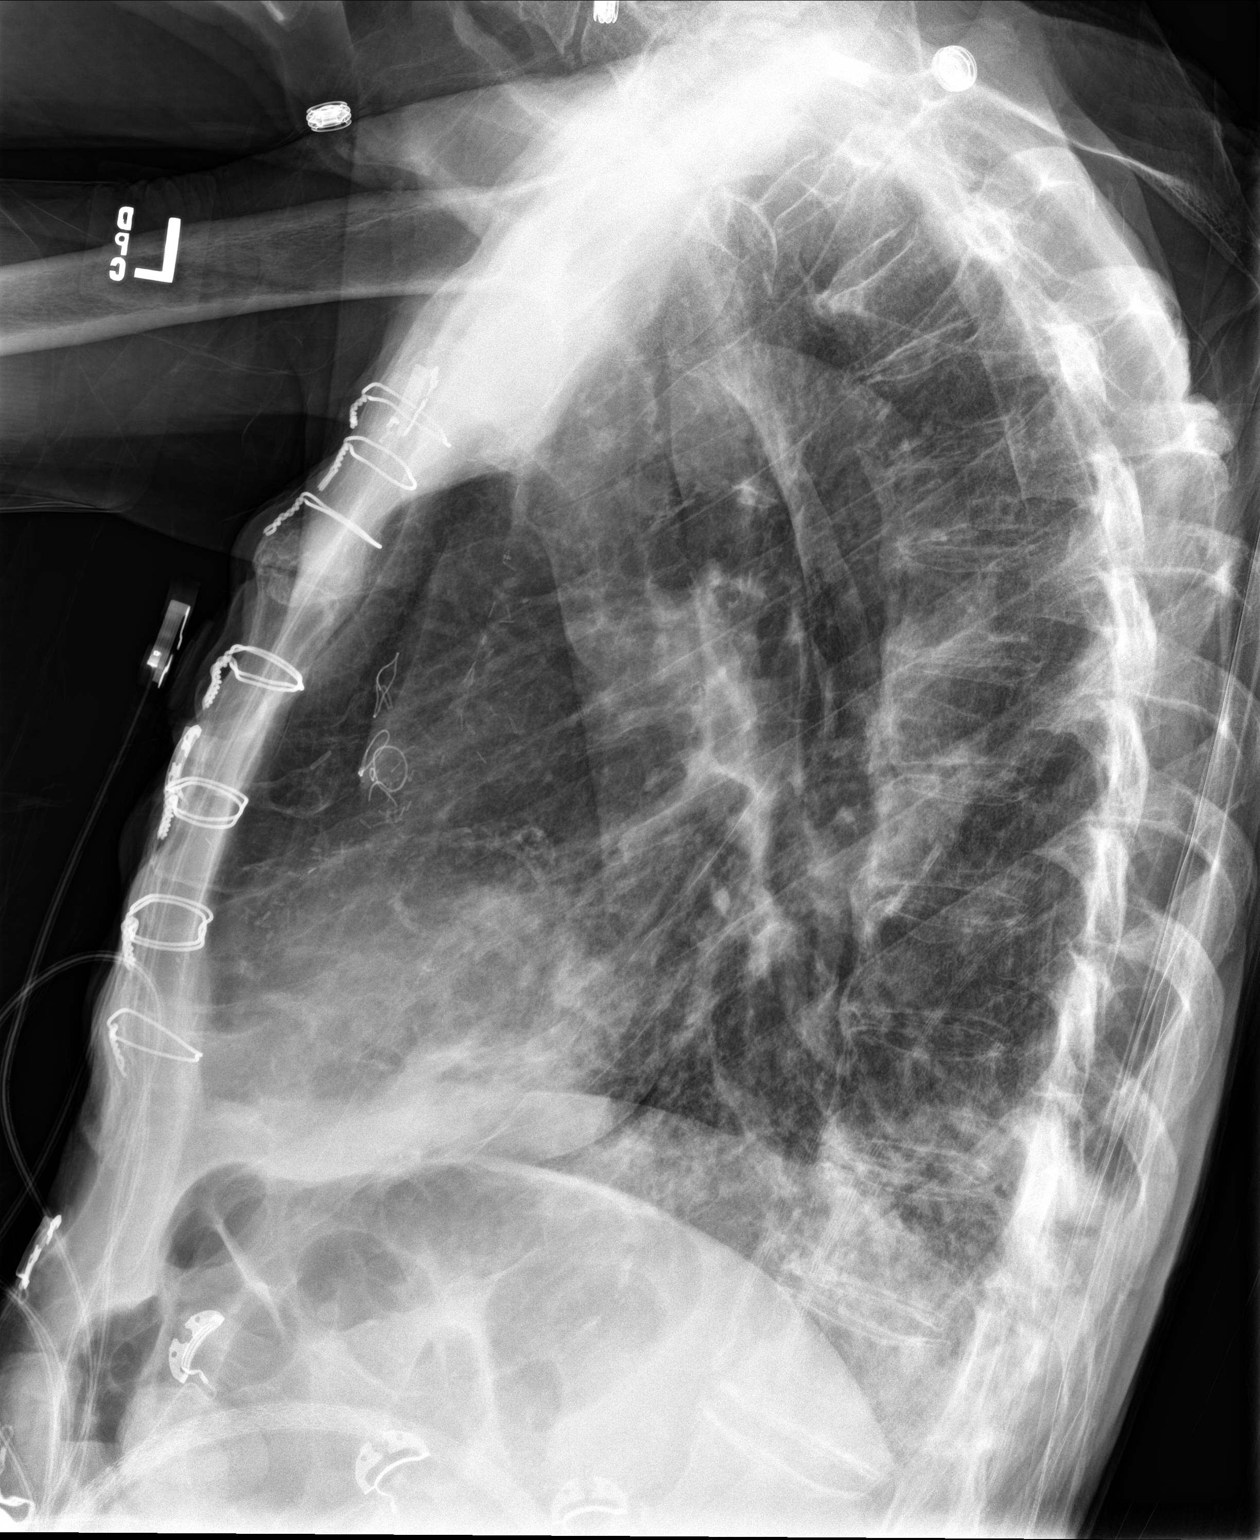

[chest ap]
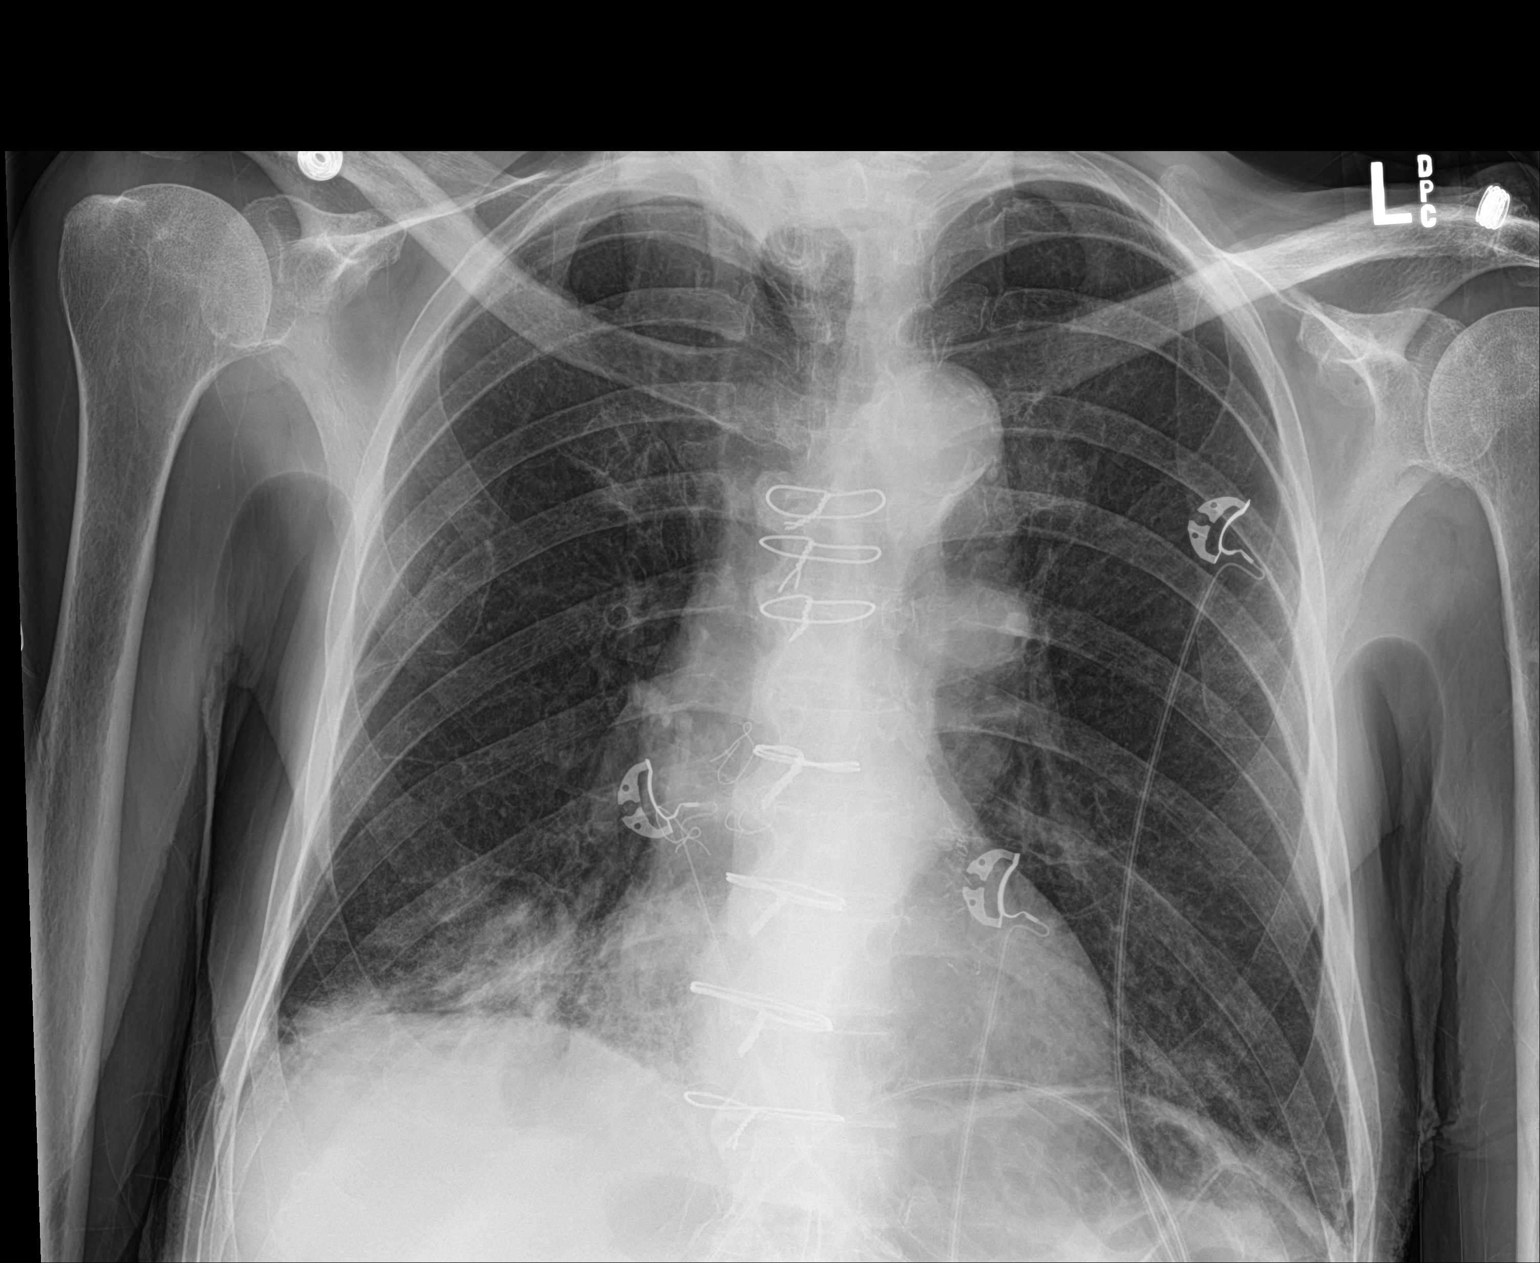

[2 of 2 positions shown; findings below may reference images not displayed]

FINDINGS: Intact sternotomy wires. Stable cardiomediastinal silhouette with
normal heart size. No pneumothorax. No pleural effusion. Patchy
opacity at the right greater than left lung bases, slightly
increased on the right. No pulmonary edema.
IMPRESSION: Patchy opacity at the right greater than left lung bases, slightly
increased on the right, suspect multilobar pneumonia, potentially
due to aspiration. Recommend follow-up chest radiographs to
resolution.
